# Patient Record
Sex: Male | Born: 1940
Health system: Southern US, Community
[De-identification: ages and names within clinical notes are randomized; demographics above are authoritative.]

## PROBLEM LIST (undated history)

## (undated) DIAGNOSIS — M1990 Unspecified osteoarthritis, unspecified site: Secondary | ICD-10-CM

## (undated) DIAGNOSIS — R519 Headache, unspecified: Secondary | ICD-10-CM

## (undated) DIAGNOSIS — I420 Dilated cardiomyopathy: Secondary | ICD-10-CM

## (undated) DIAGNOSIS — I480 Paroxysmal atrial fibrillation: Secondary | ICD-10-CM

## (undated) DIAGNOSIS — I340 Nonrheumatic mitral (valve) insufficiency: Secondary | ICD-10-CM

## (undated) DIAGNOSIS — R51 Headache: Secondary | ICD-10-CM

## (undated) DIAGNOSIS — I351 Nonrheumatic aortic (valve) insufficiency: Secondary | ICD-10-CM

## (undated) DIAGNOSIS — G459 Transient cerebral ischemic attack, unspecified: Secondary | ICD-10-CM

## (undated) DIAGNOSIS — K219 Gastro-esophageal reflux disease without esophagitis: Secondary | ICD-10-CM

## (undated) DIAGNOSIS — D508 Other iron deficiency anemias: Secondary | ICD-10-CM

## (undated) DIAGNOSIS — I491 Atrial premature depolarization: Secondary | ICD-10-CM

## (undated) DIAGNOSIS — I77810 Thoracic aortic ectasia: Secondary | ICD-10-CM

## (undated) DIAGNOSIS — I77819 Aortic ectasia, unspecified site: Secondary | ICD-10-CM

## (undated) HISTORY — PX: CATARACT EXTRACTION: SUR2

## (undated) HISTORY — PX: HERNIA REPAIR: SHX51

## (undated) HISTORY — DX: Nonrheumatic mitral (valve) insufficiency: I34.0

## (undated) HISTORY — DX: Dilated cardiomyopathy: I42.0

## (undated) HISTORY — DX: Gastro-esophageal reflux disease without esophagitis: K21.9

## (undated) HISTORY — DX: Transient cerebral ischemic attack, unspecified: G45.9

## (undated) HISTORY — DX: Aortic ectasia, unspecified site: I77.819

## (undated) HISTORY — DX: Other iron deficiency anemias: D50.8

## (undated) HISTORY — DX: Paroxysmal atrial fibrillation: I48.0

## (undated) HISTORY — DX: Thoracic aortic ectasia: I77.810

## (undated) HISTORY — DX: Nonrheumatic aortic (valve) insufficiency: I35.1

## (undated) HISTORY — PX: TONSILLECTOMY: SUR1361

---

## 2000-10-14 ENCOUNTER — Encounter: Payer: Self-pay | Admitting: Allergy and Immunology

## 2000-10-14 ENCOUNTER — Ambulatory Visit (HOSPITAL_COMMUNITY): Admission: RE | Admit: 2000-10-14 | Discharge: 2000-10-14 | Payer: Self-pay | Admitting: *Deleted

## 2004-02-06 ENCOUNTER — Emergency Department (HOSPITAL_COMMUNITY): Admission: EM | Admit: 2004-02-06 | Discharge: 2004-02-06 | Payer: Self-pay | Admitting: Family Medicine

## 2005-10-27 ENCOUNTER — Emergency Department (HOSPITAL_COMMUNITY): Admission: EM | Admit: 2005-10-27 | Discharge: 2005-10-27 | Payer: Self-pay | Admitting: Family Medicine

## 2005-11-06 ENCOUNTER — Emergency Department (HOSPITAL_COMMUNITY): Admission: EM | Admit: 2005-11-06 | Discharge: 2005-11-06 | Payer: Self-pay | Admitting: Family Medicine

## 2007-02-16 ENCOUNTER — Encounter: Admission: RE | Admit: 2007-02-16 | Discharge: 2007-02-16 | Payer: Self-pay | Admitting: Allergy and Immunology

## 2007-02-16 IMAGING — CR DG CHEST 2V
2 series · 2 of 2 positions shown · non-contrast
Comparison: none

CLINICAL DATA: Cough. 
 CHEST X-RAY: 
 Two views of the chest show the lungs to be clear.  The heart is within upper limits of normal.  No bony abnormality is seen.

[view not recorded (1 of 2)]
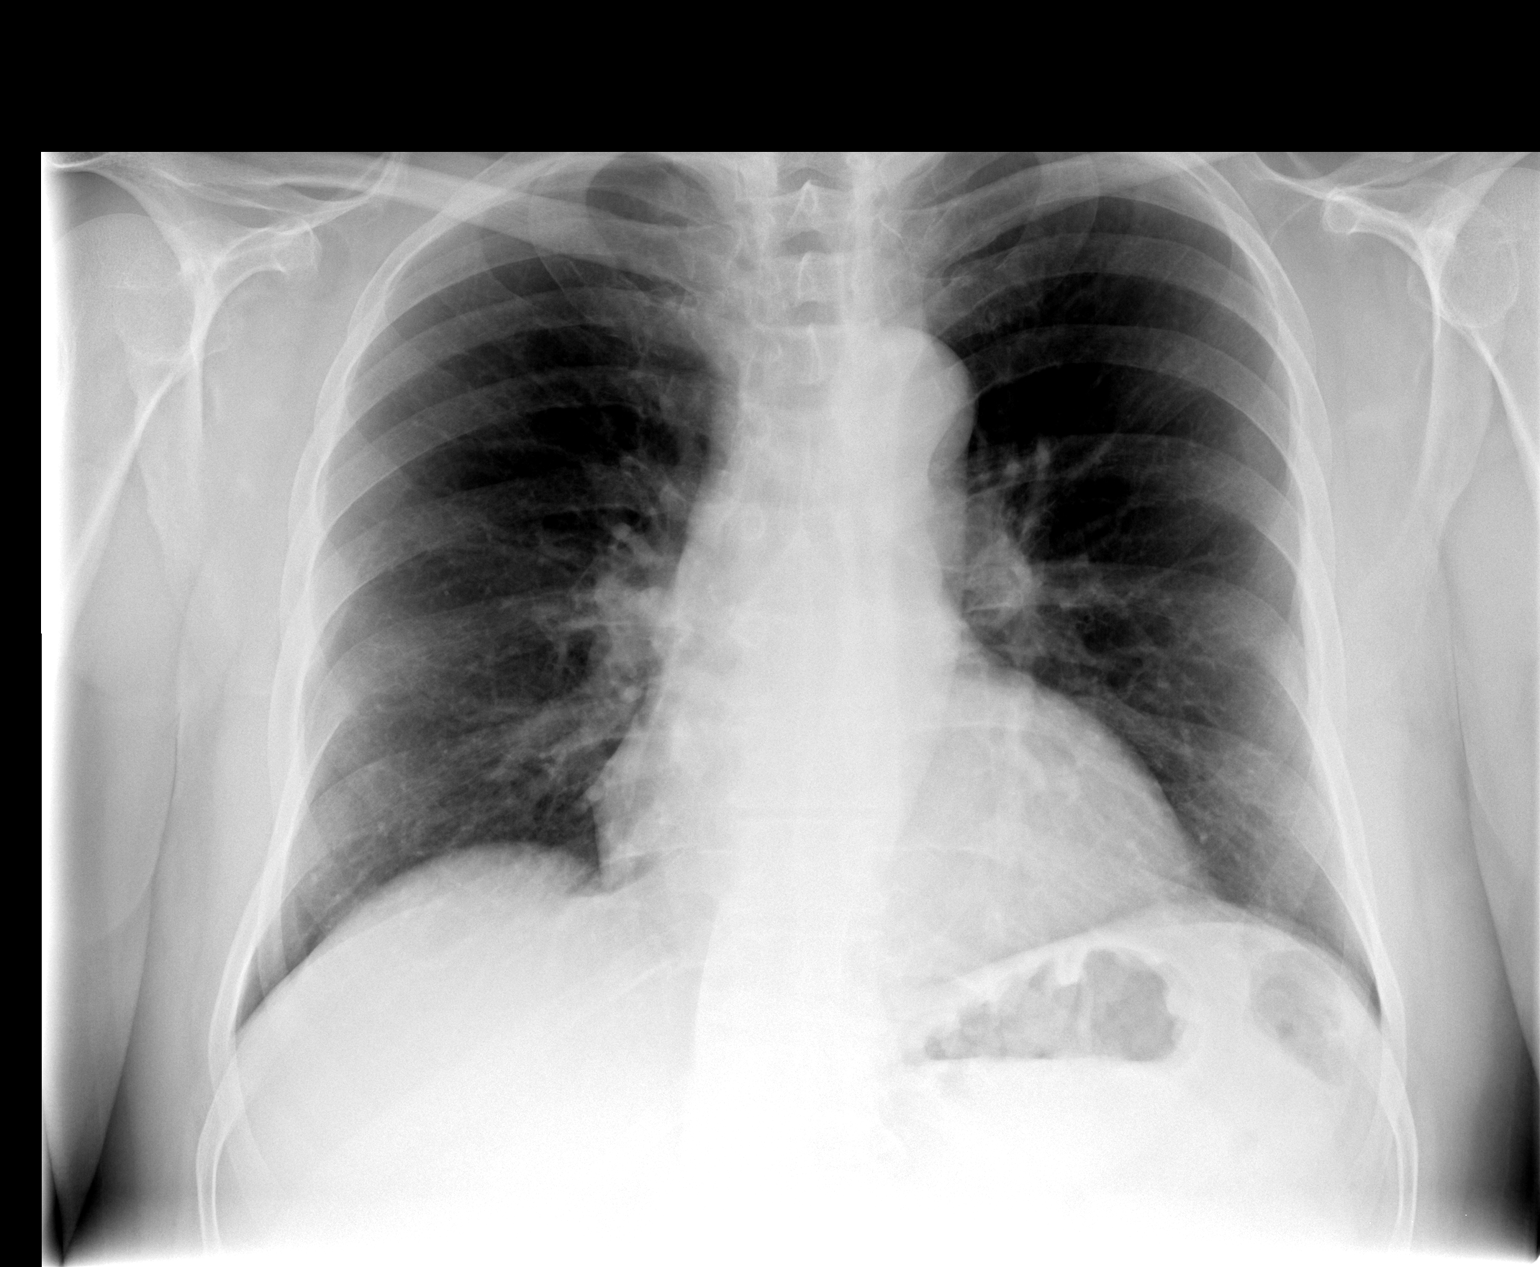

[view not recorded (2 of 2)]
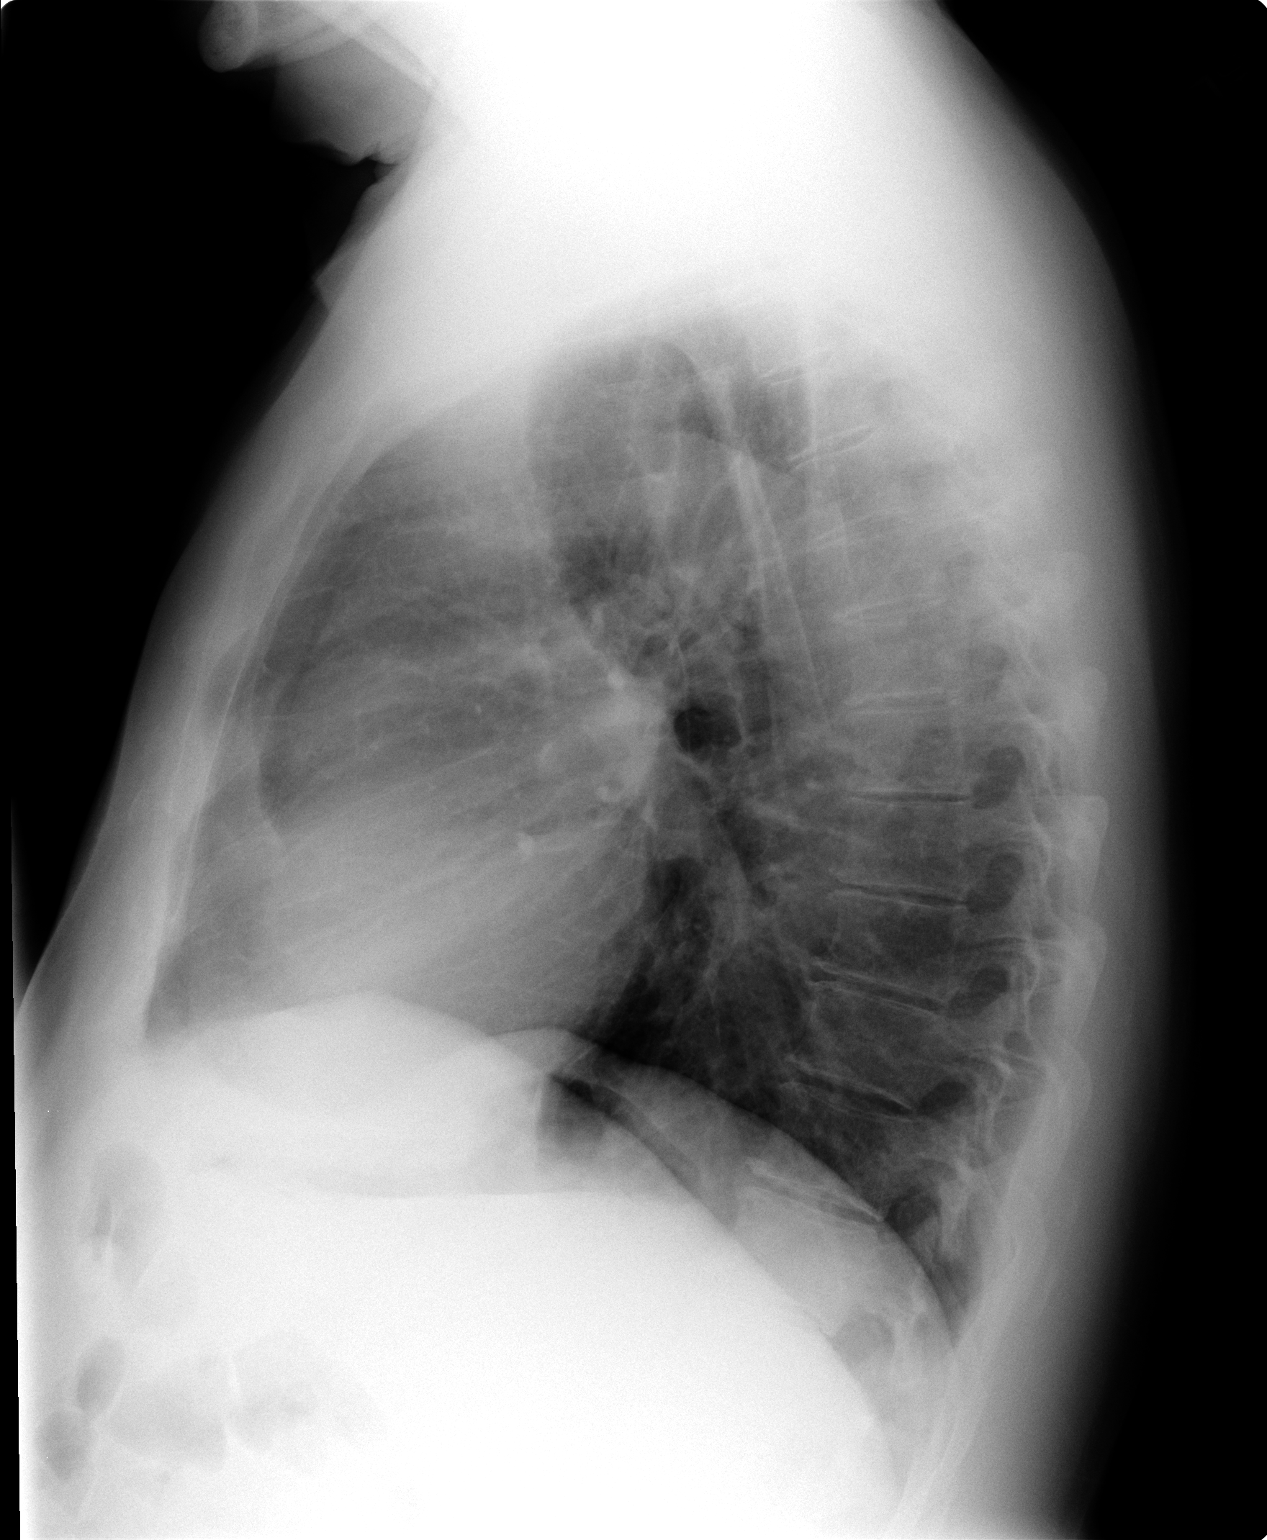

[2 of 2 positions shown; findings below may reference images not displayed]

IMPRESSION: No active lung disease.

## 2007-02-24 ENCOUNTER — Emergency Department (HOSPITAL_COMMUNITY): Admission: EM | Admit: 2007-02-24 | Discharge: 2007-02-24 | Payer: Self-pay | Admitting: Family Medicine

## 2007-11-15 ENCOUNTER — Emergency Department (HOSPITAL_COMMUNITY): Admission: EM | Admit: 2007-11-15 | Discharge: 2007-11-15 | Payer: Self-pay | Admitting: Family Medicine

## 2009-04-15 ENCOUNTER — Ambulatory Visit: Payer: Self-pay | Admitting: Oncology

## 2009-04-18 LAB — CBC & DIFF AND RETIC
Basophils Absolute: 0 10*3/uL (ref 0.0–0.1)
HCT: 25.9 % — ABNORMAL LOW (ref 38.4–49.9)
HGB: 7.1 g/dL — ABNORMAL LOW (ref 13.0–17.1)
Immature Retic Fract: 14.6 % — ABNORMAL HIGH (ref 0.00–13.40)
MCH: 16.5 pg — ABNORMAL LOW (ref 27.2–33.4)
MONO#: 0.4 10*3/uL (ref 0.1–0.9)
NEUT%: 62.4 % (ref 39.0–75.0)
Platelets: 145 10*3/uL (ref 140–400)
Retic Ct Abs: 29.31 10*3/uL (ref 24.10–77.50)
WBC: 4.7 10*3/uL (ref 4.0–10.3)
lymph#: 1.1 10*3/uL (ref 0.9–3.3)

## 2009-04-18 LAB — CHCC SMEAR

## 2009-04-18 LAB — MORPHOLOGY

## 2009-04-22 LAB — FOLATE: Folate: 11.9 ng/mL

## 2009-04-22 LAB — PROTEIN ELECTROPHORESIS, SERUM
Alpha-2-Globulin: 9.4 % (ref 7.1–11.8)
Beta 2: 5.1 % (ref 3.2–6.5)
Gamma Globulin: 12.8 % (ref 11.1–18.8)

## 2009-04-22 LAB — HEMOGLOBINOPATHY EVALUATION
Hemoglobin Other: 0 % (ref 0.0–0.0)
Hgb A2 Quant: 1.7 % — ABNORMAL LOW (ref 2.2–3.2)
Hgb F Quant: 0 % (ref 0.0–2.0)
Hgb S Quant: 0 % (ref 0.0–0.0)

## 2009-04-22 LAB — COMPREHENSIVE METABOLIC PANEL
ALT: <8 U/L (ref 0–53)
AST: 10 U/L (ref 0–37)
CO2: 23 mEq/L (ref 19–32)
Chloride: 107 mEq/L (ref 96–112)
Sodium: 141 mEq/L (ref 135–145)
Total Bilirubin: 0.7 mg/dL (ref 0.3–1.2)
Total Protein: 6.3 g/dL (ref 6.0–8.3)

## 2009-04-22 LAB — IRON AND TIBC
Iron: 12 ug/dL — ABNORMAL LOW (ref 42–165)
TIBC: 395 ug/dL (ref 215–435)
UIBC: 383 ug/dL

## 2009-04-22 LAB — DIRECT ANTIGLOBULIN TEST (NOT AT ARMC)
DAT (Complement): NEGATIVE
DAT IgG: NEGATIVE

## 2009-04-22 LAB — KAPPA/LAMBDA LIGHT CHAINS: Kappa free light chain: 1.41 mg/dL (ref 0.33–1.94)

## 2009-04-22 LAB — HAPTOGLOBIN: Haptoglobin: 119 mg/dL (ref 16–200)

## 2009-04-22 LAB — LACTATE DEHYDROGENASE: LDH: 117 U/L (ref 94–250)

## 2009-07-15 ENCOUNTER — Ambulatory Visit: Payer: Self-pay | Admitting: Oncology

## 2009-07-17 LAB — CBC WITH DIFFERENTIAL/PLATELET
BASO%: 0.7 % (ref 0.0–2.0)
EOS%: 3.4 % (ref 0.0–7.0)
HCT: 27.6 % — ABNORMAL LOW (ref 38.4–49.9)
MCH: 16.8 pg — ABNORMAL LOW (ref 27.2–33.4)
MCHC: 27.2 g/dL — ABNORMAL LOW (ref 32.0–36.0)
NEUT%: 65.5 % (ref 39.0–75.0)
RBC: 4.47 10*6/uL (ref 4.20–5.82)
lymph#: 1.4 10*3/uL (ref 0.9–3.3)
nRBC: 0 % (ref 0–0)

## 2009-09-23 ENCOUNTER — Emergency Department (HOSPITAL_COMMUNITY): Admission: EM | Admit: 2009-09-23 | Discharge: 2009-09-23 | Payer: Self-pay | Admitting: Family Medicine

## 2009-10-10 ENCOUNTER — Ambulatory Visit (HOSPITAL_COMMUNITY): Admission: RE | Admit: 2009-10-10 | Discharge: 2009-10-10 | Payer: Self-pay | Admitting: Gastroenterology

## 2009-10-21 ENCOUNTER — Ambulatory Visit: Payer: Self-pay | Admitting: Oncology

## 2009-10-22 ENCOUNTER — Ambulatory Visit (HOSPITAL_COMMUNITY): Admission: RE | Admit: 2009-10-22 | Discharge: 2009-10-22 | Payer: Self-pay | Admitting: Allergy and Immunology

## 2009-10-22 IMAGING — CR DG CHEST 2V
2 series · 2 of 2 positions shown · non-contrast
Comparison: [DATE] study.

CLINICAL DATA: History of coughing.  History of asthma.

CHEST - 2 VIEW

[w chest pa]
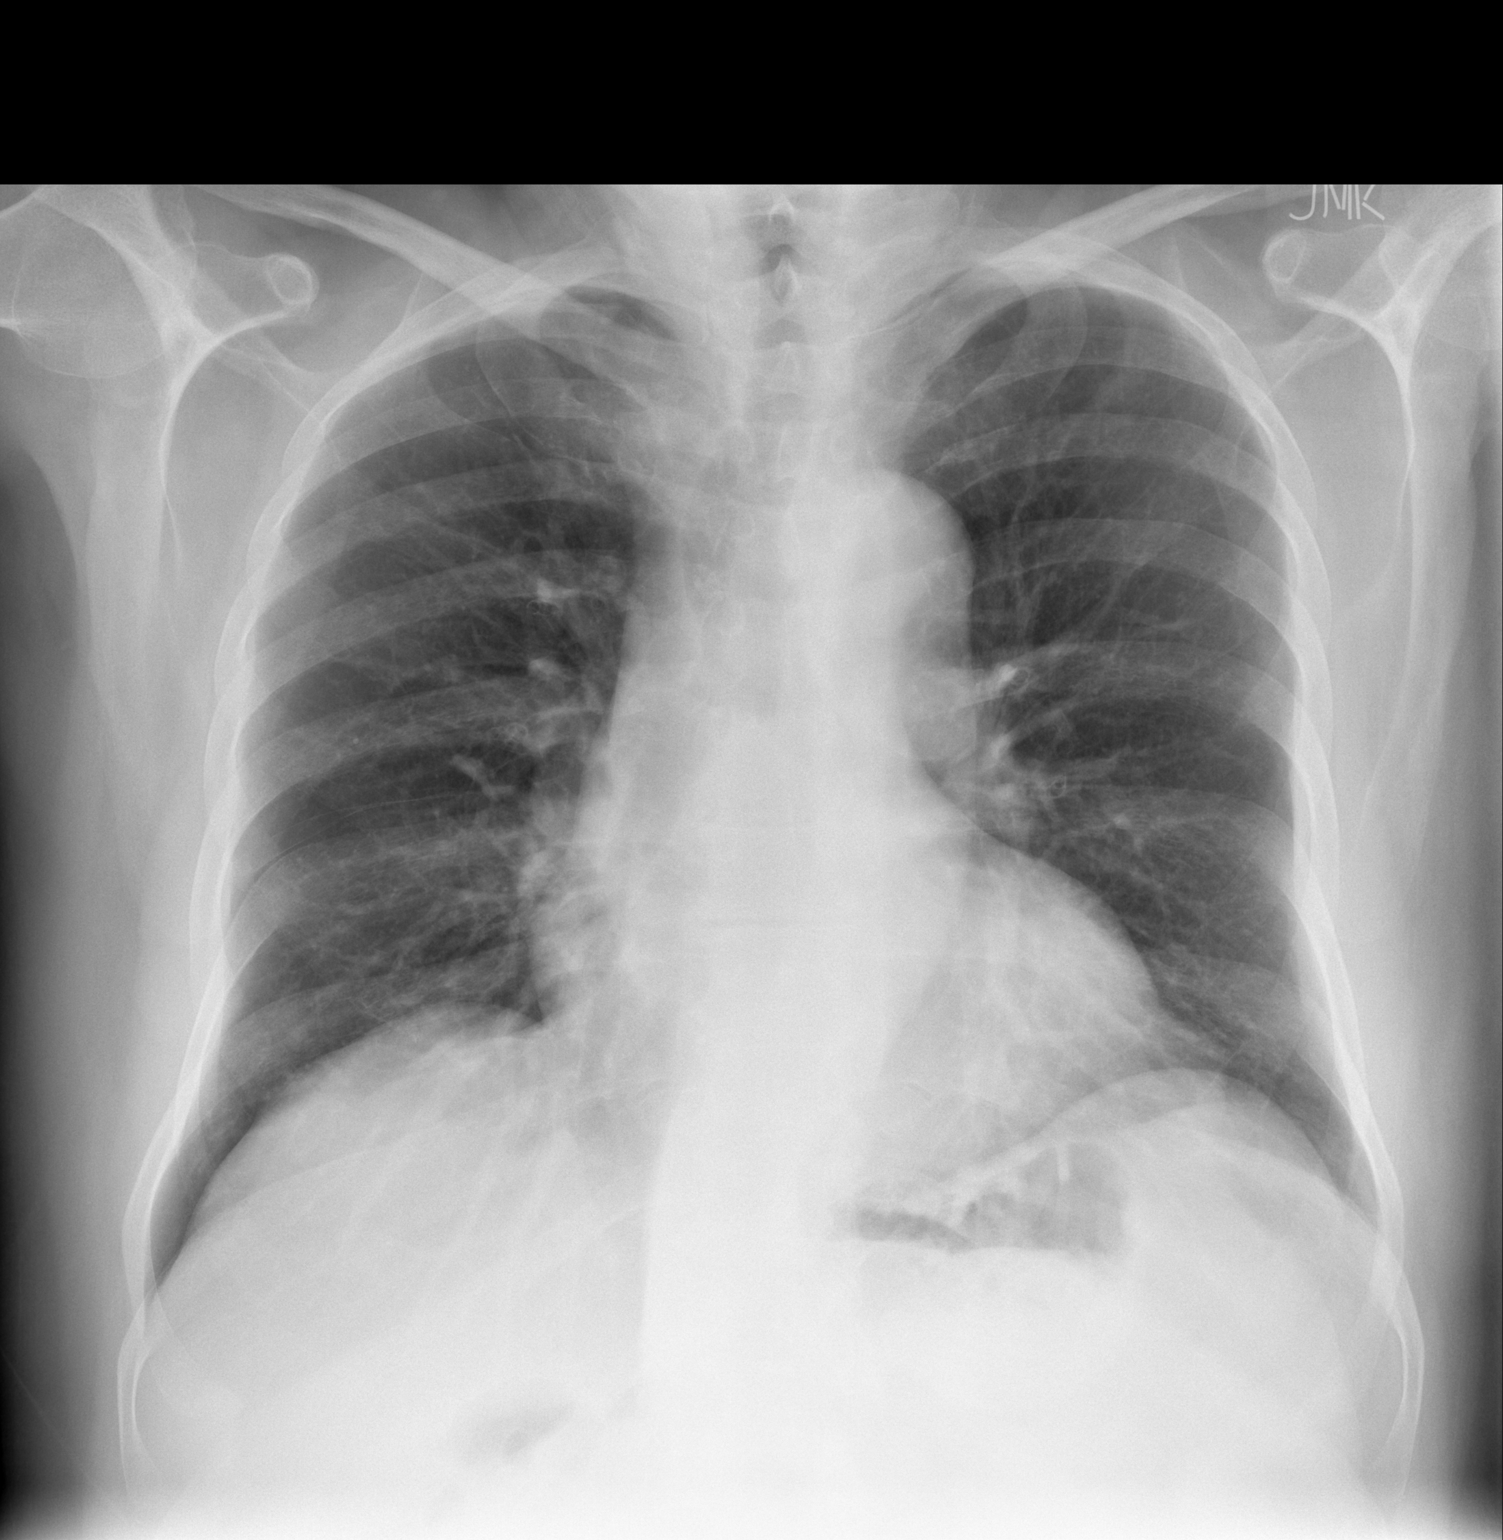

[w chest lat]
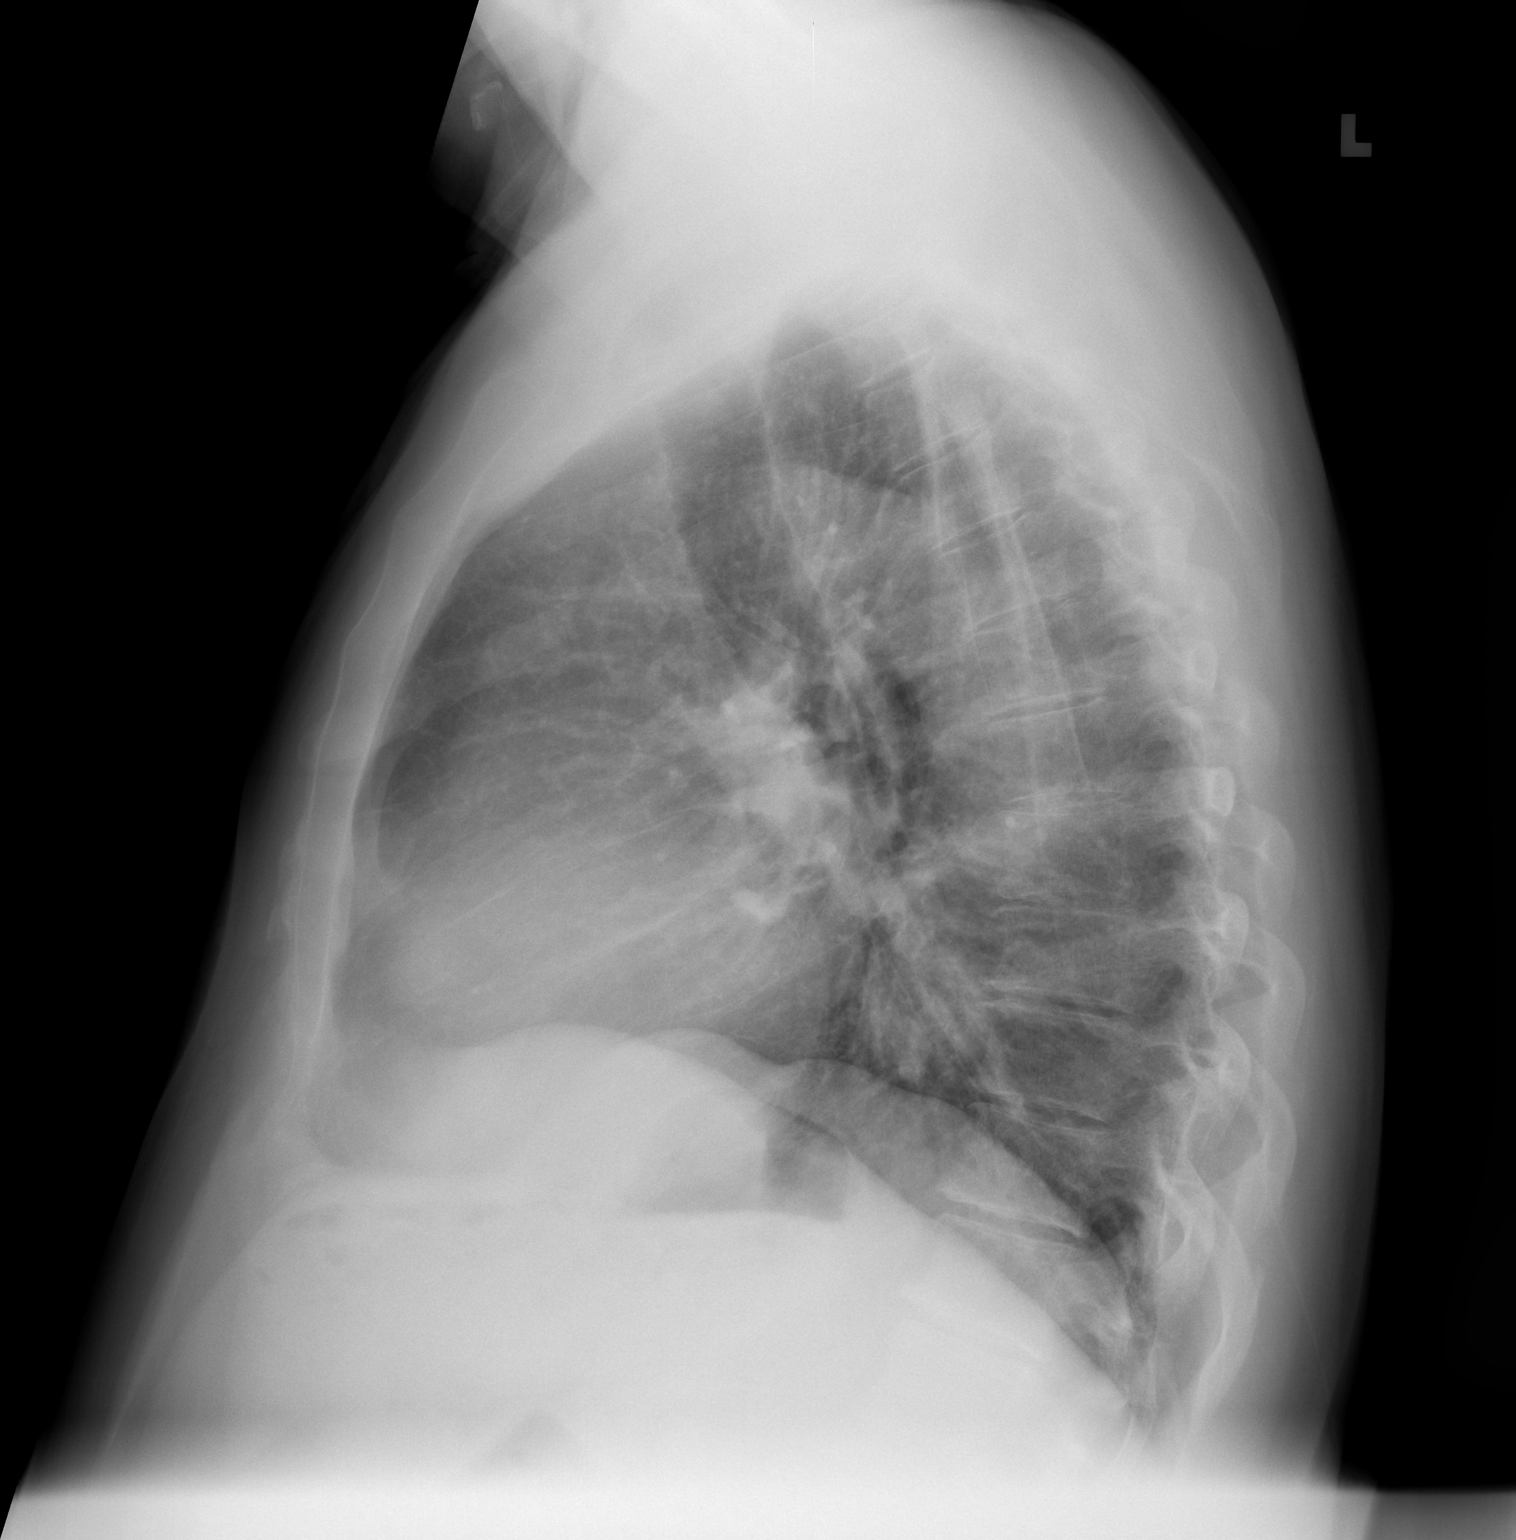

[2 of 2 positions shown; findings below may reference images not displayed]

FINDINGS: The cardiac silhouette is minimally enlarged. Ectasia and
nonaneurysmal calcification of the thoracic aorta is seen. The
lungs are well aerated and free of infiltrates. No pleural effusion
is evident. There is mild degenerative spondylosis compatible with
age.
IMPRESSION: The cardiac silhouette is minimally enlarged. No acute process is
identified.

## 2009-10-28 LAB — CBC WITH DIFFERENTIAL/PLATELET
Basophils Absolute: 0 10*3/uL (ref 0.0–0.1)
Eosinophils Absolute: 0 10*3/uL (ref 0.0–0.5)
HCT: 28.8 % — ABNORMAL LOW (ref 38.4–49.9)
HGB: 7.7 g/dL — ABNORMAL LOW (ref 13.0–17.1)
MONO#: 0.3 10*3/uL (ref 0.1–0.9)
NEUT#: 3.7 10*3/uL (ref 1.5–6.5)
NEUT%: 69.9 % (ref 39.0–75.0)
WBC: 5.2 10*3/uL (ref 4.0–10.3)
lymph#: 1.2 10*3/uL (ref 0.9–3.3)

## 2009-10-28 LAB — IRON AND TIBC: UIBC: 368 ug/dL

## 2009-10-28 LAB — TESTOSTERONE: Testosterone: 502.74 ng/dL (ref 350–890)

## 2010-02-18 ENCOUNTER — Ambulatory Visit: Payer: Self-pay | Admitting: Oncology

## 2010-02-19 LAB — CBC WITH DIFFERENTIAL/PLATELET
Basophils Absolute: 0 10*3/uL (ref 0.0–0.1)
EOS%: 4.4 % (ref 0.0–7.0)
HGB: 7.3 g/dL — ABNORMAL LOW (ref 13.0–17.1)
LYMPH%: 28.4 % (ref 14.0–49.0)
MCH: 17.1 pg — ABNORMAL LOW (ref 27.2–33.4)
MCV: 62.5 fL — ABNORMAL LOW (ref 79.3–98.0)
MONO%: 8.1 % (ref 0.0–14.0)
Platelets: 147 10*3/uL (ref 140–400)
RDW: 18.2 % — ABNORMAL HIGH (ref 11.0–14.6)

## 2010-02-19 LAB — FERRITIN: Ferritin: 2 ng/mL — ABNORMAL LOW (ref 22–322)

## 2010-02-19 LAB — IRON AND TIBC: Iron: <10 ug/dL — ABNORMAL LOW (ref 42–165)

## 2010-06-23 ENCOUNTER — Ambulatory Visit: Payer: Self-pay | Admitting: Oncology

## 2010-06-25 LAB — IRON AND TIBC: Iron: <10 ug/dL — ABNORMAL LOW (ref 42–165)

## 2010-06-25 LAB — CBC WITH DIFFERENTIAL/PLATELET
BASO%: 0.4 % (ref 0.0–2.0)
LYMPH%: 28.7 % (ref 14.0–49.0)
MCHC: 30.4 g/dL — ABNORMAL LOW (ref 32.0–36.0)
MONO#: 0.4 10*3/uL (ref 0.1–0.9)
RBC: 4.32 10*6/uL (ref 4.20–5.82)
WBC: 5 10*3/uL (ref 4.0–10.3)
lymph#: 1.4 10*3/uL (ref 0.9–3.3)

## 2010-10-13 ENCOUNTER — Ambulatory Visit: Payer: Self-pay | Admitting: Oncology

## 2010-10-15 LAB — CBC & DIFF AND RETIC
BASO%: 0.3 % (ref 0.0–2.0)
Basophils Absolute: 0 10*3/uL (ref 0.0–0.1)
EOS%: 4.7 % (ref 0.0–7.0)
Eosinophils Absolute: 0.3 10*3/uL (ref 0.0–0.5)
HCT: 31.8 % — ABNORMAL LOW (ref 38.4–49.9)
HGB: 9.1 g/dL — ABNORMAL LOW (ref 13.0–17.1)
Immature Retic Fract: 18.7 % — ABNORMAL HIGH (ref 0.00–13.40)
LYMPH%: 24.8 % (ref 14.0–49.0)
MCH: 18.3 pg — ABNORMAL LOW (ref 27.2–33.4)
MCHC: 28.6 g/dL — ABNORMAL LOW (ref 32.0–36.0)
MCV: 63.9 fL — ABNORMAL LOW (ref 79.3–98.0)
MONO#: 0.4 10*3/uL (ref 0.1–0.9)
MONO%: 6.4 % (ref 0.0–14.0)
NEUT#: 3.7 10*3/uL (ref 1.5–6.5)
NEUT%: 63.8 % (ref 39.0–75.0)
Platelets: 144 10*3/uL (ref 140–400)
RBC: 4.98 10*6/uL (ref 4.20–5.82)
RDW: 18.9 % — ABNORMAL HIGH (ref 11.0–14.6)
Retic %: 0.8 % (ref 0.50–1.60)
Retic Ct Abs: 39.84 10*3/uL (ref 24.10–77.50)
WBC: 5.8 10*3/uL (ref 4.0–10.3)
lymph#: 1.4 10*3/uL (ref 0.9–3.3)

## 2010-10-15 LAB — COMPREHENSIVE METABOLIC PANEL
ALT: 17 U/L (ref 0–53)
AST: 16 U/L (ref 0–37)
Albumin: 4 g/dL (ref 3.5–5.2)
Alkaline Phosphatase: 81 U/L (ref 39–117)
BUN: 11 mg/dL (ref 6–23)
CO2: 22 mEq/L (ref 19–32)
Calcium: 8.9 mg/dL (ref 8.4–10.5)
Chloride: 105 mEq/L (ref 96–112)
Creatinine, Ser: 0.89 mg/dL (ref 0.40–1.50)
Glucose, Bld: 117 mg/dL — ABNORMAL HIGH (ref 70–99)
Potassium: 3.4 mEq/L — ABNORMAL LOW (ref 3.5–5.3)
Sodium: 139 mEq/L (ref 135–145)
Total Bilirubin: 0.6 mg/dL (ref 0.3–1.2)
Total Protein: 6.6 g/dL (ref 6.0–8.3)

## 2010-10-15 LAB — MORPHOLOGY: PLT EST: ADEQUATE

## 2010-10-15 LAB — LACTATE DEHYDROGENASE: LDH: 126 U/L (ref 94–250)

## 2010-10-15 LAB — FERRITIN: Ferritin: 2 ng/mL — ABNORMAL LOW (ref 22–322)

## 2010-10-15 LAB — CHCC SMEAR

## 2010-10-15 LAB — IRON AND TIBC
TIBC: 400 ug/dL (ref 215–435)
UIBC: 388 ug/dL

## 2010-12-08 LAB — HEMOGLOBIN AND HEMATOCRIT, BLOOD: Hemoglobin: 7.3 g/dL — ABNORMAL LOW (ref 13.0–17.0)

## 2011-02-17 ENCOUNTER — Other Ambulatory Visit: Payer: Self-pay | Admitting: Oncology

## 2011-02-17 ENCOUNTER — Encounter (HOSPITAL_BASED_OUTPATIENT_CLINIC_OR_DEPARTMENT_OTHER): Payer: 59 | Admitting: Oncology

## 2011-02-17 DIAGNOSIS — D509 Iron deficiency anemia, unspecified: Secondary | ICD-10-CM

## 2011-02-17 LAB — CBC WITH DIFFERENTIAL/PLATELET
BASO%: 0.7 % (ref 0.0–2.0)
Eosinophils Absolute: 0.3 10*3/uL (ref 0.0–0.5)
MCHC: 30.7 g/dL — ABNORMAL LOW (ref 32.0–36.0)
MCV: 66 fL — ABNORMAL LOW (ref 79.3–98.0)
MONO#: 0.5 10*3/uL (ref 0.1–0.9)
MONO%: 7.4 % (ref 0.0–14.0)
NEUT#: 4.1 10*3/uL (ref 1.5–6.5)
RBC: 4.7 10*6/uL (ref 4.20–5.82)
RDW: 20.1 % — ABNORMAL HIGH (ref 11.0–14.6)
WBC: 6.6 10*3/uL (ref 4.0–10.3)

## 2011-02-17 LAB — IRON AND TIBC
Iron: 15 ug/dL — ABNORMAL LOW (ref 42–165)
UIBC: 370 ug/dL

## 2011-02-17 LAB — FERRITIN: Ferritin: 5 ng/mL — ABNORMAL LOW (ref 22–322)

## 2011-06-22 ENCOUNTER — Encounter (HOSPITAL_BASED_OUTPATIENT_CLINIC_OR_DEPARTMENT_OTHER): Payer: 59 | Admitting: Oncology

## 2011-06-22 ENCOUNTER — Other Ambulatory Visit: Payer: Self-pay | Admitting: Oncology

## 2011-06-22 DIAGNOSIS — D509 Iron deficiency anemia, unspecified: Secondary | ICD-10-CM

## 2011-06-22 DIAGNOSIS — D508 Other iron deficiency anemias: Secondary | ICD-10-CM

## 2011-06-22 LAB — CBC WITH DIFFERENTIAL/PLATELET
Basophils Absolute: 0 10*3/uL (ref 0.0–0.1)
Eosinophils Absolute: 0.2 10*3/uL (ref 0.0–0.5)
HGB: 10.7 g/dL — ABNORMAL LOW (ref 13.0–17.1)
LYMPH%: 24.3 % (ref 14.0–49.0)
MCV: 68.8 fL — ABNORMAL LOW (ref 79.3–98.0)
MONO%: 7.5 % (ref 0.0–14.0)
NEUT#: 3.8 10*3/uL (ref 1.5–6.5)
NEUT%: 63.9 % (ref 39.0–75.0)
Platelets: 164 10*3/uL (ref 140–400)
RBC: 4.89 10*6/uL (ref 4.20–5.82)

## 2011-06-22 LAB — IRON AND TIBC: %SAT: 5 % — ABNORMAL LOW (ref 20–55)

## 2011-07-03 ENCOUNTER — Encounter: Payer: Self-pay | Admitting: Oncology

## 2011-07-03 DIAGNOSIS — D508 Other iron deficiency anemias: Secondary | ICD-10-CM | POA: Insufficient documentation

## 2011-10-14 ENCOUNTER — Telehealth: Payer: Self-pay

## 2011-10-14 ENCOUNTER — Other Ambulatory Visit (HOSPITAL_BASED_OUTPATIENT_CLINIC_OR_DEPARTMENT_OTHER): Payer: 59 | Admitting: Lab

## 2011-10-14 DIAGNOSIS — D509 Iron deficiency anemia, unspecified: Secondary | ICD-10-CM

## 2011-10-14 LAB — CBC WITH DIFFERENTIAL/PLATELET
BASO%: 0.6 % (ref 0.0–2.0)
Basophils Absolute: 0 10*3/uL (ref 0.0–0.1)
HCT: 33.8 % — ABNORMAL LOW (ref 38.4–49.9)
HGB: 10.9 g/dL — ABNORMAL LOW (ref 13.0–17.1)
MONO#: 0.4 10*3/uL (ref 0.1–0.9)
NEUT%: 65.6 % (ref 39.0–75.0)
WBC: 5.9 10*3/uL (ref 4.0–10.3)
lymph#: 1.4 10*3/uL (ref 0.9–3.3)

## 2011-10-14 LAB — IRON AND TIBC
%SAT: 3 % — ABNORMAL LOW (ref 20–55)
TIBC: 396 ug/dL (ref 215–435)

## 2011-10-14 NOTE — Progress Notes (Unsigned)
Schedulers to call patient to schedule future appointments.

## 2011-10-14 NOTE — Telephone Encounter (Signed)
Called patient to let him know that Hgb is improving and stable, per Dr. Gaylyn Rong; patient verbalized understanding.

## 2011-11-20 ENCOUNTER — Other Ambulatory Visit: Payer: Self-pay | Admitting: *Deleted

## 2011-11-23 ENCOUNTER — Telehealth: Payer: Self-pay | Admitting: Oncology

## 2011-11-23 NOTE — Telephone Encounter (Signed)
Pt already on schedule for lb/fu in may. Checked w/desk nurse (cameo) on 3/1 to see if pt need lb 3/1 due to pt had lb drawn in jan 2013 already. Per cameo no need to schedule march lb.

## 2012-02-10 ENCOUNTER — Other Ambulatory Visit (HOSPITAL_BASED_OUTPATIENT_CLINIC_OR_DEPARTMENT_OTHER): Payer: 59

## 2012-02-10 ENCOUNTER — Encounter: Payer: Self-pay | Admitting: Oncology

## 2012-02-10 ENCOUNTER — Ambulatory Visit (HOSPITAL_BASED_OUTPATIENT_CLINIC_OR_DEPARTMENT_OTHER): Payer: 59 | Admitting: Oncology

## 2012-02-10 VITALS — BP 138/86 | HR 70 | Temp 97.1°F | Ht 67.0 in | Wt 203.0 lb

## 2012-02-10 DIAGNOSIS — D509 Iron deficiency anemia, unspecified: Secondary | ICD-10-CM

## 2012-02-10 DIAGNOSIS — D508 Other iron deficiency anemias: Secondary | ICD-10-CM

## 2012-02-10 DIAGNOSIS — D649 Anemia, unspecified: Secondary | ICD-10-CM

## 2012-02-10 LAB — CBC WITH DIFFERENTIAL/PLATELET
BASO%: 1 % (ref 0.0–2.0)
EOS%: 4.7 % (ref 0.0–7.0)
LYMPH%: 24.9 % (ref 14.0–49.0)
MCH: 21.7 pg — ABNORMAL LOW (ref 27.2–33.4)
MCHC: 31.1 g/dL — ABNORMAL LOW (ref 32.0–36.0)
MCV: 69.7 fL — ABNORMAL LOW (ref 79.3–98.0)
MONO%: 8.2 % (ref 0.0–14.0)
Platelets: 151 10*3/uL (ref 140–400)
RBC: 4.8 10*6/uL (ref 4.20–5.82)
RDW: 17.8 % — ABNORMAL HIGH (ref 11.0–14.6)
nRBC: 0 % (ref 0–0)

## 2012-02-10 LAB — IRON AND TIBC
%SAT: 4 % — ABNORMAL LOW (ref 20–55)
Iron: 13 ug/dL — ABNORMAL LOW (ref 42–165)

## 2012-02-10 LAB — COMPREHENSIVE METABOLIC PANEL
ALT: 18 U/L (ref 0–53)
AST: 17 U/L (ref 0–37)
Alkaline Phosphatase: 75 U/L (ref 39–117)
CO2: 23 mEq/L (ref 19–32)
Creatinine, Ser: 0.94 mg/dL (ref 0.50–1.35)
Sodium: 140 mEq/L (ref 135–145)
Total Bilirubin: 0.6 mg/dL (ref 0.3–1.2)
Total Protein: 6.2 g/dL (ref 6.0–8.3)

## 2012-02-10 LAB — FERRITIN: Ferritin: 5 ng/mL — ABNORMAL LOW (ref 22–322)

## 2012-02-10 NOTE — Progress Notes (Signed)
[facility]  [phone] Fax:([phone]   OFFICE PROGRESS NOTE   Cc:  Benita Stabile, MD, MD  DIAGNOSIS:   Iron deficiency anemia from most likely poor absorption.  Patient reported negative EGD and colonoscopy by Dr. Matthias Hughs in 2012.   CURRENT THERAPY:  Oral iron.   INTERVAL HISTORY: Tony Roberts 71 y.o. male returns for regular follow up.  He reports feeling well.  He denies any visible source of bleeding.  He denies fatigue, abdominal pain, anorexia, weight loss.  He only takes Niferex about 2-3x/week due to more hypertension and constipation if he takes it daily.   Patient denies fatigue, headache, visual changes, confusion, drenching night sweats, palpable lymph node swelling, mucositis, odynophagia, dysphagia, nausea vomiting, jaundice, chest pain, palpitation, shortness of breath, dyspnea on exertion, productive cough, gum bleeding, epistaxis, hematemesis, hemoptysis, abdominal pain, abdominal swelling, early satiety, melena, hematochezia, hematuria, skin rash, spontaneous bleeding, joint swelling, joint pain, heat or cold intolerance, bowel bladder incontinence, back pain, focal motor weakness, paresthesia, depression, suicidal or homocidal ideation, feeling hopelessness.   Past Medical History  Diagnosis Date  . Other specified iron deficiency anemias   . GERD (gastroesophageal reflux disease)   . Asthma     No past surgical history on file.  Current Outpatient Prescriptions  Medication Sig Dispense Refill  . esomeprazole (NEXIUM) 40 MG capsule Take 40 mg by mouth daily before breakfast.      . Fluticasone-Salmeterol (ADVAIR) 250-50 MCG/DOSE AEPB Inhale 1 puff into the lungs daily.      . iron polysaccharides (NIFEREX) 150 MG capsule Take 150 mg by mouth daily.      . montelukast (SINGULAIR) 10 MG tablet Take 10 mg by mouth at bedtime.        ALLERGIES:   has no known allergies.  REVIEW OF SYSTEMS:  The rest of the 14-point  review of system was negative.   Filed Vitals:   02/10/12 1322  BP: 138/86  Pulse: 70  Temp: 97.1 F (36.2 C)   Wt Readings from Last 3 Encounters:  02/10/12 203 lb (92.08 kg)   ECOG Performance status: 0  PHYSICAL EXAMINATION:   General:  well-nourished in no acute distress.  Eyes:  no scleral icterus.  ENT:  There were no oropharyngeal lesions.  Neck was without thyromegaly.  Lymphatics:  Negative cervical, supraclavicular or axillary adenopathy.  Respiratory: lungs were clear bilaterally without wheezing or crackles.  Cardiovascular:  Regular rate and rhythm, S1/S2, without murmur, rub or gallop.  There was no pedal edema.  GI:  abdomen was soft, flat, nontender, nondistended, without organomegaly.  Muscoloskeletal:  no spinal tenderness of palpation of vertebral spine.  Skin exam was without echymosis, petichae.  Neuro exam was nonfocal.  Patient was able to get on and off exam table without assistance.  Gait was normal.  Patient was alerted and oriented.  Attention was good.   Language was appropriate.  Mood was normal without depression.  Speech was not pressured.  Thought content was not tangential.     LABORATORY/RADIOLOGY DATA:  Lab Results  Component Value Date   WBC 5.1 02/10/2012   HGB 10.4* 02/10/2012   HCT 33.5* 02/10/2012   PLT 151 02/10/2012   GLUCOSE 117* 10/15/2010   ALKPHOS 81 10/15/2010   ALT 17 10/15/2010   AST 16 10/15/2010   NA 139 10/15/2010   K 3.4* 10/15/2010   CL 105 10/15/2010   CREATININE 0.89 10/15/2010   BUN 11 10/15/2010  CO2 22 10/15/2010     ASSESSMENT AND PLAN:   1.  Iron deficiency anemia:  Negative GI eval.  Most likely due to poor GI absorption.  With oral iron on board (albeit that he does not take it daily), his Hgb has improved compared to 2 years ago.  I advised him to continue this.  2.  Follow up:  As his Hgb has been stable lately; he inquired about being discharged from the Clinic here.  I advised him to follow up with Dr. Clovis Riley and have  about q52month CBC check to ensure that his Hgb is stable.  In the future, if his Hgb significantly decreases or he has severe iron deficiency anemia despite being adherent to oral iron, he may be referred back for consideration of IV iron.  Thank you for this referral.   The length of time of the face-to-face encounter was 10 minutes. More than 50% of time was spent counseling and coordination of care.

## 2012-02-11 ENCOUNTER — Encounter: Payer: Self-pay | Admitting: *Deleted

## 2012-02-11 NOTE — Progress Notes (Signed)
CBC results from 02/10/12 mailed to pt's home.  He had asked for a copy on his office visit and this RN forgot to give it to him.

## 2012-05-26 ENCOUNTER — Encounter: Payer: Self-pay | Admitting: Sports Medicine

## 2012-05-26 ENCOUNTER — Ambulatory Visit (INDEPENDENT_AMBULATORY_CARE_PROVIDER_SITE_OTHER): Payer: 59 | Admitting: Sports Medicine

## 2012-05-26 VITALS — BP 143/76 | HR 73 | Ht 67.0 in | Wt 190.0 lb

## 2012-05-26 DIAGNOSIS — IMO0002 Reserved for concepts with insufficient information to code with codable children: Secondary | ICD-10-CM | POA: Insufficient documentation

## 2012-05-26 DIAGNOSIS — S76319A Strain of muscle, fascia and tendon of the posterior muscle group at thigh level, unspecified thigh, initial encounter: Secondary | ICD-10-CM

## 2012-05-27 NOTE — Progress Notes (Signed)
  Subjective:    Patient ID: Tony Roberts, male    DOB: [birth year], 71 y.o.   MRN: [id number]  HPI Tony Roberts is a very pleasant 71 year old male that comes in today complaining of left hamstring pain. He had an acute injury on August 21 while bowling. 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 As he slid forward on his left leg to release the ball he felt a pop in his left hamstring. This was accompanied by both swelling and ecchymosis. He also noticed a "knot" in the mid substance of the left hamstring. He took a few days off and his symptoms improved. When he tried to return to bowling a few days ago he felt this area pull once again. No problems with his hamstrings in the past. No groin pain. No associated numbness or tingling.  His medical history is positive for iron deficiency anemia, asthma, and reflux disease Review of his chart shows him to take Nexium, Advair, supplemental iron, and Singulair. He has no known drug allergies He is a nonsmoker    Review of Systems     Objective:   Physical Exam Well-developed, well-nourished. No acute distress. Awake alert and oriented x3  Left hamstring: He is tender to palpation in the mid substance of the hamstring tendon but no palpable defect is appreciated. He has a slight amount of residual ecchymosis in the distal hamstring and popliteal fossa. No significant soft tissue swell. Origin is intact at the ishial tuberosity. No gross deformity of the hamstring muscle. 5/5 strength with resistance at both 30 and 90 of knee flexion. Smooth painless hip range of motion with a negative log roll Neurovascularly intact distally. Walks with only a slight limp.  MSK ultrasound: Images of the left hamstring in both the transverse and longitudinal plane were obtained. There is edema diffusely through the semi-tendinosis muscle which extends into the musculotendinous junction. I do not see any discrete tear nor hematoma  formation.       Assessment & Plan:  1. Left hamstring strain, likely grade 2  I had a long talk with the patient regarding his injury. He understands that it may take several weeks for him to return to bowling. I've given him a body helix  compression sleeve and he will start an eccentric hamstring protocol at his gym. He enjoys biking and he can certainly continue with this. He will followup with me in 4 weeks for reevaluation. We will plan on repeating his ultrasound at that visit. Call with questions or concerns in the interim.

## 2012-06-23 ENCOUNTER — Ambulatory Visit (INDEPENDENT_AMBULATORY_CARE_PROVIDER_SITE_OTHER): Payer: 59 | Admitting: Sports Medicine

## 2012-06-23 VITALS — BP 124/70 | Ht 67.0 in | Wt 188.0 lb

## 2012-06-23 DIAGNOSIS — IMO0002 Reserved for concepts with insufficient information to code with codable children: Secondary | ICD-10-CM

## 2012-06-23 DIAGNOSIS — S76319A Strain of muscle, fascia and tendon of the posterior muscle group at thigh level, unspecified thigh, initial encounter: Secondary | ICD-10-CM

## 2012-06-23 NOTE — Progress Notes (Signed)
  Subjective:    Patient ID: Tony Roberts, male    DOB: 1941/09/03, 71 y.o.   MRN: [id number]  HPI Pt states that he feels back to 100% at L hamstring, but is currently experiencing discomfort at R hamstring.  He has not been bowling at all since last visit.  He does his prescribed exercises 2-3 per week along with his workout for the day.  States that there are no alleviating factors to his R hamstring discomfort.  MSK ultrasound of the left hamstring shows improvement in the edema seen on his previous scan. Still with a little bit of hypoechogenicity in the distal semitendinosis but improved from his previous scan.   Review of Systems All negative except HPI    Objective:   Physical Exam Well-developed and well-nourished. No acute distress.  Left hamstring shows no tenderness to palpation and no palpable defect. No soft tissue swelling. 5/5 strength with hamstring flexion both at 30 and 90. Right hamstring shows slight tenderness to palpation at the origin of the initial tuberosity. No palpable defect. 5/5 strength with hamstring flexion both at 30 and 90. Neurovascular intact distally       Assessment & Plan:  1. Resolving left hamstring tendon strain with new right-sided insertional hamstring tendon strain  1. Continue eccentric exercises bilaterally 2. No bowling for another 2 weeks, then may play every 3 days, then every 2 days, with gradual increase in number of games. 3. patient will continue with his body helix by sleeve for his left hamstring and a new body helix sleeve was provided for the right hamstring. 4. Patient understands that these types of injuries can be slow to heal. He understands that if he has returning pain with increasing activity, he will need to rest a little longer. He also understands the importance of continuing with his hamstring eccentric strengthening. As long as he is able to return to his previous level of activity without problems, he can  followup when necessary.

## 2012-09-26 ENCOUNTER — Ambulatory Visit (INDEPENDENT_AMBULATORY_CARE_PROVIDER_SITE_OTHER): Payer: 59 | Admitting: Sports Medicine

## 2012-09-26 VITALS — BP 142/91 | Ht 67.0 in | Wt 189.0 lb

## 2012-09-26 DIAGNOSIS — S79919A Unspecified injury of unspecified hip, initial encounter: Secondary | ICD-10-CM

## 2012-09-26 DIAGNOSIS — S76309A Unspecified injury of muscle, fascia and tendon of the posterior muscle group at thigh level, unspecified thigh, initial encounter: Secondary | ICD-10-CM

## 2012-09-26 LAB — CBC
MCH: 21.7 pg — ABNORMAL LOW (ref 26.0–34.0)
MCHC: 30.9 g/dL (ref 30.0–36.0)
MCV: 70 fL — ABNORMAL LOW (ref 78.0–100.0)
Platelets: 173 10*3/uL (ref 150–400)
RDW: 17.2 % — ABNORMAL HIGH (ref 11.5–15.5)

## 2012-09-26 LAB — COMPREHENSIVE METABOLIC PANEL
ALT: 11 U/L (ref 0–53)
AST: 16 U/L (ref 0–37)
Calcium: 9 mg/dL (ref 8.4–10.5)
Chloride: 103 mEq/L (ref 96–112)
Creat: 0.95 mg/dL (ref 0.50–1.35)
Total Bilirubin: 0.6 mg/dL (ref 0.3–1.2)

## 2012-09-27 ENCOUNTER — Telehealth: Payer: Self-pay | Admitting: Sports Medicine

## 2012-09-27 NOTE — Progress Notes (Signed)
  Subjective:    Patient ID: Tony Roberts, male    DOB: 12/29/1940, 72 y.o.   MRN: [id number]  HPI chief complaint: Bilateral hamstring pain  Patient comes in today with bilateral hamstring pain. He was treated several months ago for a proximal left hamstring strain with compression, relative rest, and eccentric strengthening exercises. He had return to bowling and was doing well up until December 14. At that time he experienced a slight reinjury to the left hamstring. This time, his pain is a little more distal just proximal to the knee. He's also experiencing similar discomfort in the right hamstring but in a little different location. He has not noticed any swelling. No bruising. He has not bowled for the past 3 weeks so his symptoms have improved. He is also complaining of frequent cramping in his legs.   Medications include Nexium, Singulair, and Advair    Review of Systems     Objective:   Physical Exam Well-developed, well-nourished. No acute distress.  Left hamstring: Tenderness to palpation along the medial hamstring just proximal to the knee. No palpable defect. No soft tissue swelling. No ecchymosis. No tenderness at the ischial tuberosity. Reproducible pain with resisted hamstring contraction, but good strength at both 20 and 90 of knee flexion. Right hamstring: There is tenderness to palpation at the distal biceps femoris. No soft tissue swelling. No palpable defect. No ecchymosis. Good strength at both 20 and 90 of knee flexion but this does reproduce pain as well some cramping. Neurovascularly intact distally. Walking without a limp.       Assessment & Plan:  1. Mild bilateral hamstring strains  Patient injury is not as severe as what he presented with several months ago. I've given him 2 new body helix compression sleeves and I have given him a prescription for physical therapy with Ellamae Sia. I think he is safe to wean back into bowling, but he needs to be  careful to not overdo it. Patient is concerned about his leg cramping so I'll order some blood work to include a CBC, CMP, sedimentation rate, CK, and TSH. I will call him with those results once available.

## 2012-09-27 NOTE — Telephone Encounter (Signed)
I spoke with Tony Roberts today on the phone regarding his blood. Overall, things look good. He is slightly anemic at 11.2 but this appears to be his baseline. He has had an extensive workup for anemia in the past and ultimately wound up on supplemental iron. His CK and sedimentation rate are both within normal limits. I recommended that he work with Ellamae Sia and wean back into bowling as we discussed yesterday. He will followup for ongoing or recalcitrant issues.

## 2013-10-27 ENCOUNTER — Encounter: Payer: Self-pay | Admitting: Family Medicine

## 2013-10-27 ENCOUNTER — Ambulatory Visit (INDEPENDENT_AMBULATORY_CARE_PROVIDER_SITE_OTHER): Payer: 59 | Admitting: Family Medicine

## 2013-10-27 ENCOUNTER — Ambulatory Visit (HOSPITAL_COMMUNITY)
Admission: RE | Admit: 2013-10-27 | Discharge: 2013-10-27 | Disposition: A | Discharge status: Home / Self Care | Payer: 59 | Source: Ambulatory Visit | Attending: Family Medicine | Admitting: Family Medicine

## 2013-10-27 VITALS — BP 137/79 | HR 76 | Ht 67.0 in | Wt 189.0 lb

## 2013-10-27 DIAGNOSIS — W19XXXA Unspecified fall, initial encounter: Secondary | ICD-10-CM | POA: Insufficient documentation

## 2013-10-27 DIAGNOSIS — S82409A Unspecified fracture of shaft of unspecified fibula, initial encounter for closed fracture: Secondary | ICD-10-CM

## 2013-10-27 DIAGNOSIS — S82402A Unspecified fracture of shaft of left fibula, initial encounter for closed fracture: Secondary | ICD-10-CM

## 2013-10-27 DIAGNOSIS — S8263XA Displaced fracture of lateral malleolus of unspecified fibula, initial encounter for closed fracture: Secondary | ICD-10-CM | POA: Insufficient documentation

## 2013-10-27 IMAGING — CR DG ANKLE COMPLETE 3+V*L*
3 series · 3 of 3 positions shown · non-contrast
Comparison: None.

CLINICAL DATA: Fall

EXAM:
LEFT ANKLE COMPLETE - 3+ VIEW

[t ankle joint ap left]
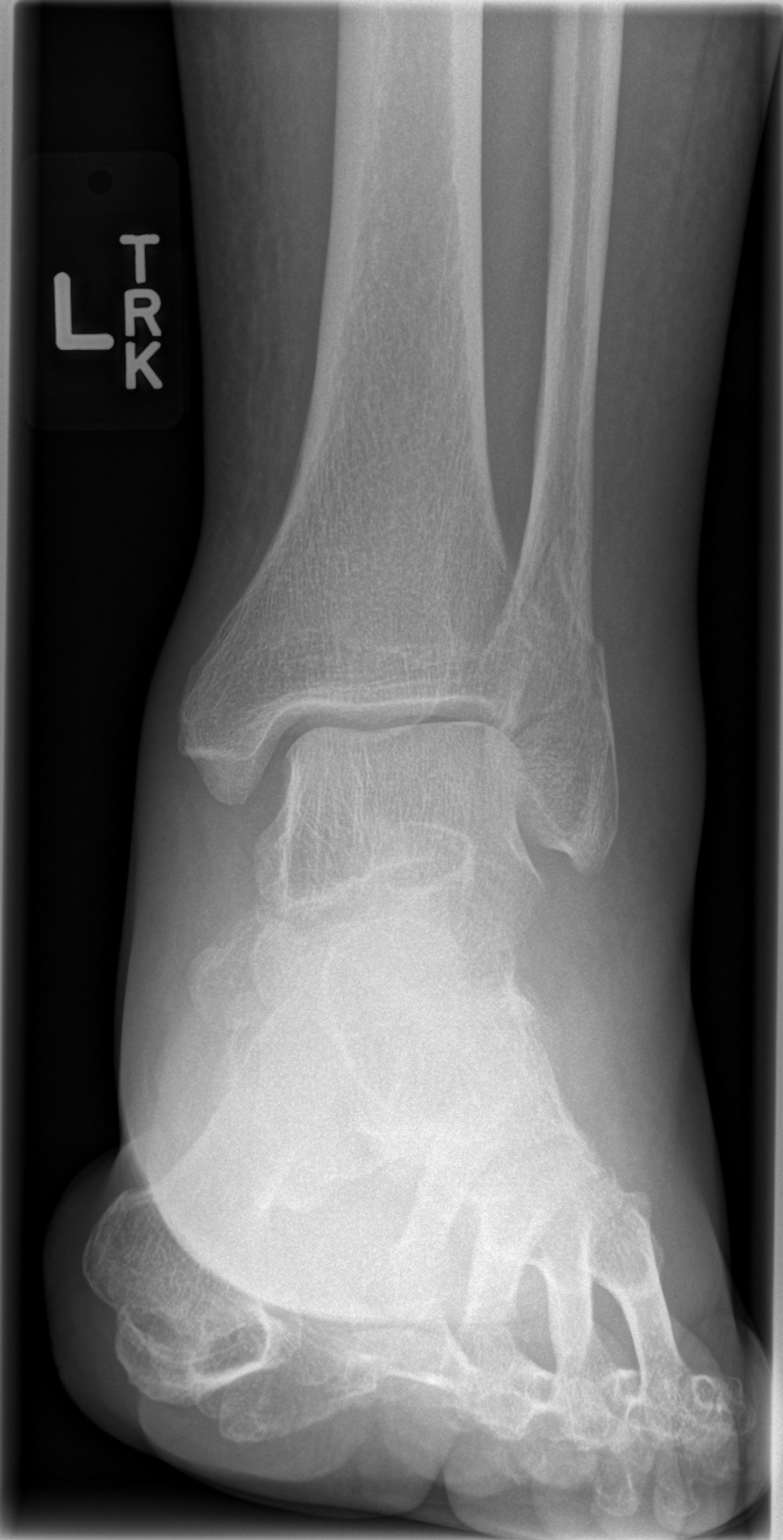

[t ankle joint oblique left]
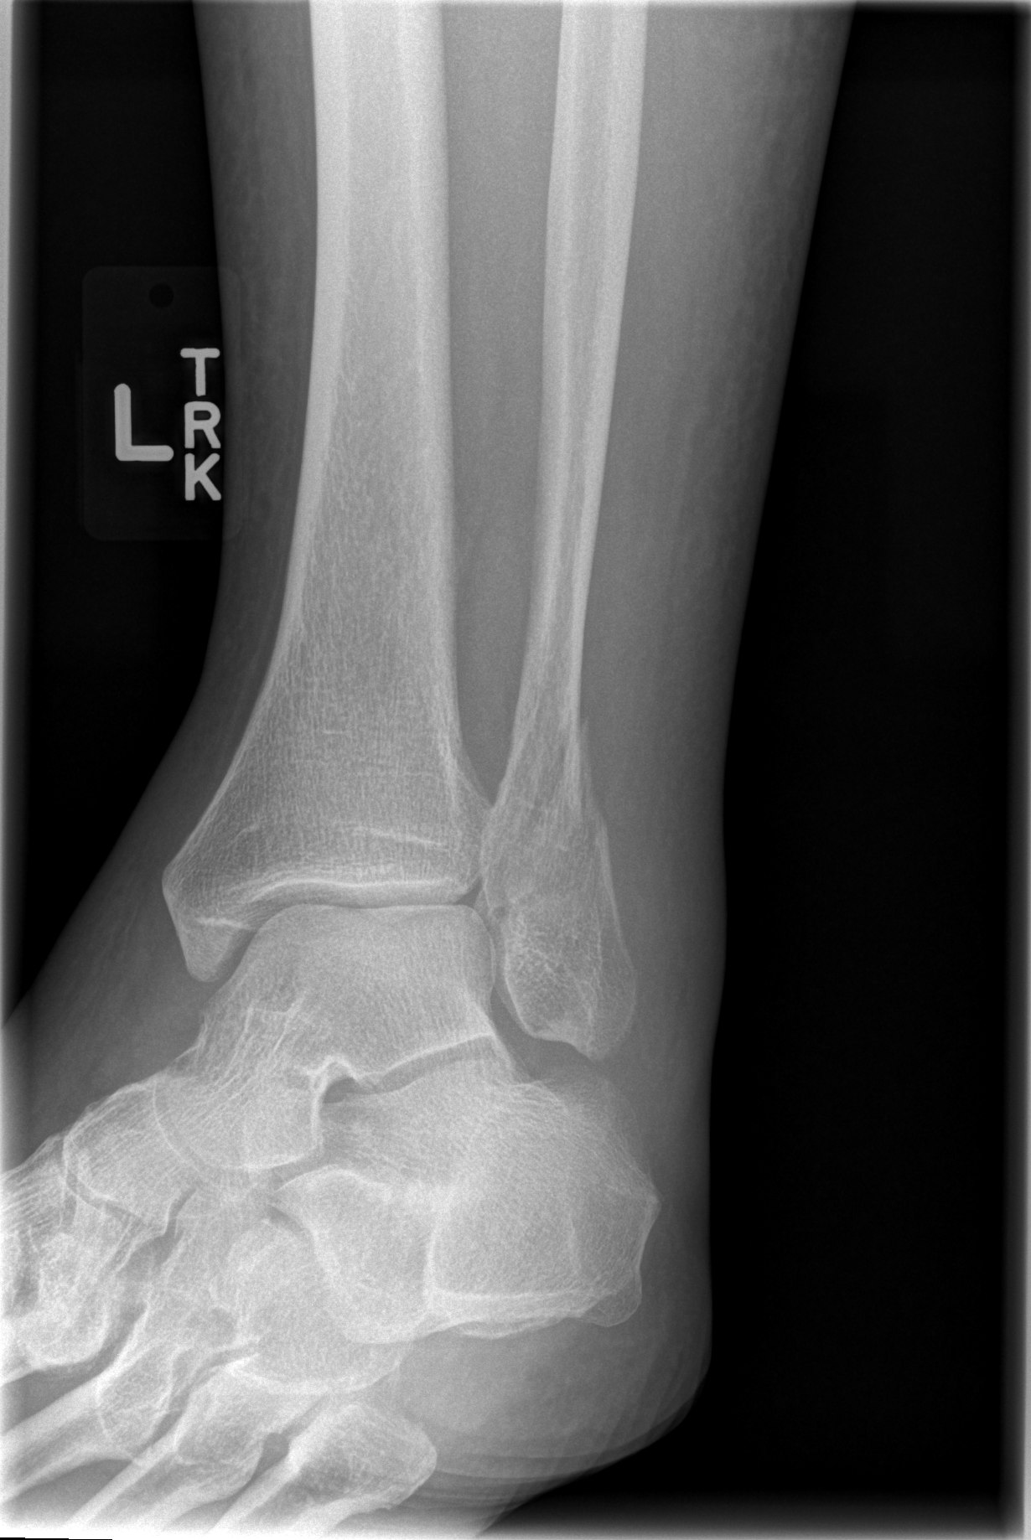

[t ankle joint lat left]
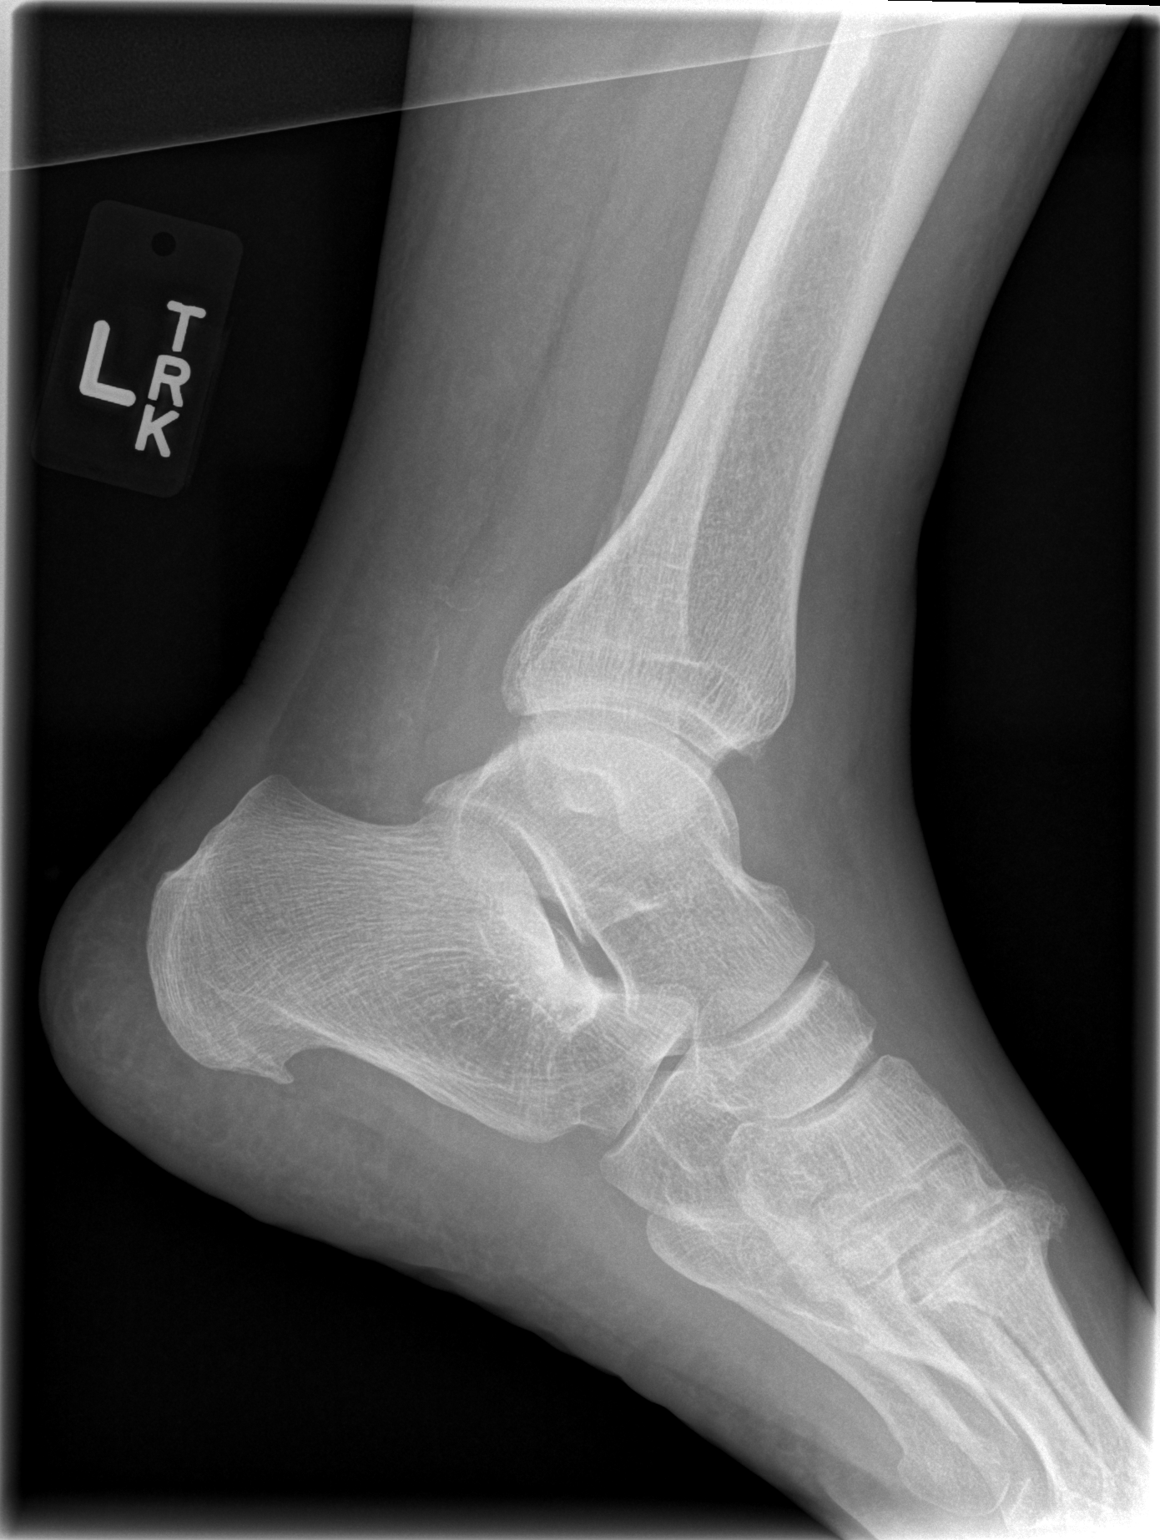

[3 of 3 positions shown; findings below may reference images not displayed]

FINDINGS: Minimally displaced distal fibular fracture into the ankle joint is
present. There is widening of the medial ankle mortise. Fracture
begins at the tibial plafond and extends superiorly. Medial
malleolus and posterior malleolus are intact. Talus and calcaneus
are intact. Degenerative changes in the midfoot.
IMPRESSION: Weber B lateral malleolus fracture.

## 2013-10-27 MED ORDER — TRAMADOL HCL 50 MG PO TABS: ORAL_TABLET | ORAL | Status: DC

## 2013-10-27 NOTE — Patient Instructions (Addendum)
Thank you for coming in today  Please wear your boot at all times except during sleep Take tramadol as needed for pain Get xrays at the hospital  DR [place] [phone] MON FEB 9TH 2015 [id number]   Followup in 2 weeks for recheck

## 2013-10-27 NOTE — Progress Notes (Signed)
CC: Left ankle pain HPI: Tony Roberts is a very pleasant 73 year old male who presents for evaluation of left ankle injury that occurred 2 days ago. He fell down some stairs of his foot got caught up underneath him. He thinks he had an inversion injury to his foot. It has been getting worse the last 2 days so he thought he should be seen. He has a focal area of pain over his distal fibula. He has swelling and bruising. He is currently taking Tylenol. He feels it is getting worse although he is still able to walk on it.  ROS: As above in the HPI. All other systems are stable or negative.  OBJECTIVE: APPEARANCE:  Patient in no acute distress.The patient appeared well nourished and normally developed. HEENT: No scleral icterus. Conjunctiva non-injected Resp: Non labored Skin: No rash MSK:  Ankle: Extensive ecchymosis or swelling. Range of motion is decreased in all directions do to swelling. Strength is 5/5 in all directions with pain on resisted inversion and eversion. Focally tender over lateral malleolus Mild laxity on anterior drawer Pain with talar tilt Able to walk 4 steps.   MSK [place]: Limited ultrasound of the left ankle was performed today in transverse and longitudinal views of the lateral malleolus. There was a discontinuity of the bony cortex of the lateral malleolus visualized within transverse and longitudinal views consistent with fracture   ASSESSMENT: #1. Left distal fibula fracture   PLAN: We will obtain x-rays to verify that there are no other fractures noted. He will be placed in a Cam Walker boot. He was given a prescription of tramadol to take as needed for pain. Followup 2 weeks.

## 2013-10-27 NOTE — Progress Notes (Signed)
Patient ID: Tony Roberts, male   DOB: 1940/09/22, 73 y.o.   MRN: [id number] Review of the x-rays we got today after his clinic visit show increased widening of the medial malleolus the. Potentially a unstable fracture, Weber B. I called him to discuss. I think I would like in the fall about worth at least initially. We discussed whether or not he should be weight bearing, since he started been weightbearing at home for the last 2 days I don't think there is much to be gained by putting him on crutches and he certainly does not want to use them.. He does not plan to be on his feet much this weekend and he was continuing to fracture walker bruit. I'll try to get him into orthopedics on Monday.

## 2013-11-10 ENCOUNTER — Ambulatory Visit: Payer: 59 | Admitting: Family Medicine

## 2015-09-10 ENCOUNTER — Other Ambulatory Visit: Payer: Self-pay | Admitting: Allergy and Immunology

## 2015-11-18 MED FILL — ADVAIR 250/50 DISKUS: 250-50 | 90 days supply | Qty: 180 | Fill #1

## 2015-11-29 MED FILL — MONTELUKAST SOD 10 MG TAB: 10 | 90 days supply | Qty: 90 | Fill #1

## 2015-12-11 ENCOUNTER — Other Ambulatory Visit: Payer: Self-pay | Admitting: Allergy and Immunology

## 2015-12-11 ENCOUNTER — Other Ambulatory Visit: Payer: Self-pay

## 2015-12-11 MED FILL — NexIUM 40 MG CPDR: 40 | 90 days supply | Qty: 90 | Fill #0

## 2015-12-18 MED FILL — AMLODIPINE BESYLATE 5 MG TA: 5 | 90 days supply | Qty: 90 | Fill #1

## 2016-02-05 DIAGNOSIS — I10 Essential (primary) hypertension: Secondary | ICD-10-CM | POA: Diagnosis not present

## 2016-02-05 DIAGNOSIS — R197 Diarrhea, unspecified: Secondary | ICD-10-CM | POA: Diagnosis not present

## 2016-02-05 DIAGNOSIS — J45909 Unspecified asthma, uncomplicated: Secondary | ICD-10-CM | POA: Diagnosis not present

## 2016-02-05 DIAGNOSIS — K219 Gastro-esophageal reflux disease without esophagitis: Secondary | ICD-10-CM | POA: Diagnosis not present

## 2016-02-05 DIAGNOSIS — E7800 Pure hypercholesterolemia, unspecified: Secondary | ICD-10-CM | POA: Diagnosis not present

## 2016-03-02 ENCOUNTER — Other Ambulatory Visit: Payer: Self-pay

## 2016-03-02 ENCOUNTER — Telehealth: Payer: Self-pay

## 2016-03-02 DIAGNOSIS — K2100 Gastro-esophageal reflux disease with esophagitis, without bleeding: Secondary | ICD-10-CM

## 2016-03-02 DIAGNOSIS — D508 Other iron deficiency anemias: Secondary | ICD-10-CM

## 2016-03-02 DIAGNOSIS — J309 Allergic rhinitis, unspecified: Secondary | ICD-10-CM

## 2016-03-02 MED ORDER — NEXIUM 40 MG PO CPDR: DELAYED_RELEASE_CAPSULE | ORAL | Status: DC

## 2016-03-02 MED ORDER — MONTELUKAST SODIUM 10 MG PO TABS: 10.0000 mg | ORAL_TABLET | Freq: Every day | ORAL | Status: DC

## 2016-03-02 MED FILL — NexIUM 40 MG CPDR: 40 | 30 days supply | Qty: 30 | Fill #0

## 2016-03-02 MED FILL — MONTELUKAST SOD 10 MG TAB: 10 | 30 days supply | Qty: 30 | Fill #0

## 2016-03-02 NOTE — Telephone Encounter (Signed)
Patient called pharmacy to get refills and was told they expired. Pt has an upcoming appt with Dr. Neldon Mc and would like to know if he can get his Nexium and Montelukast filled till his appt on the 28th.

## 2016-03-02 NOTE — Telephone Encounter (Signed)
Sent in rx with no refills

## 2016-03-18 ENCOUNTER — Encounter: Payer: Self-pay | Admitting: Allergy and Immunology

## 2016-03-18 ENCOUNTER — Ambulatory Visit (INDEPENDENT_AMBULATORY_CARE_PROVIDER_SITE_OTHER): Payer: 59 | Admitting: Allergy and Immunology

## 2016-03-18 VITALS — BP 134/72 | HR 70 | Resp 18 | Ht 65.55 in | Wt 193.4 lb

## 2016-03-18 DIAGNOSIS — K219 Gastro-esophageal reflux disease without esophagitis: Secondary | ICD-10-CM

## 2016-03-18 DIAGNOSIS — H1010 Acute atopic conjunctivitis, unspecified eye: Secondary | ICD-10-CM | POA: Diagnosis not present

## 2016-03-18 DIAGNOSIS — J309 Allergic rhinitis, unspecified: Secondary | ICD-10-CM | POA: Diagnosis not present

## 2016-03-18 DIAGNOSIS — J387 Other diseases of larynx: Secondary | ICD-10-CM

## 2016-03-18 DIAGNOSIS — J4540 Moderate persistent asthma, uncomplicated: Secondary | ICD-10-CM

## 2016-03-18 DIAGNOSIS — K2100 Gastro-esophageal reflux disease with esophagitis, without bleeding: Secondary | ICD-10-CM

## 2016-03-18 MED ORDER — ESOMEPRAZOLE MAGNESIUM 40 MG PO CPDR: DELAYED_RELEASE_CAPSULE | ORAL | Status: DC

## 2016-03-18 MED ORDER — ALBUTEROL SULFATE HFA 108 (90 BASE) MCG/ACT IN AERS
INHALATION_SPRAY | RESPIRATORY_TRACT | Status: DC
Start: 2016-03-18 — End: 2016-05-19

## 2016-03-18 MED ORDER — MONTELUKAST SODIUM 10 MG PO TABS: 10.0000 mg | ORAL_TABLET | Freq: Every day | ORAL | Status: DC

## 2016-03-18 MED ORDER — FLUTICASONE-SALMETEROL 250-50 MCG/DOSE IN AEPB
INHALATION_SPRAY | RESPIRATORY_TRACT | Status: DC
Start: 2016-03-18 — End: 2016-12-29

## 2016-03-18 MED FILL — AMLODIPINE BESYLATE 5 MG TA: 5 | 90 days supply | Qty: 90 | Fill #2

## 2016-03-18 NOTE — Progress Notes (Signed)
Follow-up Note  Referring Provider: Alroy Dust, L.Marlou Sa, MD Primary Provider: Donnie Coffin, MD Date of Office Visit: 03/18/2016  Subjective:   Tony Roberts (DOB: [birth year]) is a 75 y.o. male who returns to the [facility] on 03/18/2016 in re-evaluation of the following:  HPI: Tony Roberts returns to this clinic in reevaluation of his asthma and allergic rhinitis and LPR. It is been a little over 1 year since I've seen him in this clinic.  During the interval he is done quite well with his asthma not requiring a systemic steroid and rarely using a short acting bronchodilator and very little limitation on exercise. He continues to use his Advair mostly one time per day and on a rare occasion twice a day.  He's had very little problems with his upper airways and has not required an antibiotic to treat an episode of sinusitis while using his montelukast.  His stomach and his throat issue is doing very well while consistently using Nexium directed against his LPR.    Medication List           Fluticasone-Salmeterol 250-50 MCG/DOSE Aepb  Commonly known as:  ADVAIR  Inhale 1 puff into the lungs daily.     iron polysaccharides 150 MG capsule  Commonly known as:  NIFEREX  Take 150 mg by mouth daily.     montelukast 10 MG tablet  Commonly known as:  SINGULAIR  Take 1 tablet (10 mg total) by mouth at bedtime.     NEXIUM 40 MG capsule  Generic drug:  esomeprazole  TAKE 1 CAPSULE BY MOUTH ONCE DAILY FOR REFLUX     traMADol 50 MG tablet  Commonly known as:  ULTRAM  Take one or two every 8 hours as needed for ankle pain        Past Medical History  Diagnosis Date  . Other specified iron deficiency anemias   . GERD (gastroesophageal reflux disease)   . Asthma     Past Surgical History  Procedure Laterality Date  . Hernia repair    . Tonsillectomy    . Cataract extraction      No Known Allergies  Review of systems negative except as noted in HPI / PMHx or  noted below:  Review of Systems  Constitutional: Negative.   HENT: Negative.   Eyes: Negative.   Respiratory: Negative.   Cardiovascular: Negative.   Gastrointestinal: Negative.   Genitourinary: Negative.   Musculoskeletal: Negative.   Skin: Negative.   Neurological: Negative.   Endo/Heme/Allergies: Negative.   Psychiatric/Behavioral: Negative.      Objective:   Filed Vitals:   03/18/16 1120  BP: 134/72  Pulse: 70  Resp: 18   Height: 5' 5.55" (166.5 cm)  Weight: 193 lb 6.4 oz (87.726 kg)   Physical Exam  Constitutional: He is well-developed, well-nourished, and in no distress.  HENT:  Head: Normocephalic.  Right Ear: Tympanic membrane, external ear and ear canal normal.  Left Ear: Tympanic membrane, external ear and ear canal normal.  Nose: Nose normal. No mucosal edema or rhinorrhea.  Mouth/Throat: Uvula is midline, oropharynx is clear and moist and mucous membranes are normal. No oropharyngeal exudate.  Eyes: Conjunctivae are normal.  Neck: Trachea normal. No tracheal tenderness present. No tracheal deviation present. No thyromegaly present.  Cardiovascular: Normal rate, regular rhythm, S1 normal, S2 normal and normal heart sounds.   No murmur heard. Pulmonary/Chest: Breath sounds normal. No stridor. No respiratory distress. He has no wheezes. He has no rales.  Musculoskeletal: He exhibits no edema.  Lymphadenopathy:       Head (right side): No tonsillar adenopathy present.       Head (left side): No tonsillar adenopathy present.    He has no cervical adenopathy.  Neurological: He is alert. Gait normal.  Skin: No rash noted. He is not diaphoretic. No erythema. Nails show no clubbing.  Psychiatric: Mood and affect normal.    Diagnostics:    Spirometry was performed and demonstrated an FEV1 of 2.88 at 116 % of predicted.  The patient had an Asthma Control Test with the following results: ACT Total Score: 25.    Assessment and Plan:   1. Asthma, moderate  persistent, well-controlled   2. Allergic rhinoconjunctivitis   3. LPRD (laryngopharyngeal reflux disease)   4. Gastroesophageal reflux disease with esophagitis     1. Continue Advair 250 one inhalation 1-2 times per day depending on disease activity  2. Continue Singulair 10 mg daily  3. Continue Nexium 40 mg daily  4. Continue Proventil HFA if needed  5. Recommend a flu vaccine every year  6. Return to clinic in 1 year or earlier if problem   Tony Roberts has done very well on his current medical therapy and I see no need for changing this plan at this point in time. He will continue to use this treatment and I will see him back in this clinic in 1 year or earlier if there is a problem. I did encourage him to obtain a flu vaccine this fall but based upon prior history I don't think he is really going to get a flu vaccine. Fortunately, he did receive the Prevnar booster last year.  [name], MD [place]

## 2016-03-18 NOTE — Patient Instructions (Addendum)
  1. Continue Advair 250 one inhalation 1-2 times per day depending on disease activity  2. Continue Singulair 10 mg daily  3. Continue Nexium 40 mg daily  4. Continue Proventil HFA if needed  5. Recommend a flu vaccine every year  6. Return to clinic in 1 year or earlier if problem

## 2016-04-02 MED FILL — MONTELUKAST SOD 10 MG TAB: 10 | 90 days supply | Qty: 90 | Fill #0

## 2016-04-08 MED FILL — NexIUM 40 MG CPDR: 40 | 90 days supply | Qty: 90 | Fill #0

## 2016-05-13 MED FILL — ADVAIR 250/50 DISKUS: 250-50 | 90 days supply | Qty: 180 | Fill #0

## 2016-05-19 ENCOUNTER — Encounter: Payer: Self-pay | Admitting: Allergy and Immunology

## 2016-05-19 ENCOUNTER — Ambulatory Visit (INDEPENDENT_AMBULATORY_CARE_PROVIDER_SITE_OTHER): Payer: 59 | Admitting: Allergy and Immunology

## 2016-05-19 ENCOUNTER — Encounter (INDEPENDENT_AMBULATORY_CARE_PROVIDER_SITE_OTHER): Payer: Self-pay

## 2016-05-19 VITALS — BP 110/66 | HR 62 | Resp 20

## 2016-05-19 DIAGNOSIS — J309 Allergic rhinitis, unspecified: Secondary | ICD-10-CM

## 2016-05-19 DIAGNOSIS — J387 Other diseases of larynx: Secondary | ICD-10-CM

## 2016-05-19 DIAGNOSIS — K219 Gastro-esophageal reflux disease without esophagitis: Secondary | ICD-10-CM

## 2016-05-19 DIAGNOSIS — J019 Acute sinusitis, unspecified: Secondary | ICD-10-CM

## 2016-05-19 DIAGNOSIS — H1010 Acute atopic conjunctivitis, unspecified eye: Secondary | ICD-10-CM

## 2016-05-19 DIAGNOSIS — J4540 Moderate persistent asthma, uncomplicated: Secondary | ICD-10-CM

## 2016-05-19 MED ORDER — METHYLPREDNISOLONE ACETATE 80 MG/ML IJ SUSP
80.0000 mg | Freq: Once | INTRAMUSCULAR | Status: AC
  Administered 2016-05-19: 80 mg via INTRAMUSCULAR

## 2016-05-19 MED ORDER — ALBUTEROL SULFATE HFA 108 (90 BASE) MCG/ACT IN AERS: INHALATION_SPRAY | RESPIRATORY_TRACT | 1 refills | Status: AC

## 2016-05-19 NOTE — Progress Notes (Signed)
Follow-up Note  Referring Provider: Alroy Dust, L.Marlou Sa, MD Primary Provider: Donnie Coffin, MD Date of Office Visit: 05/19/2016  Subjective:   Tony Roberts (DOB: 1940-12-19) is a 75 y.o. male who returns to the [facility] on 05/19/2016 in re-evaluation of the following:  HPI: Tony Roberts presents to this clinic in reevaluation of his asthma and allergic rhinitis and LPR. I last saw him in his clinic on 03/18/2016 at which time he was doing very well regarding all these issues and he was instructed to return to this clinic in 1 year while utilizing medical therapy.  Unfortunately, last week he developed sore throat and nasal congestion and runny nose and although these issues are better he is developed rather significant cough without any chest tightness or shortness of breath or chest pain or fever or ugly sputum production.    Medication List      albuterol 108 (90 Base) MCG/ACT inhaler Commonly known as:  PROVENTIL HFA Inhale two puffs every four to six hours as needed for cough or wheeze.   amLODipine 5 MG tablet Commonly known as:  NORVASC Take 1 tablet by mouth daily.   esomeprazole 40 MG capsule Commonly known as:  NEXIUM TAKE 1 CAPSULE BY MOUTH ONCE DAILY FOR REFLUX   Fluticasone-Salmeterol 250-50 MCG/DOSE Aepb Commonly known as:  ADVAIR Use one inhalation one to two times a day to prevent cough or wheeze.  Rinse, gargle, and spit after use.   montelukast 10 MG tablet Commonly known as:  SINGULAIR Take 1 tablet (10 mg total) by mouth at bedtime.       Past Medical History:  Diagnosis Date  . Asthma   . GERD (gastroesophageal reflux disease)   . Other specified iron deficiency anemias     Past Surgical History:  Procedure Laterality Date  . CATARACT EXTRACTION    . HERNIA REPAIR    . TONSILLECTOMY      No Known Allergies  Review of systems negative except as noted in HPI / PMHx or noted below:  Review of Systems  Constitutional:  Negative.   HENT: Negative.   Eyes: Negative.   Respiratory: Negative.   Cardiovascular: Negative.   Gastrointestinal: Negative.   Genitourinary: Negative.   Musculoskeletal: Negative.   Skin: Negative.   Neurological: Negative.   Endo/Heme/Allergies: Negative.   Psychiatric/Behavioral: Negative.      Objective:   Vitals:   05/19/16 1713  BP: 110/66  Pulse: 62  Resp: 20          Physical Exam  Constitutional: He is well-developed, well-nourished, and in no distress.  HENT:  Head: Normocephalic.  Right Ear: Tympanic membrane, external ear and ear canal normal.  Left Ear: Tympanic membrane, external ear and ear canal normal.  Nose: Nose normal. No mucosal edema or rhinorrhea.  Mouth/Throat: Uvula is midline, oropharynx is clear and moist and mucous membranes are normal. No oropharyngeal exudate.  Eyes: Conjunctivae are normal.  Neck: Trachea normal. No tracheal tenderness present. No tracheal deviation present. No thyromegaly present.  Cardiovascular: Normal rate, regular rhythm, S1 normal, S2 normal and normal heart sounds.   No murmur heard. Pulmonary/Chest: Breath sounds normal. No stridor. No respiratory distress. He has no wheezes. He has no rales.  Musculoskeletal: He exhibits no edema.  Lymphadenopathy:       Head (right side): No tonsillar adenopathy present.       Head (left side): No tonsillar adenopathy present.    He has no cervical adenopathy.  Neurological: He is alert. Gait normal.  Skin: No rash noted. He is not diaphoretic. No erythema. Nails show no clubbing.  Psychiatric: Mood and affect normal.    Diagnostics:    Spirometry was performed and demonstrated an FEV1 of 2.66 at 117 % of predicted.  The patient had an Asthma Control Test with the following results:  .    Assessment and Plan:   1. Asthma, moderate persistent, well-controlled   2. Allergic rhinoconjunctivitis   3. LPRD (laryngopharyngeal reflux disease)   4. Acute sinusitis,  recurrence not specified, unspecified location     1. Continue Advair 250 one inhalation 1-2 times per day depending on disease activity  2. Continue Singulair 10 mg daily  3. Continue Nexium 40 mg daily  4. Continue Proventil HFA if needed  5. Depo-Medrol 80 IM delivered in clinic today  6. Nasal saline, Mucinex DM, ibuprofen, Claritin if needed  7. Recommend a flu vaccine every year  8. Return to clinic in 1 year or earlier if problem   I will assume that Tony Roberts had a viral respiratory tract infection with significant inflammation that developed as a result of this infection and treat him with anti-inflammatory agents in the form of a systemic steroid as noted above. He will keep in contact with me noting his response and certainly if he does have problems with this approach we will need to change our plan. Otherwise, he'll return to this clinic in 1 year.   [name], MD [facility]

## 2016-05-19 NOTE — Patient Instructions (Addendum)
  1. Continue Advair 250 one inhalation 1-2 times per day depending on disease activity  2. Continue Singulair 10 mg daily  3. Continue Nexium 40 mg daily  4. Continue Proventil HFA if needed  5. Depo-Medrol 80 IM delivered in clinic today  6. Nasal saline, Mucinex DM, ibuprofen, Claritin if needed  7. Recommend a flu vaccine every year  8. Return to clinic in 1 year or earlier if problem

## 2016-05-20 MED FILL — VENTOLIN HFA 90 MCG INHALER: 108 (90 BAS | 25 days supply | Qty: 18 | Fill #0

## 2016-06-10 ENCOUNTER — Telehealth: Payer: Self-pay | Admitting: Allergy and Immunology

## 2016-06-10 NOTE — Telephone Encounter (Signed)
Pt does not want to come off the Advair. He states it helps him but he is wondering instead of changing to something different can he just do one puff in the mornings instead of one puff in the evening? He says if that dose not work then he will think about switching to the other med.  Please advise

## 2016-06-10 NOTE — Telephone Encounter (Signed)
Please advise 

## 2016-06-10 NOTE — Telephone Encounter (Signed)
Patient called and wants to know about possible side effects with the medication, Advair, he is on. He is having serious leg cramps at night. He said he had the same problem when he was put on Symbicort so Dr. Neldon Mc switched him to Advair and now he is having the same problem. He said his potassium and magnesium levels are fine, so it is not that.

## 2016-06-10 NOTE — Telephone Encounter (Signed)
Please informed patient that it is possible that he is getting a side effect from Advair. We can try a different type of inhaler to see if this results in resolution of this issue. He can try Asmanex 200 HFA sample 2 puffs twice a day with spacer and he should know within several weeks whether or not Advair is the issue

## 2016-06-12 NOTE — Telephone Encounter (Signed)
Lm for pt to call us back  

## 2016-06-12 NOTE — Telephone Encounter (Signed)
Please inform patient that it is fine to attempt this manipulation and he should let us know if it does not work.

## 2016-06-12 NOTE — Telephone Encounter (Signed)
Informed pt of what dr Neldon Mc has stated.

## 2016-06-16 ENCOUNTER — Ambulatory Visit
Admission: RE | Admit: 2016-06-16 | Discharge: 2016-06-16 | Disposition: A | Discharge status: Home / Self Care | Payer: 59 | Source: Ambulatory Visit | Attending: Family Medicine | Admitting: Family Medicine

## 2016-06-16 ENCOUNTER — Other Ambulatory Visit: Payer: Self-pay | Admitting: Family Medicine

## 2016-06-16 DIAGNOSIS — M545 Low back pain: Secondary | ICD-10-CM

## 2016-06-16 DIAGNOSIS — M5136 Other intervertebral disc degeneration, lumbar region: Secondary | ICD-10-CM | POA: Diagnosis not present

## 2016-06-16 IMAGING — CR DG LUMBAR SPINE 2-3V
3 series · 3 of 3 positions shown · non-contrast
Comparison: None.

CLINICAL DATA: Low back and right lower extremity pain for 1 month
without known injury.

EXAM:
LUMBAR SPINE - 2-3 VIEW

[t l-spine a.p.]
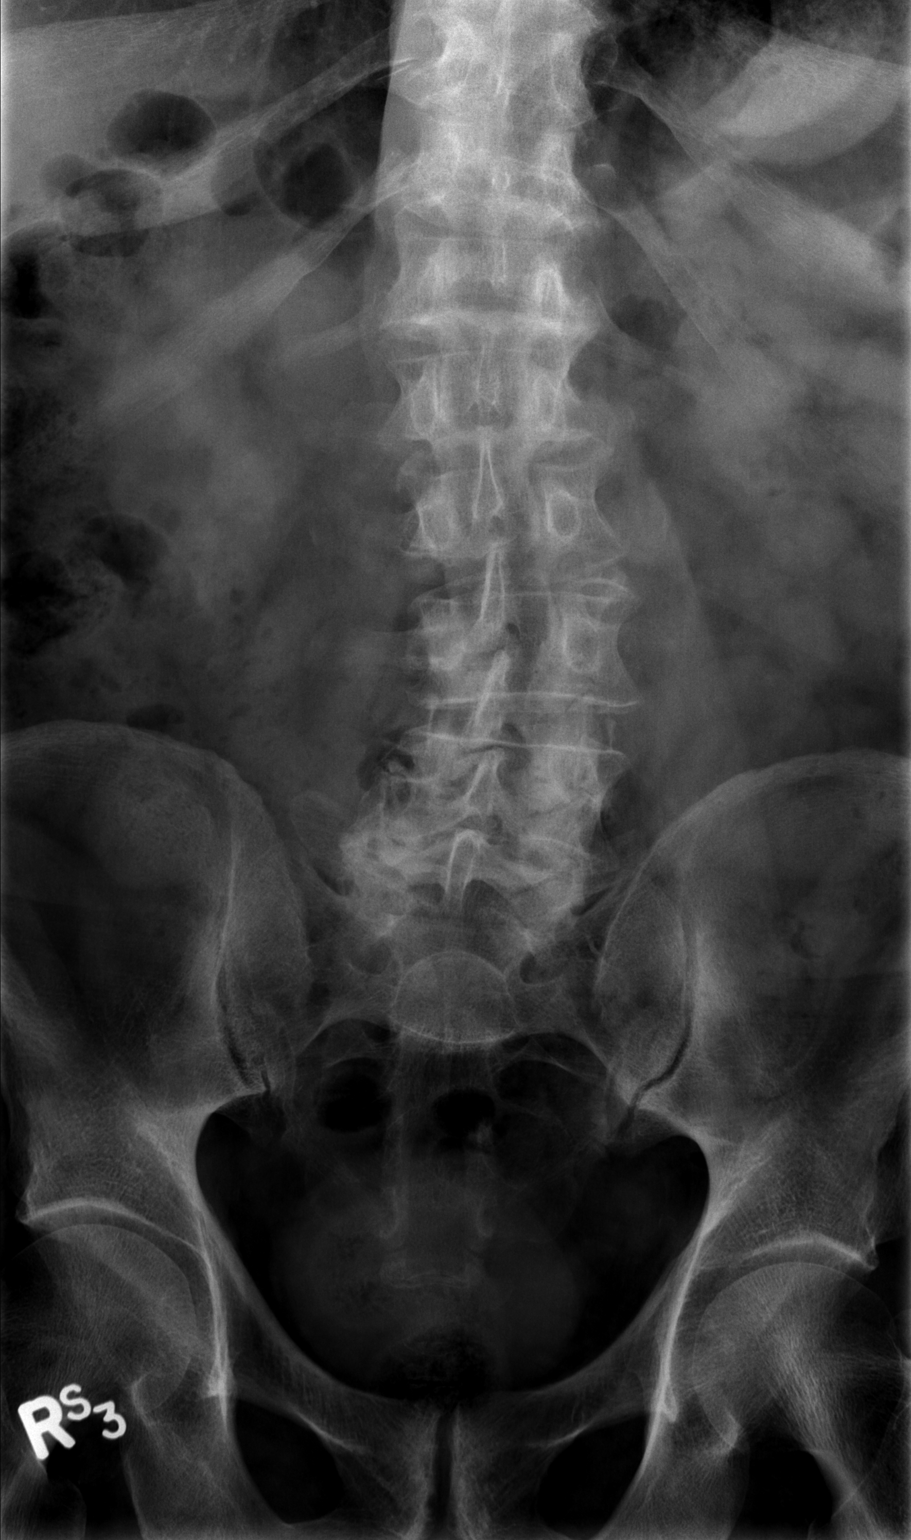

[t l-spine lat *]
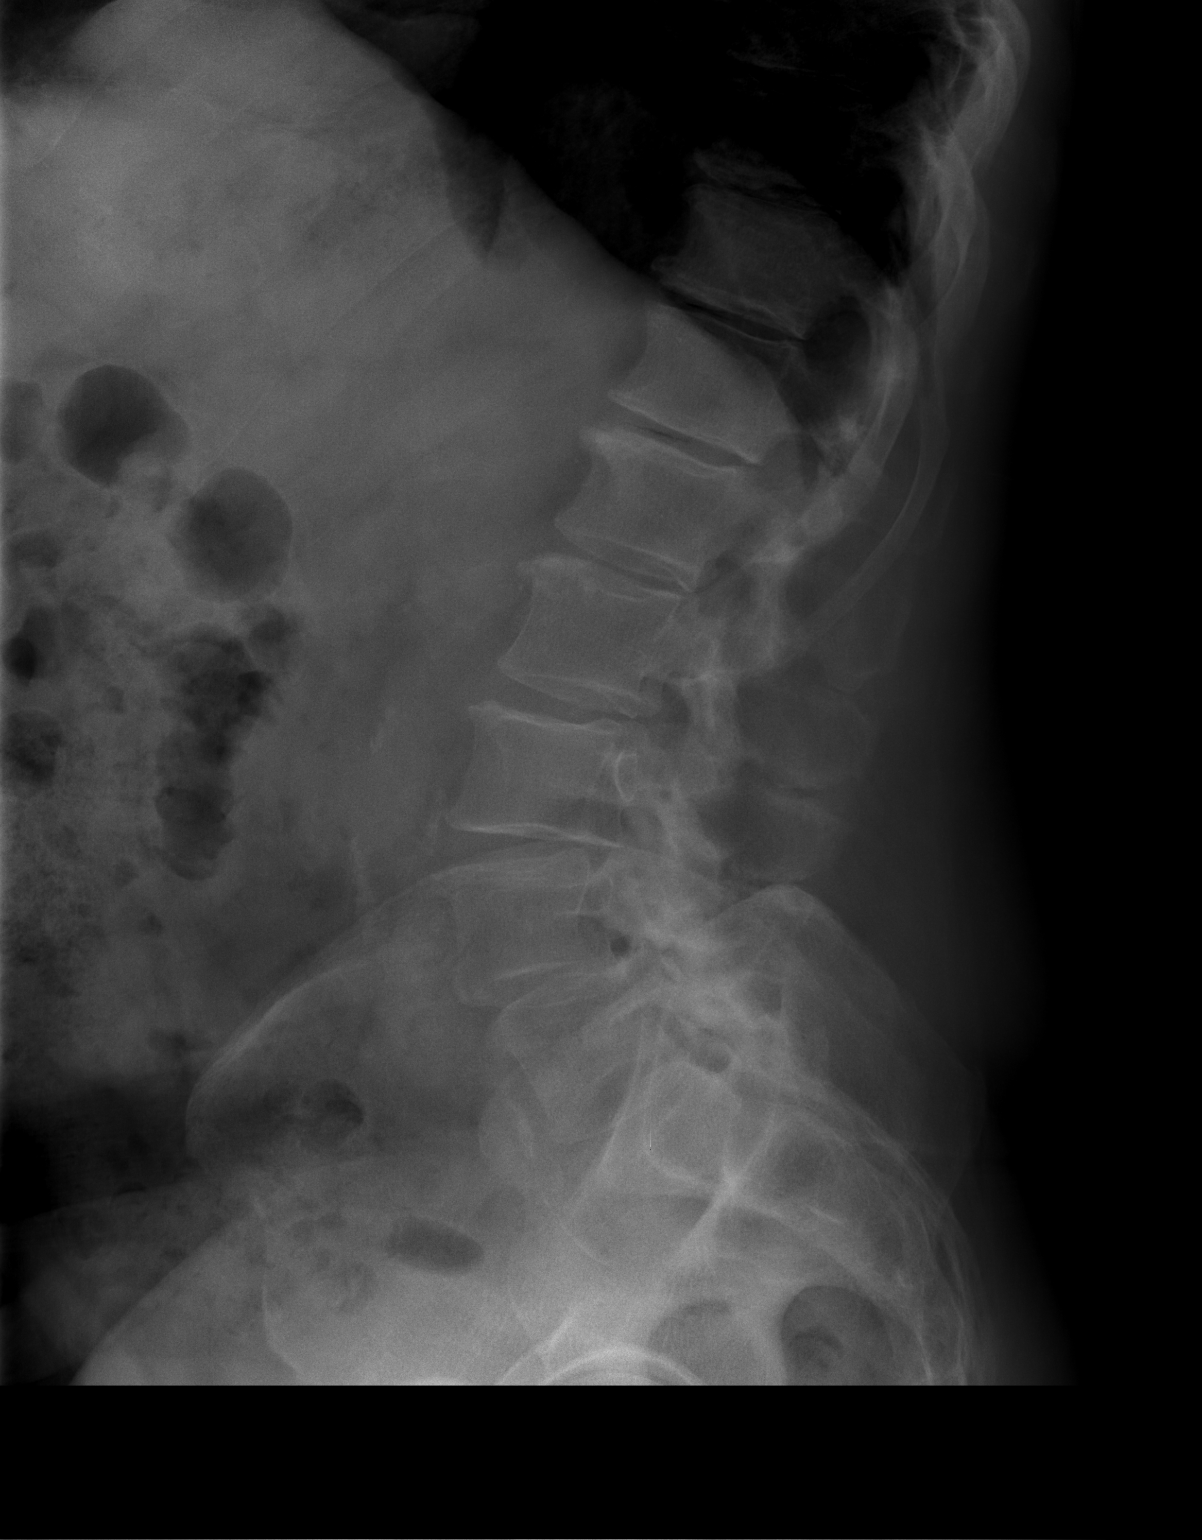

[t l-spine l5-s1 spot]
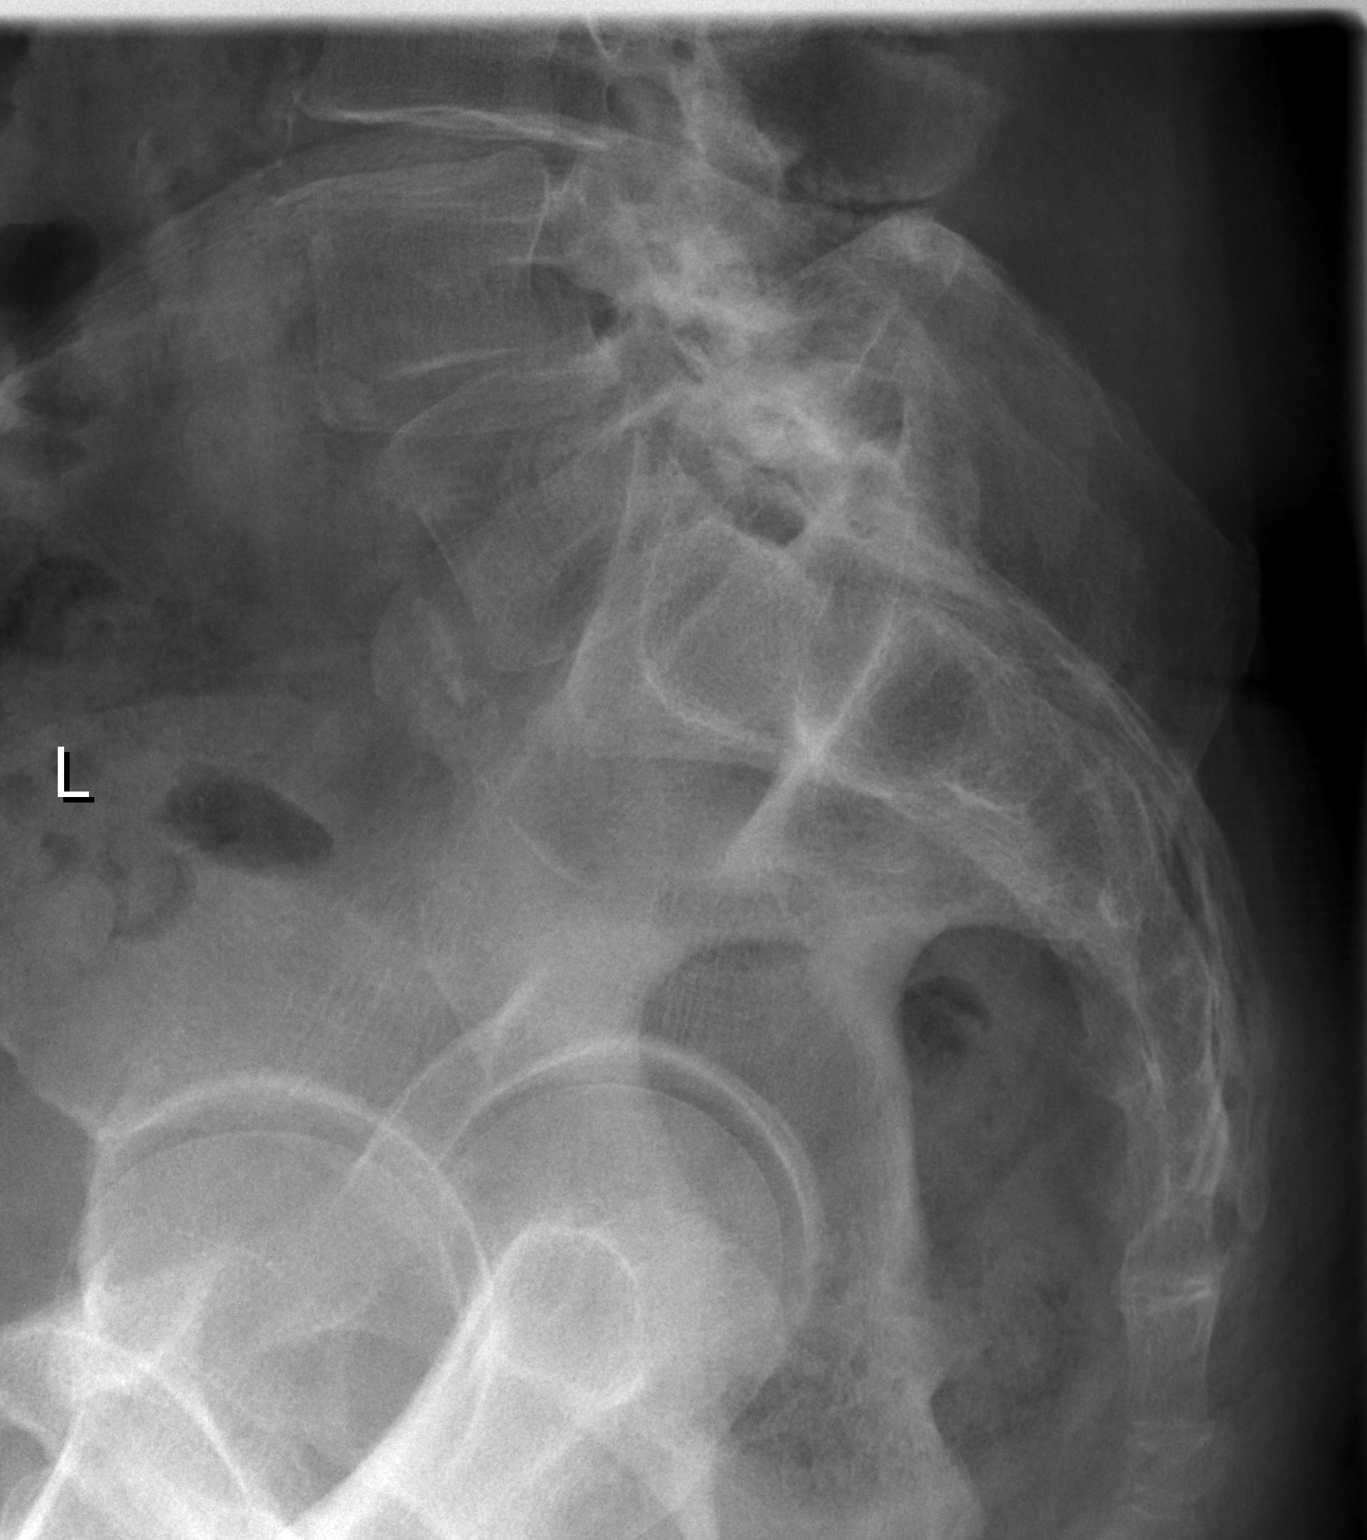

[3 of 3 positions shown; findings below may reference images not displayed]

FINDINGS: Mild levoscoliosis of lumbar spine is noted. No fracture or
spondylolisthesis is noted. Mild degenerative disc disease is noted
at L1-2, L2-3 and L3-4.
IMPRESSION: Multilevel degenerative disc disease. No acute abnormality seen in
the lumbar spine.

## 2016-06-16 MED FILL — predniSONE 20 MG TABS: 20 | 9 days supply | Qty: 18 | Fill #0

## 2016-06-19 MED FILL — AMLODIPINE BESYLATE 5 MG TA: 5 | 90 days supply | Qty: 90 | Fill #3

## 2016-06-24 DIAGNOSIS — M545 Low back pain: Secondary | ICD-10-CM | POA: Diagnosis not present

## 2016-06-24 MED FILL — predniSONE 20 MG TABS: 20 | 9 days supply | Qty: 20 | Fill #0

## 2016-07-03 MED FILL — DICLOFENAC SOD 75 MG TAB EC: 75 | 30 days supply | Qty: 60 | Fill #0

## 2016-07-06 MED FILL — MONTELUKAST SOD 10 MG TAB: 10 | 90 days supply | Qty: 90 | Fill #1

## 2016-07-06 MED FILL — NexIUM 40 MG CPDR: 40 | 90 days supply | Qty: 90 | Fill #1

## 2016-07-08 DIAGNOSIS — M5441 Lumbago with sciatica, right side: Secondary | ICD-10-CM | POA: Diagnosis not present

## 2016-07-16 DIAGNOSIS — M5416 Radiculopathy, lumbar region: Secondary | ICD-10-CM | POA: Diagnosis not present

## 2016-07-16 DIAGNOSIS — M4306 Spondylolysis, lumbar region: Secondary | ICD-10-CM | POA: Diagnosis not present

## 2016-07-24 DIAGNOSIS — M4306 Spondylolysis, lumbar region: Secondary | ICD-10-CM | POA: Diagnosis not present

## 2016-07-24 DIAGNOSIS — M5416 Radiculopathy, lumbar region: Secondary | ICD-10-CM | POA: Diagnosis not present

## 2016-07-30 DIAGNOSIS — M4306 Spondylolysis, lumbar region: Secondary | ICD-10-CM | POA: Diagnosis not present

## 2016-07-30 DIAGNOSIS — M5416 Radiculopathy, lumbar region: Secondary | ICD-10-CM | POA: Diagnosis not present

## 2016-07-31 DIAGNOSIS — M5440 Lumbago with sciatica, unspecified side: Secondary | ICD-10-CM | POA: Diagnosis not present

## 2016-07-31 DIAGNOSIS — M25511 Pain in right shoulder: Secondary | ICD-10-CM | POA: Diagnosis not present

## 2016-08-14 DIAGNOSIS — M545 Low back pain: Secondary | ICD-10-CM | POA: Diagnosis not present

## 2016-08-17 DIAGNOSIS — M48062 Spinal stenosis, lumbar region with neurogenic claudication: Secondary | ICD-10-CM | POA: Diagnosis not present

## 2016-08-17 MED FILL — traMADol HCL 50 MG TABS: 50 | 30 days supply | Qty: 60 | Fill #0

## 2016-08-25 DIAGNOSIS — M48062 Spinal stenosis, lumbar region with neurogenic claudication: Secondary | ICD-10-CM | POA: Diagnosis not present

## 2016-09-23 DIAGNOSIS — M48062 Spinal stenosis, lumbar region with neurogenic claudication: Secondary | ICD-10-CM | POA: Diagnosis not present

## 2016-09-23 MED FILL — AMLODIPINE BESYLATE 5 MG TA: 5 | 30 days supply | Qty: 30 | Fill #0

## 2016-09-29 DIAGNOSIS — M48062 Spinal stenosis, lumbar region with neurogenic claudication: Secondary | ICD-10-CM | POA: Diagnosis not present

## 2016-10-01 MED FILL — MONTELUKAST SOD 10 MG TAB: 10 | 90 days supply | Qty: 90 | Fill #2

## 2016-10-01 MED FILL — ESOMEPRAZOLE MAG DR 40 MG C: 40 | 90 days supply | Qty: 90 | Fill #2

## 2016-10-12 DIAGNOSIS — M48062 Spinal stenosis, lumbar region with neurogenic claudication: Secondary | ICD-10-CM | POA: Diagnosis not present

## 2016-10-14 ENCOUNTER — Other Ambulatory Visit: Payer: Self-pay | Admitting: Orthopedic Surgery

## 2016-10-20 ENCOUNTER — Other Ambulatory Visit: Payer: Self-pay

## 2016-10-20 NOTE — Patient Outreach (Signed)
Tony Roberts [facility]) Care Management  10/20/2016  Tony Roberts 01/26/1941 [id number]    Subjective: none     Objective: Per chart review, patient scheduled for Left sided Lumbar fusion on 10/29/16. Patient has a history of Asthma.   Assessment: Received [facility] Presurgical referral on 10/15/16. RNCM called to complete. No answer. HIPPA compliant message left.  Plan: RNCM will call patient for 2nd telephone outreach attempt within 1-2 weeks, if no return call.   Thea Silversmith, RN, MSN, [place] Coordinator Cell: [phone]

## 2016-10-21 ENCOUNTER — Other Ambulatory Visit: Payer: Self-pay

## 2016-10-21 NOTE — Patient Outreach (Signed)
[facility]) Care Management  10/21/2016  BROOX SERA 10/01/1940 [id number]  Subjective: Telephone call to patient. Discussed [facility] pre-op follow up. Patient voices understanding and agrees to pre-op call.    Objective: per chart review- Patient with spinal stenosis, lumbar region with neurogenic claudication-to be admitted on 10/29/16 for Left sided interbody spinal fusion.   Assessment: received [facility] pre-op call referral on 10/15/16. Pre op call completed. 76 year old with history of asthma. Mr. Kravets reports he he has completed FMLA forms and provider has completed. He reports he will follow up with [employer]. Mr. Cazenave states he is retired and disability coverage is not an issue. He is not sure if the Indemnity plan is part of their package, but states he will follow up with his wife. Mr. Mcfarren states he is expected to be in the hospital for one night.     Post procedure equipment/home health needs-Mr. Romeo states he has contacted [employer] for a back brace. He will follow up with them on Friday and be fitted for a back brace then. RNCM reinforced that the inpatient case manager will arrange any equipment needs prior to discharge along with any home health needs If there are any.   RNCM discussed [place] benefit is higher when using a [facility] facility/pharmacy. However, reinforced he has a choice of facility/agency.    Support: Mr. Chrzanowski states his wife is very supportive, adding she is more worried about him having the procedure than he is. His wife will take him to follow up appointments as well as pick up any new prescriptions from the pharmacy.  Discussed Advanced directives- not completed. RNCM offered Advanced directive information packet-Not receptive at this time.   No other medical issues identified and no additional community resource information needs at this time.  Patient is agreeable to follow up post procedure call.  Plan: telephonic RNCM will follow up  within 3 business days of notification.  Thea Silversmith, RN, MSN, [place] Coordinator Cell: [phone]

## 2016-10-22 ENCOUNTER — Encounter (HOSPITAL_COMMUNITY): Payer: Self-pay

## 2016-10-22 NOTE — Pre-Procedure Instructions (Signed)
Tony Roberts  10/22/2016      [place], [place] - [address] [facility] [place] [address] Phone: [phone] Fax: [phone]  [place] Tony Roberts, Tony Roberts Tony Roberts Tony Roberts [address] Phone: [phone] Fax: [phone]    Your procedure is scheduled on Thursday February 8.  Report to [facility] Admitting at 9:00 A.M.  Call this number if you have problems the morning of surgery:  [phone]   Remember:  Do not eat food or drink liquids after midnight.  Take these medicines the morning of surgery with A SIP OF WATER: amlodipine (norvasc), esomeprazole (nexium), tramadol (ultram) if needed, acetaminophen (tylenol) if needed, Advair, albuterol if needed (please bring to hospital with you)  7 days prior to surgery STOP taking any Aspirin, Aleve, Naproxen, Ibuprofen, Motrin, Advil, Goody's, BC's, all herbal medications, fish oil, and all vitamins    Do not wear jewelry, make-up or nail polish.  Do not wear lotions, powders, or perfumes, or deoderant.  Do not shave 48 hours prior to surgery.  Men may shave face and neck.  Do not bring valuables to the hospital.  [facility] is not responsible for any belongings or valuables.  Contacts, dentures or bridgework may not be worn into surgery.  Leave your suitcase in the car.  After surgery it may be brought to your room.  For patients admitted to the hospital, discharge time will be determined by your treatment team.  Patients discharged the day of surgery will not be allowed to drive home.   Special instructions:    [facility]- Preparing For Surgery  Before surgery, you can play an important role. Because skin is not sterile, your skin needs to be as free of germs as possible. You can reduce the number of germs on your skin by washing with CHG (chlorahexidine gluconate) Soap before surgery.  CHG is an  antiseptic cleaner which kills germs and bonds with the skin to continue killing germs even after washing.  Please do not use if you have an allergy to CHG or antibacterial soaps. If your skin becomes reddened/irritated stop using the CHG.  Do not shave (including legs and underarms) for at least 48 hours prior to first CHG shower. It is OK to shave your face.  Please follow these instructions carefully.   1. Shower the NIGHT BEFORE SURGERY and the MORNING OF SURGERY with CHG.   2. If you chose to wash your hair, wash your hair first as usual with your normal shampoo.  3. After you shampoo, rinse your hair and body thoroughly to remove the shampoo.  4. Use CHG as you would any other liquid soap. You can apply CHG directly to the skin and wash gently with a scrungie or a clean washcloth.   5. Apply the CHG Soap to your body ONLY FROM THE NECK DOWN.  Do not use on open wounds or open sores. Avoid contact with your eyes, ears, mouth and genitals (private parts). Wash genitals (private parts) with your normal soap.  6. Wash thoroughly, paying special attention to the area where your surgery will be performed.  7. Thoroughly rinse your body with warm water from the neck down.  8. DO NOT shower/wash with your normal soap after using and rinsing off the CHG Soap.  9. Pat yourself dry with a CLEAN TOWEL.   10. Wear CLEAN PAJAMAS  11. Place CLEAN SHEETS on your bed the night of your first shower and DO NOT SLEEP WITH PETS.    Day of Surgery: Do not apply any deodorants/lotions. Please wear clean clothes to the hospital/surgery center.      Please read over the following fact sheets that you were given. Coughing and Deep Breathing and MRSA Information

## 2016-10-23 ENCOUNTER — Encounter (HOSPITAL_COMMUNITY)
Admission: RE | Admit: 2016-10-23 | Discharge: 2016-10-23 | Disposition: A | Discharge status: Home / Self Care | Payer: 59 | Source: Ambulatory Visit | Attending: Orthopedic Surgery | Admitting: Orthopedic Surgery

## 2016-10-23 ENCOUNTER — Encounter (HOSPITAL_COMMUNITY): Payer: Self-pay

## 2016-10-23 DIAGNOSIS — Z01812 Encounter for preprocedural laboratory examination: Secondary | ICD-10-CM | POA: Diagnosis not present

## 2016-10-23 DIAGNOSIS — Z0183 Encounter for blood typing: Secondary | ICD-10-CM | POA: Diagnosis not present

## 2016-10-23 DIAGNOSIS — M79605 Pain in left leg: Secondary | ICD-10-CM | POA: Insufficient documentation

## 2016-10-23 DIAGNOSIS — R9431 Abnormal electrocardiogram [ECG] [EKG]: Secondary | ICD-10-CM | POA: Diagnosis not present

## 2016-10-23 DIAGNOSIS — Z01818 Encounter for other preprocedural examination: Secondary | ICD-10-CM | POA: Insufficient documentation

## 2016-10-23 DIAGNOSIS — M79604 Pain in right leg: Secondary | ICD-10-CM | POA: Diagnosis not present

## 2016-10-23 HISTORY — DX: Unspecified osteoarthritis, unspecified site: M19.90

## 2016-10-23 HISTORY — DX: Atrial premature depolarization: I49.1

## 2016-10-23 HISTORY — DX: Headache: R51

## 2016-10-23 HISTORY — DX: Headache, unspecified: R51.9

## 2016-10-23 LAB — COMPREHENSIVE METABOLIC PANEL
ALBUMIN: 4 g/dL (ref 3.5–5.0)
ALK PHOS: 82 U/L (ref 38–126)
ALT: 17 U/L (ref 17–63)
ANION GAP: 9 (ref 5–15)
AST: 22 U/L (ref 15–41)
BUN: 11 mg/dL (ref 6–20)
CALCIUM: 9 mg/dL (ref 8.9–10.3)
CO2: 25 mmol/L (ref 22–32)
Chloride: 105 mmol/L (ref 101–111)
Creatinine, Ser: 0.94 mg/dL (ref 0.61–1.24)
GFR calc Af Amer: >60 mL/min (ref >60)
GFR calc non Af Amer: >60 mL/min (ref >60)
GLUCOSE: 103 mg/dL — AB (ref 65–99)
POTASSIUM: 3.5 mmol/L (ref 3.5–5.1)
SODIUM: 139 mmol/L (ref 135–145)
Total Bilirubin: 0.5 mg/dL (ref 0.3–1.2)
Total Protein: 6.4 g/dL — ABNORMAL LOW (ref 6.5–8.1)

## 2016-10-23 LAB — URINALYSIS, ROUTINE W REFLEX MICROSCOPIC
Bilirubin Urine: NEGATIVE
Glucose, UA: NEGATIVE mg/dL
Hgb urine dipstick: NEGATIVE
Ketones, ur: NEGATIVE mg/dL
Leukocytes, UA: NEGATIVE
NITRITE: NEGATIVE
Protein, ur: NEGATIVE mg/dL
SPECIFIC GRAVITY, URINE: 1.014 (ref 1.005–1.030)
pH: 5 (ref 5.0–8.0)

## 2016-10-23 LAB — APTT: APTT: 34 s (ref 24–36)

## 2016-10-23 LAB — CBC WITH DIFFERENTIAL/PLATELET
BASOS ABS: 0.1 10*3/uL (ref 0.0–0.1)
BASOS PCT: 1 %
Eosinophils Absolute: 0.3 10*3/uL (ref 0.0–0.7)
Eosinophils Relative: 6 %
HCT: 41.1 % (ref 39.0–52.0)
HEMOGLOBIN: 13.3 g/dL (ref 13.0–17.0)
Lymphocytes Relative: 26 %
Lymphs Abs: 1.5 10*3/uL (ref 0.7–4.0)
MCH: 26.7 pg (ref 26.0–34.0)
MCHC: 32.4 g/dL (ref 30.0–36.0)
MCV: 82.5 fL (ref 78.0–100.0)
MONOS PCT: 8 %
Monocytes Absolute: 0.4 10*3/uL (ref 0.1–1.0)
NEUTROS PCT: 59 %
Neutro Abs: 3.5 10*3/uL (ref 1.7–7.7)
Platelets: 160 10*3/uL (ref 150–400)
RBC: 4.98 MIL/uL (ref 4.22–5.81)
RDW: 14.9 % (ref 11.5–15.5)
WBC: 5.9 10*3/uL (ref 4.0–10.5)

## 2016-10-23 LAB — ABO/RH: ABO/RH(D): O POS

## 2016-10-23 LAB — TYPE AND SCREEN
ABO/RH(D): O POS
ANTIBODY SCREEN: NEGATIVE

## 2016-10-23 LAB — SURGICAL PCR SCREEN
MRSA, PCR: NEGATIVE
STAPHYLOCOCCUS AUREUS: NEGATIVE

## 2016-10-23 LAB — PROTIME-INR
INR: 1.11
Prothrombin Time: 14.3 seconds (ref 11.4–15.2)

## 2016-10-23 IMAGING — CR DG CHEST 2V
2 series · 2 of 2 positions shown · non-contrast
Comparison: [DATE]

CLINICAL DATA: Preop exam.

EXAM:
CHEST  2 VIEW

[w chest lat]
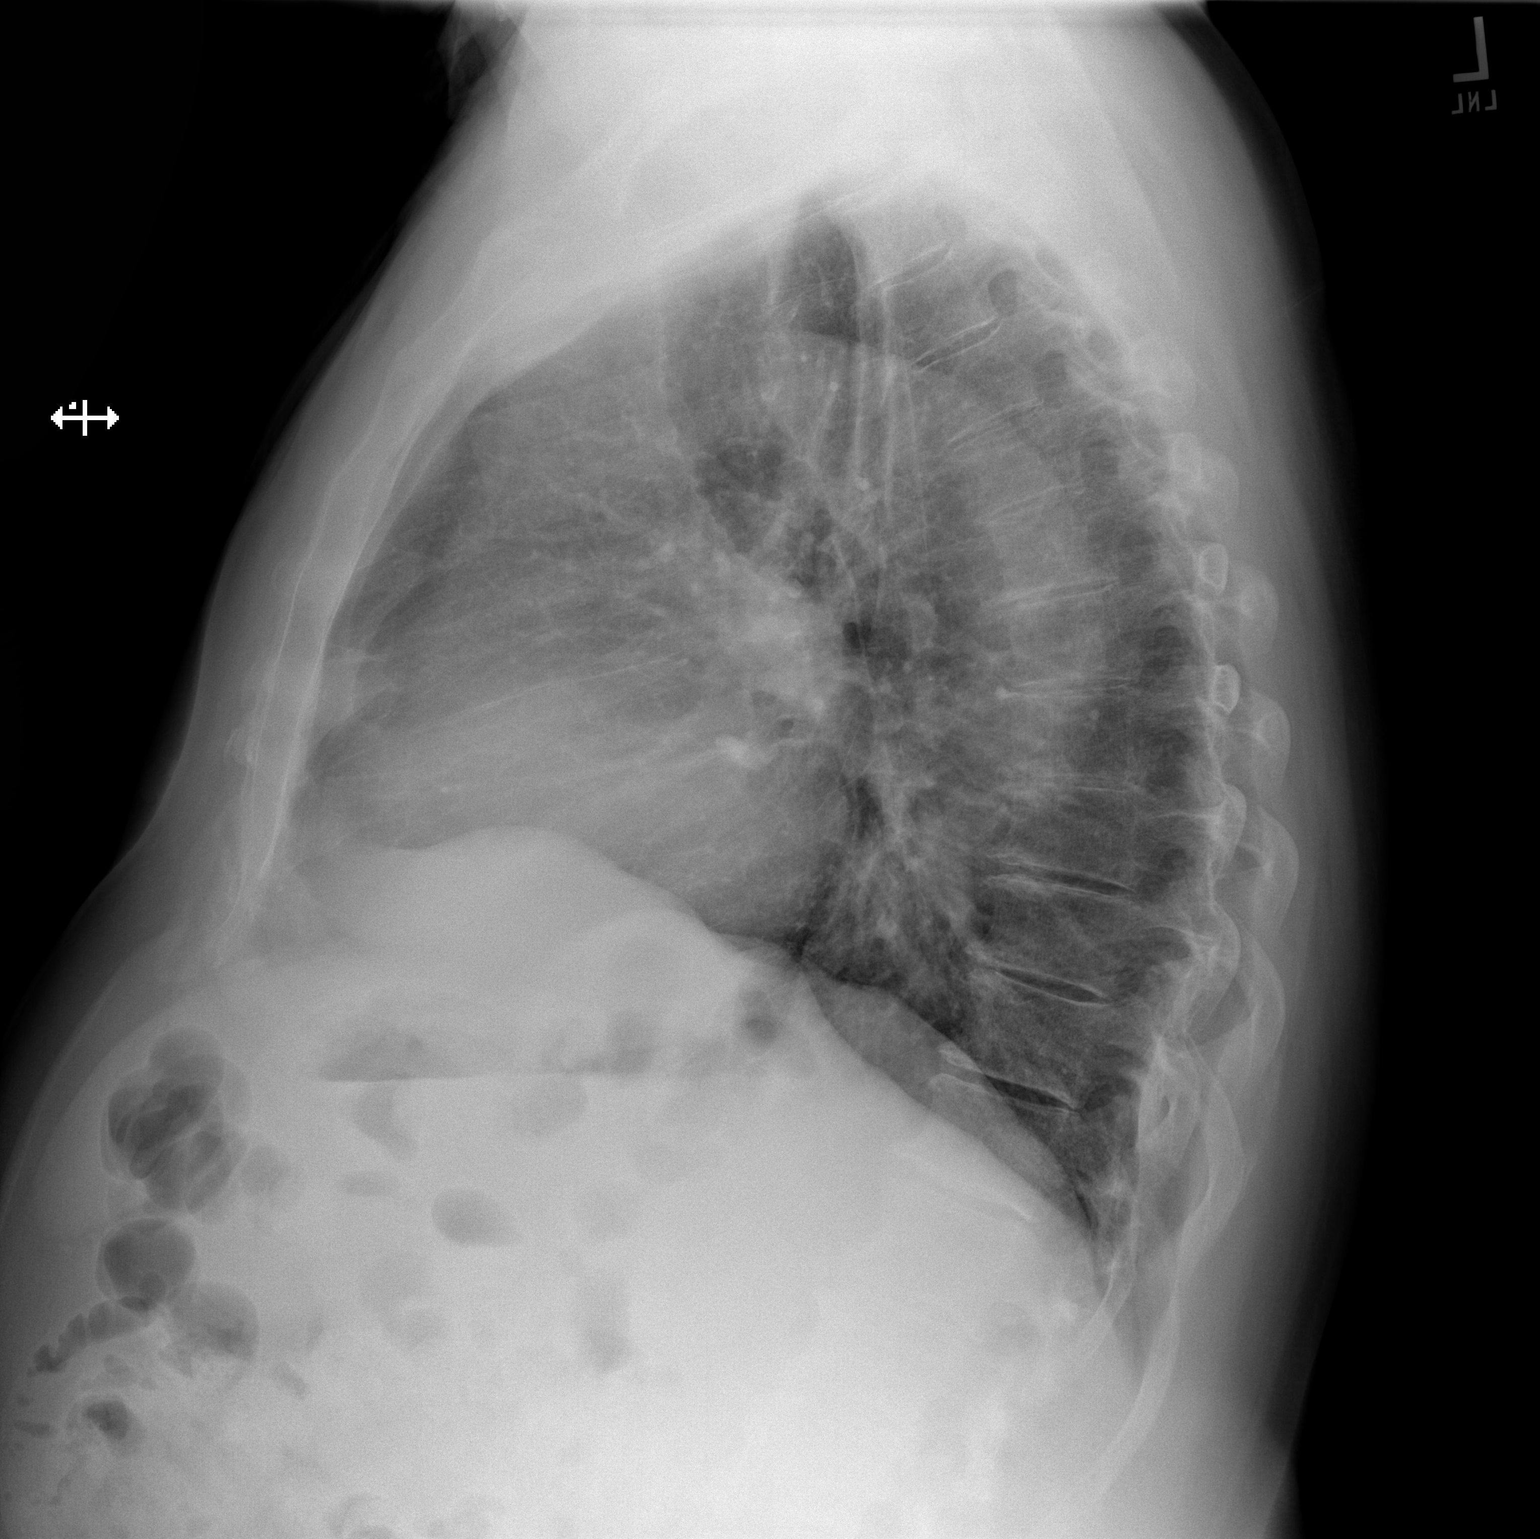

[w chest pa]
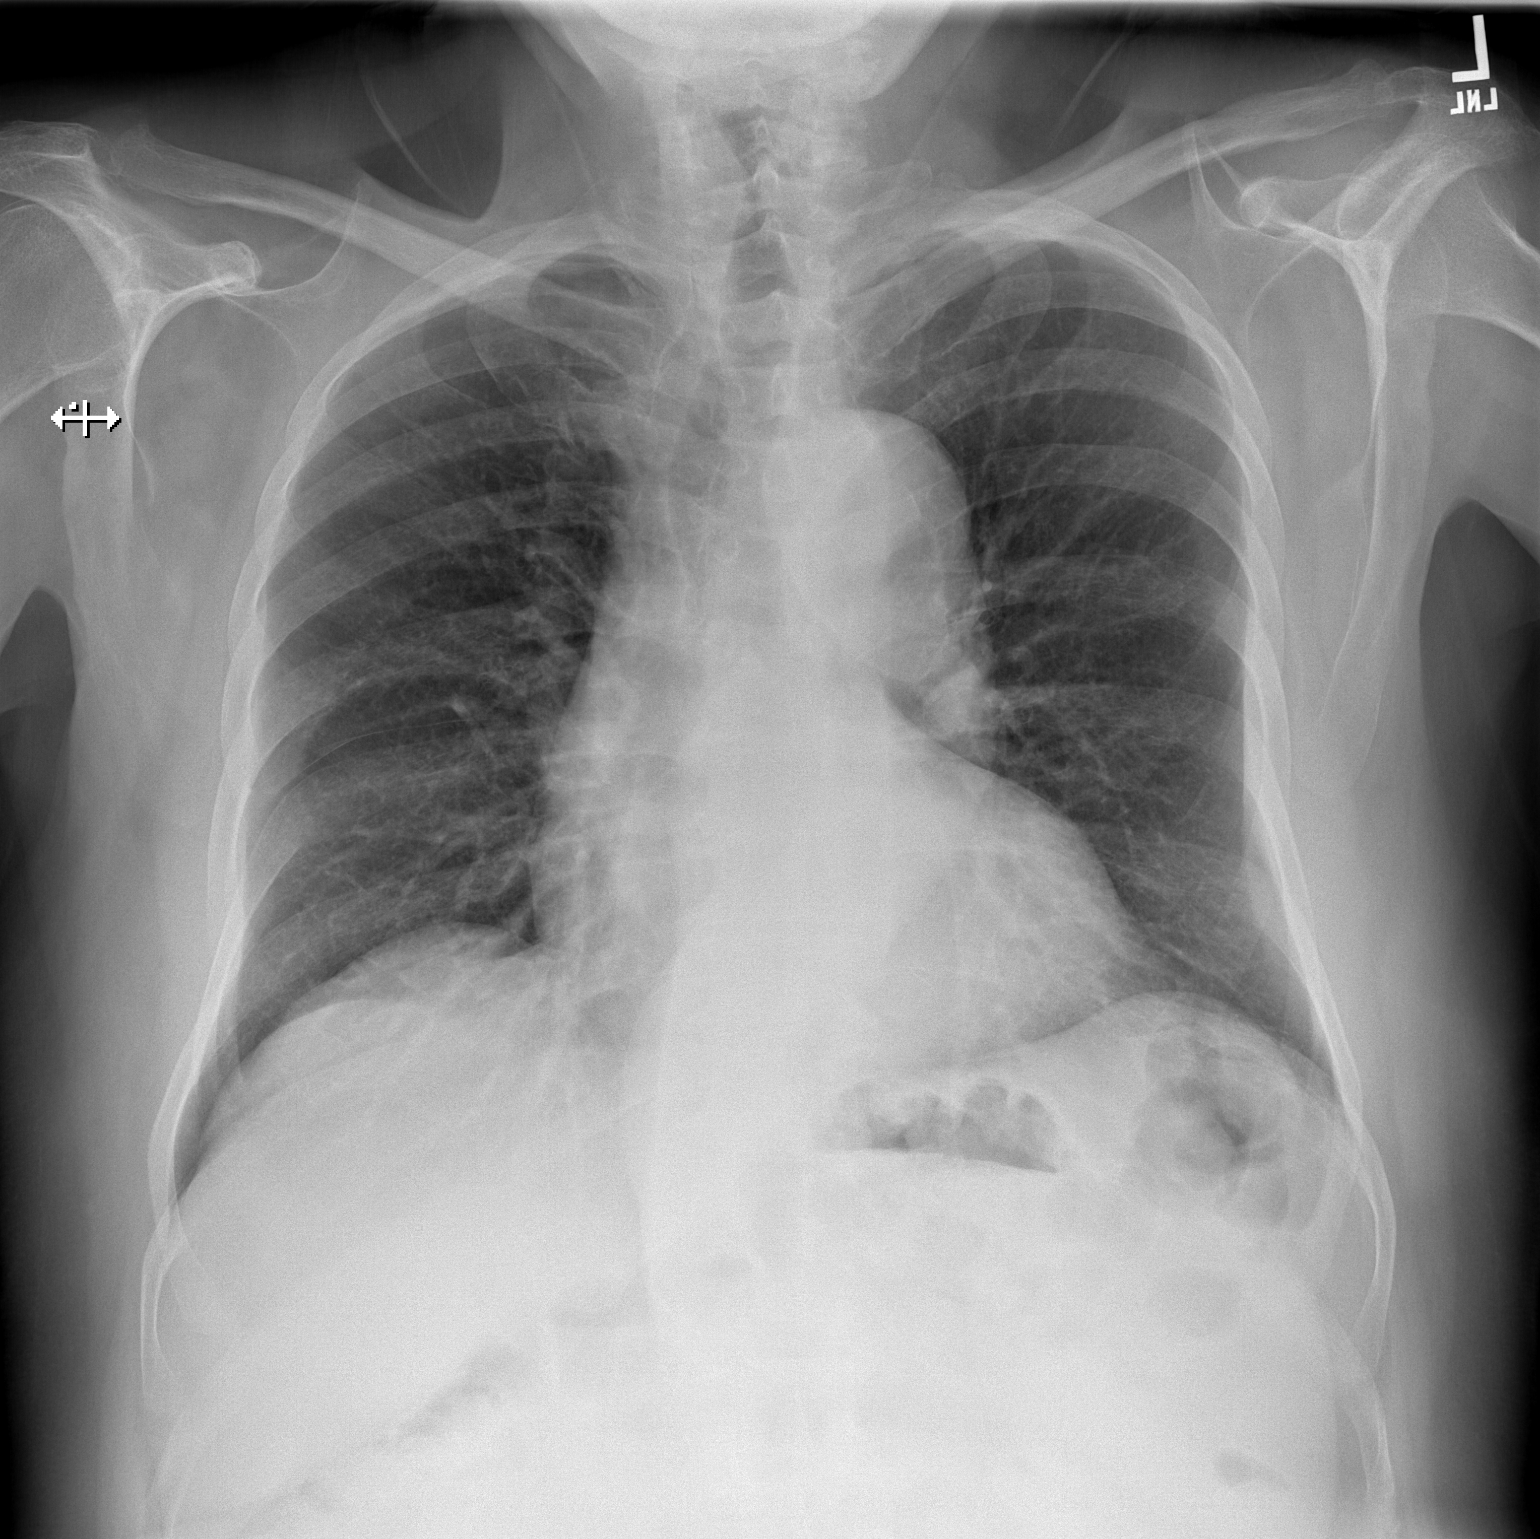

[2 of 2 positions shown; findings below may reference images not displayed]

FINDINGS: Heart is normal size. Tortuosity of the thoracic aorta. Lungs are
clear. No effusions or acute bony abnormality.
IMPRESSION: No active cardiopulmonary disease.

## 2016-10-23 NOTE — Progress Notes (Signed)
PCP: L.Donnie Coffin   Pt denies cardiologist or cardiac hx  EKG: 10/23/16 CXR: 10/23/16

## 2016-10-29 ENCOUNTER — Encounter (HOSPITAL_COMMUNITY)
Admission: RE | Disposition: A | Discharge status: Home / Self Care | Payer: Self-pay | Source: Ambulatory Visit | Attending: Orthopedic Surgery

## 2016-10-29 ENCOUNTER — Encounter (HOSPITAL_COMMUNITY): Payer: Self-pay | Admitting: Anesthesiology

## 2016-10-29 ENCOUNTER — Inpatient Hospital Stay (HOSPITAL_COMMUNITY): Payer: 59 | Admitting: Certified Registered Nurse Anesthetist

## 2016-10-29 ENCOUNTER — Inpatient Hospital Stay (HOSPITAL_COMMUNITY): Payer: 59

## 2016-10-29 ENCOUNTER — Inpatient Hospital Stay (HOSPITAL_COMMUNITY): Payer: 59 | Admitting: Emergency Medicine

## 2016-10-29 ENCOUNTER — Inpatient Hospital Stay (HOSPITAL_COMMUNITY)
Admission: RE | Admit: 2016-10-29 | Discharge: 2016-10-30 | DRG: 460 | Disposition: A | Discharge status: Home / Self Care | Payer: 59 | Source: Ambulatory Visit | Attending: Orthopedic Surgery | Admitting: Orthopedic Surgery

## 2016-10-29 DIAGNOSIS — M79604 Pain in right leg: Secondary | ICD-10-CM | POA: Diagnosis not present

## 2016-10-29 DIAGNOSIS — M5416 Radiculopathy, lumbar region: Secondary | ICD-10-CM | POA: Diagnosis present

## 2016-10-29 DIAGNOSIS — M4316 Spondylolisthesis, lumbar region: Secondary | ICD-10-CM | POA: Diagnosis present

## 2016-10-29 DIAGNOSIS — Z419 Encounter for procedure for purposes other than remedying health state, unspecified: Secondary | ICD-10-CM

## 2016-10-29 DIAGNOSIS — M48061 Spinal stenosis, lumbar region without neurogenic claudication: Principal | ICD-10-CM | POA: Diagnosis present

## 2016-10-29 DIAGNOSIS — M5410 Radiculopathy, site unspecified: Secondary | ICD-10-CM

## 2016-10-29 DIAGNOSIS — D509 Iron deficiency anemia, unspecified: Secondary | ICD-10-CM | POA: Diagnosis not present

## 2016-10-29 DIAGNOSIS — Z7951 Long term (current) use of inhaled steroids: Secondary | ICD-10-CM

## 2016-10-29 DIAGNOSIS — K219 Gastro-esophageal reflux disease without esophagitis: Secondary | ICD-10-CM | POA: Diagnosis present

## 2016-10-29 DIAGNOSIS — M1990 Unspecified osteoarthritis, unspecified site: Secondary | ICD-10-CM | POA: Diagnosis not present

## 2016-10-29 DIAGNOSIS — M79605 Pain in left leg: Secondary | ICD-10-CM | POA: Diagnosis not present

## 2016-10-29 DIAGNOSIS — J45909 Unspecified asthma, uncomplicated: Secondary | ICD-10-CM | POA: Diagnosis present

## 2016-10-29 DIAGNOSIS — Z825 Family history of asthma and other chronic lower respiratory diseases: Secondary | ICD-10-CM | POA: Diagnosis not present

## 2016-10-29 DIAGNOSIS — Z981 Arthrodesis status: Secondary | ICD-10-CM | POA: Diagnosis not present

## 2016-10-29 DIAGNOSIS — M48062 Spinal stenosis, lumbar region with neurogenic claudication: Secondary | ICD-10-CM | POA: Diagnosis not present

## 2016-10-29 HISTORY — PX: SPINE SURGERY: SHX786

## 2016-10-29 IMAGING — CR DG LUMBAR SPINE 1V
1 series · 1 of 1 positions shown · non-contrast
Comparison: Lumbar spine films of [DATE]

CLINICAL DATA: L4-5 lumbar fusion

EXAM:
LUMBAR SPINE - 1 VIEW

[lateral]
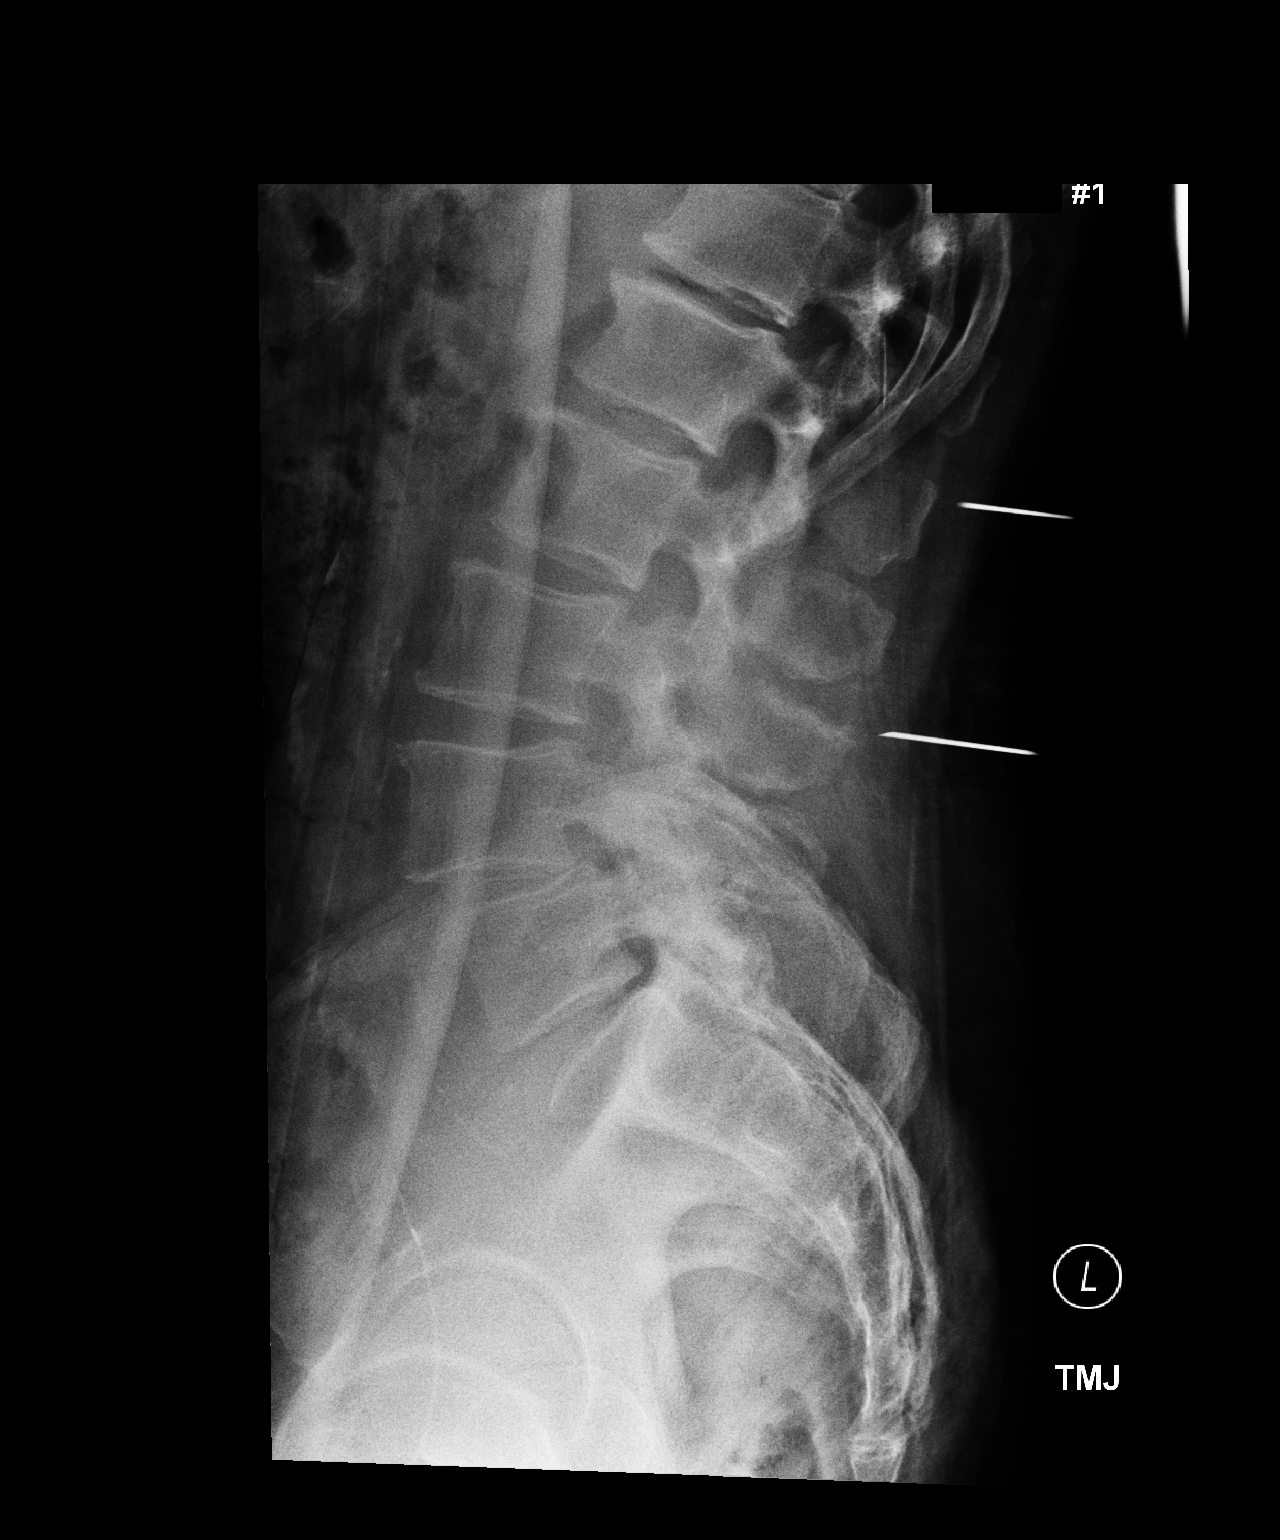

[1 of 1 positions shown; findings below may reference images not displayed]

FINDINGS: Cross-table lateral view labeled 1 shows a needle positioned
posteriorly directed toward the spinous process of L1 with a second
more caudal needle directed toward the spinous process of L3 for
localization.
IMPRESSION: Needles posteriorly positioned directed toward the spinous processes
of L1 and L3 respectively.

## 2016-10-29 IMAGING — RF DG C-ARM 61-120 MIN
1 series · 2 of 2 positions shown · non-contrast
Comparison: None.

CLINICAL DATA: L4-5 surgery

EXAM:
DG C-ARM 61-120 MIN
FLUOROSCOPY TIME:  47 seconds
Images: 2

[Series 1: run · 2 of 2 slices shown]
[im 1/2]
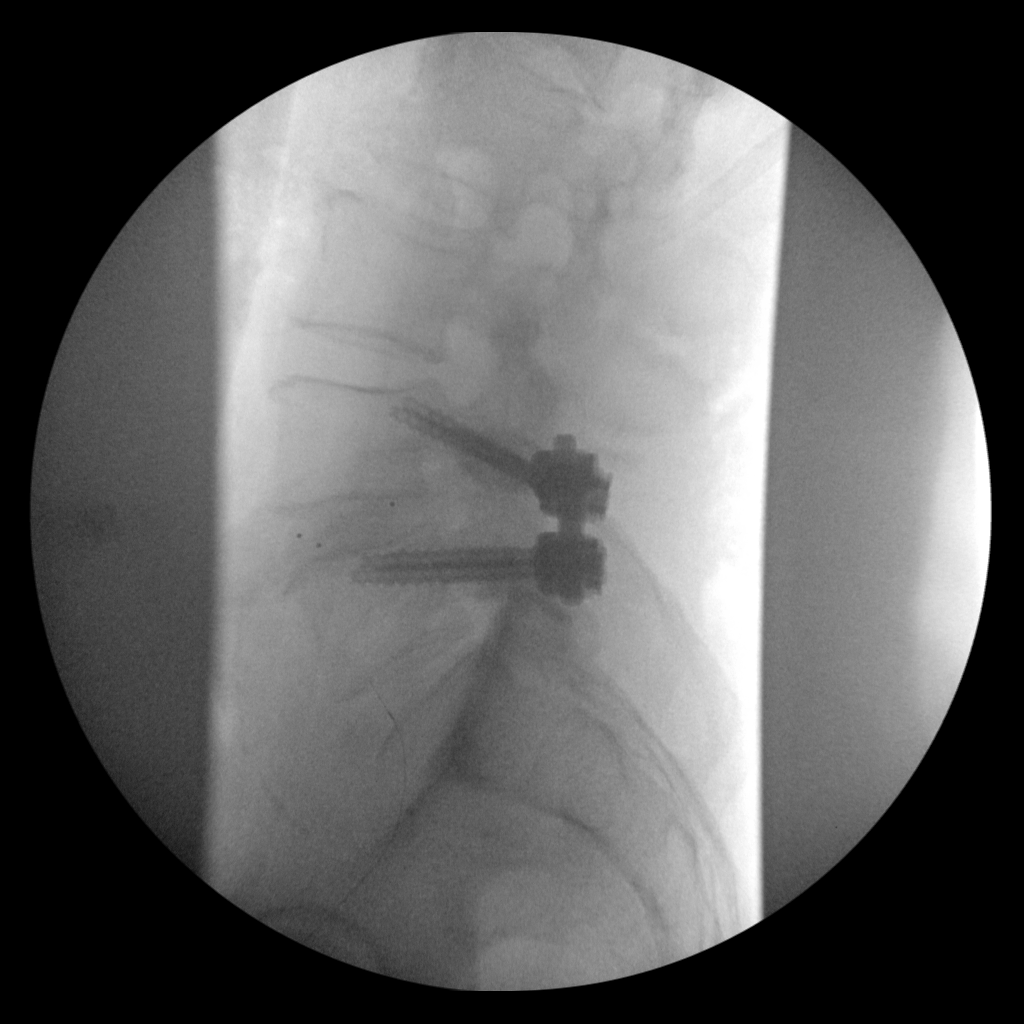
[im 2/2]
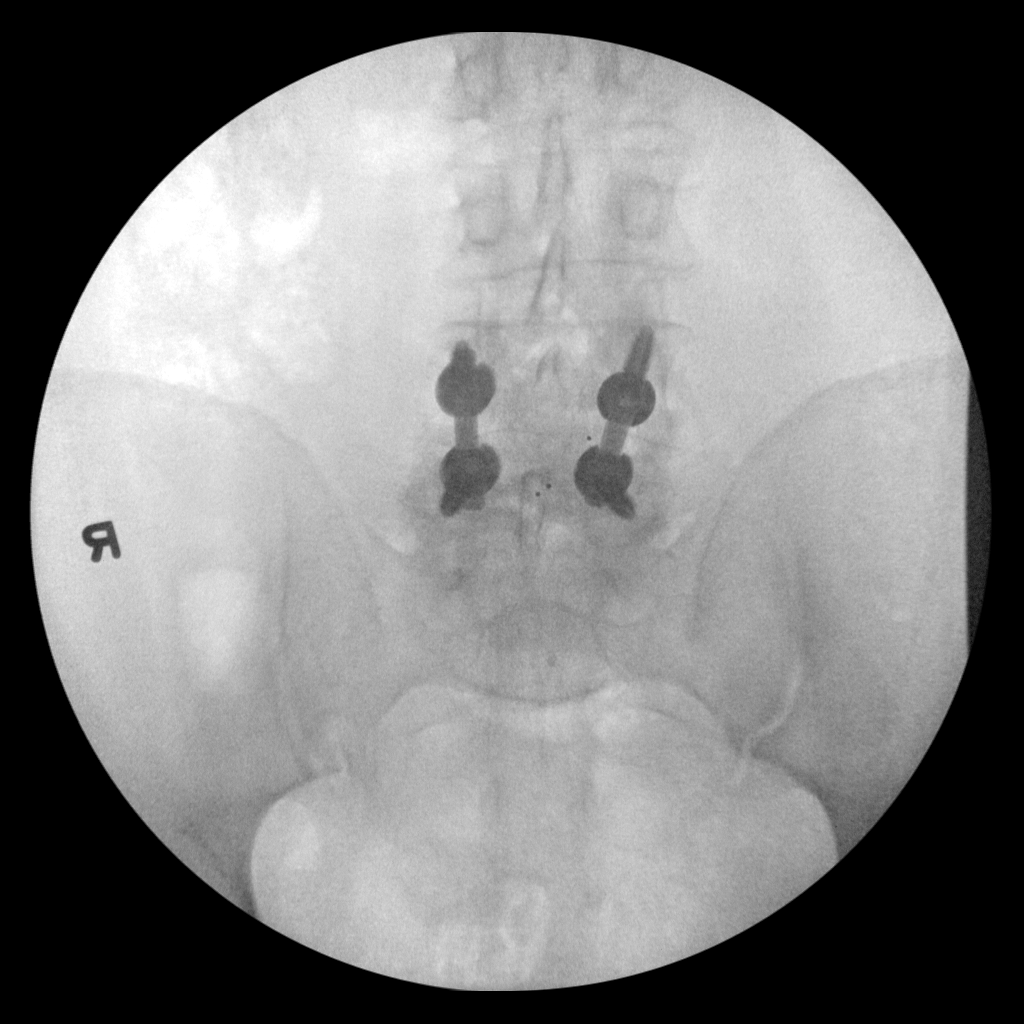

[2 of 2 positions shown; findings below may reference images not displayed]

FINDINGS: Pedicle rods and screws have been inserted at L4 and L5. A disc
spacer device is seen at the same level.
IMPRESSION: Pedicle rod and screw placement at L4-5 as above.

## 2016-10-29 SURGERY — POSTERIOR LUMBAR FUSION 1 LEVEL
Anesthesia: General | Site: Spine Lumbar | Laterality: Left

## 2016-10-29 MED ORDER — FENTANYL CITRATE (PF) 100 MCG/2ML IJ SOLN
INTRAMUSCULAR | Status: AC
  Filled 2016-10-29: qty 2

## 2016-10-29 MED ORDER — GLYCOPYRROLATE 0.2 MG/ML IJ SOLN
INTRAMUSCULAR | Status: DC | PRN
  Administered 2016-10-29: .5 mg via INTRAVENOUS

## 2016-10-29 MED ORDER — 0.9 % SODIUM CHLORIDE (POUR BTL) OPTIME
TOPICAL | Status: DC | PRN
  Administered 2016-10-29 (×3): 1000 mL

## 2016-10-29 MED ORDER — PROPOFOL 500 MG/50ML IV EMUL
INTRAVENOUS | Status: DC | PRN
  Administered 2016-10-29: 75 ug/kg/min via INTRAVENOUS

## 2016-10-29 MED ORDER — PROPOFOL 10 MG/ML IV BOLUS
INTRAVENOUS | Status: DC | PRN
  Administered 2016-10-29 (×2): 50 mg via INTRAVENOUS
  Administered 2016-10-29: 200 mg via INTRAVENOUS
  Administered 2016-10-29: 50 mg via INTRAVENOUS

## 2016-10-29 MED ORDER — PANTOPRAZOLE SODIUM 40 MG PO TBEC: 80.0000 mg | DELAYED_RELEASE_TABLET | Freq: Every day | ORAL | Status: DC

## 2016-10-29 MED ORDER — BISACODYL 5 MG PO TBEC: 5.0000 mg | DELAYED_RELEASE_TABLET | Freq: Every day | ORAL | Status: DC | PRN

## 2016-10-29 MED ORDER — CEFAZOLIN SODIUM-DEXTROSE 2-4 GM/100ML-% IV SOLN
INTRAVENOUS | Status: AC
  Filled 2016-10-29: qty 100

## 2016-10-29 MED ORDER — ALUM & MAG HYDROXIDE-SIMETH 200-200-20 MG/5ML PO SUSP: 30.0000 mL | Freq: Four times a day (QID) | ORAL | Status: DC | PRN

## 2016-10-29 MED ORDER — ROCURONIUM BROMIDE 100 MG/10ML IV SOLN
INTRAVENOUS | Status: DC | PRN
  Administered 2016-10-29: 10 mg via INTRAVENOUS
  Administered 2016-10-29: 40 mg via INTRAVENOUS

## 2016-10-29 MED ORDER — BUPIVACAINE HCL (PF) 0.25 % IJ SOLN
INTRAMUSCULAR | Status: AC
  Filled 2016-10-29: qty 30

## 2016-10-29 MED ORDER — OXYMETAZOLINE HCL 0.05 % NA SOLN: 1.0000 | Freq: Every day | NASAL | Status: DC | PRN

## 2016-10-29 MED ORDER — EPINEPHRINE PF 1 MG/ML IJ SOLN
INTRAMUSCULAR | Status: AC
Start: 2016-10-29 — End: 2016-10-29
  Filled 2016-10-29: qty 1

## 2016-10-29 MED ORDER — HYDROMORPHONE HCL 1 MG/ML IJ SOLN
INTRAMUSCULAR | Status: AC
  Filled 2016-10-29: qty 0.5

## 2016-10-29 MED ORDER — ARTIFICIAL TEARS OP OINT
TOPICAL_OINTMENT | OPHTHALMIC | Status: DC | PRN
  Administered 2016-10-29: 1 via OPHTHALMIC

## 2016-10-29 MED ORDER — METHYLENE BLUE 0.5 % INJ SOLN
INTRAVENOUS | Status: AC
  Filled 2016-10-29: qty 10

## 2016-10-29 MED ORDER — ONDANSETRON HCL 4 MG/2ML IJ SOLN
INTRAMUSCULAR | Status: DC | PRN
  Administered 2016-10-29: 4 mg via INTRAVENOUS

## 2016-10-29 MED ORDER — THROMBIN 20000 UNITS EX SOLR
CUTANEOUS | Status: DC | PRN
Start: 2016-10-29 — End: 2016-10-29
  Administered 2016-10-29: 20000 [IU] via TOPICAL

## 2016-10-29 MED ORDER — ACETAMINOPHEN 500 MG PO TABS: 1000.0000 mg | ORAL_TABLET | Freq: Four times a day (QID) | ORAL | Status: DC | PRN

## 2016-10-29 MED ORDER — MENTHOL 3 MG MT LOZG: 1.0000 | LOZENGE | OROMUCOSAL | Status: DC | PRN

## 2016-10-29 MED ORDER — SODIUM CHLORIDE 0.9 % IV SOLN: 250.0000 mL | INTRAVENOUS | Status: DC

## 2016-10-29 MED ORDER — DIAZEPAM 5 MG PO TABS
5.0000 mg | ORAL_TABLET | Freq: Four times a day (QID) | ORAL | Status: DC | PRN
  Administered 2016-10-29 – 2016-10-30 (×2): 5 mg via ORAL
  Filled 2016-10-29: qty 1

## 2016-10-29 MED ORDER — PROMETHAZINE HCL 25 MG/ML IJ SOLN: 6.2500 mg | INTRAMUSCULAR | Status: DC | PRN

## 2016-10-29 MED ORDER — SENNOSIDES-DOCUSATE SODIUM 8.6-50 MG PO TABS: 1.0000 | ORAL_TABLET | Freq: Every evening | ORAL | Status: DC | PRN

## 2016-10-29 MED ORDER — PHENYLEPHRINE HCL 10 MG/ML IJ SOLN
INTRAVENOUS | Status: DC | PRN
  Administered 2016-10-29: 75 ug/min via INTRAVENOUS

## 2016-10-29 MED ORDER — FLEET ENEMA 7-19 GM/118ML RE ENEM: 1.0000 | ENEMA | Freq: Once | RECTAL | Status: DC | PRN

## 2016-10-29 MED ORDER — NEOSTIGMINE METHYLSULFATE 5 MG/5ML IV SOSY
PREFILLED_SYRINGE | INTRAVENOUS | Status: AC
  Filled 2016-10-29: qty 5

## 2016-10-29 MED ORDER — ZOLPIDEM TARTRATE 5 MG PO TABS: 5.0000 mg | ORAL_TABLET | Freq: Every evening | ORAL | Status: DC | PRN

## 2016-10-29 MED ORDER — LACTATED RINGERS IV SOLN
INTRAVENOUS | Status: DC
  Administered 2016-10-29: 50 mL/h via INTRAVENOUS
  Administered 2016-10-29 (×2): via INTRAVENOUS

## 2016-10-29 MED ORDER — SODIUM CHLORIDE 0.9% FLUSH
3.0000 mL | Freq: Two times a day (BID) | INTRAVENOUS | Status: DC
  Administered 2016-10-29 – 2016-10-30 (×2): 3 mL via INTRAVENOUS

## 2016-10-29 MED ORDER — SODIUM CHLORIDE 0.9% FLUSH: 3.0000 mL | INTRAVENOUS | Status: DC | PRN

## 2016-10-29 MED ORDER — PHENYLEPHRINE HCL 10 MG/ML IJ SOLN
INTRAMUSCULAR | Status: DC | PRN
  Administered 2016-10-29 (×2): 80 ug via INTRAVENOUS

## 2016-10-29 MED ORDER — ALBUTEROL SULFATE (2.5 MG/3ML) 0.083% IN NEBU: 3.0000 mL | INHALATION_SOLUTION | Freq: Four times a day (QID) | RESPIRATORY_TRACT | Status: DC | PRN

## 2016-10-29 MED ORDER — SUCCINYLCHOLINE CHLORIDE 20 MG/ML IJ SOLN
INTRAMUSCULAR | Status: DC | PRN
  Administered 2016-10-29: 120 mg via INTRAVENOUS

## 2016-10-29 MED ORDER — MONTELUKAST SODIUM 10 MG PO TABS
10.0000 mg | ORAL_TABLET | Freq: Every day | ORAL | Status: DC
  Administered 2016-10-29: 10 mg via ORAL
  Filled 2016-10-29: qty 1

## 2016-10-29 MED ORDER — NEOSTIGMINE METHYLSULFATE 10 MG/10ML IV SOLN
INTRAVENOUS | Status: DC | PRN
  Administered 2016-10-29: 2.5 mg via INTRAVENOUS

## 2016-10-29 MED ORDER — POTASSIUM CHLORIDE IN NACL 20-0.9 MEQ/L-% IV SOLN: INTRAVENOUS | Status: DC

## 2016-10-29 MED ORDER — MOMETASONE FURO-FORMOTEROL FUM 200-5 MCG/ACT IN AERO
2.0000 | INHALATION_SPRAY | Freq: Two times a day (BID) | RESPIRATORY_TRACT | Status: DC
  Administered 2016-10-29 – 2016-10-30 (×2): 2 via RESPIRATORY_TRACT
  Filled 2016-10-29: qty 8.8

## 2016-10-29 MED ORDER — CEFAZOLIN SODIUM-DEXTROSE 2-4 GM/100ML-% IV SOLN
2.0000 g | Freq: Three times a day (TID) | INTRAVENOUS | Status: AC
  Administered 2016-10-29 – 2016-10-30 (×2): 2 g via INTRAVENOUS
  Filled 2016-10-29 (×2): qty 100

## 2016-10-29 MED ORDER — LIDOCAINE 2% (20 MG/ML) 5 ML SYRINGE
INTRAMUSCULAR | Status: AC
  Filled 2016-10-29: qty 5

## 2016-10-29 MED ORDER — PROPOFOL 10 MG/ML IV BOLUS
INTRAVENOUS | Status: AC
  Filled 2016-10-29: qty 40

## 2016-10-29 MED ORDER — METHYLENE BLUE 0.5 % INJ SOLN
INTRAVENOUS | Status: DC | PRN
  Administered 2016-10-29: 1 mL via SUBMUCOSAL

## 2016-10-29 MED ORDER — FENTANYL CITRATE (PF) 100 MCG/2ML IJ SOLN
INTRAMUSCULAR | Status: DC | PRN
  Administered 2016-10-29 (×3): 50 ug via INTRAVENOUS
  Administered 2016-10-29: 100 ug via INTRAVENOUS
  Administered 2016-10-29: 50 ug via INTRAVENOUS

## 2016-10-29 MED ORDER — BUPIVACAINE LIPOSOME 1.3 % IJ SUSP
20.0000 mL | INTRAMUSCULAR | Status: AC
  Administered 2016-10-29: 20 mL
  Filled 2016-10-29: qty 20

## 2016-10-29 MED ORDER — EPINEPHRINE PF 1 MG/ML IJ SOLN
INTRAMUSCULAR | Status: DC | PRN
  Administered 2016-10-29 (×2): .15 mL

## 2016-10-29 MED ORDER — THROMBIN 20000 UNITS EX SOLR
CUTANEOUS | Status: AC
  Filled 2016-10-29: qty 20000

## 2016-10-29 MED ORDER — EPHEDRINE SULFATE 50 MG/ML IJ SOLN
INTRAMUSCULAR | Status: DC | PRN
  Administered 2016-10-29 (×2): 10 mg via INTRAVENOUS

## 2016-10-29 MED ORDER — DOCUSATE SODIUM 100 MG PO CAPS
100.0000 mg | ORAL_CAPSULE | Freq: Two times a day (BID) | ORAL | Status: DC
  Administered 2016-10-29 – 2016-10-30 (×2): 100 mg via ORAL
  Filled 2016-10-29 (×2): qty 1

## 2016-10-29 MED ORDER — DIAZEPAM 5 MG PO TABS
ORAL_TABLET | ORAL | Status: AC
  Filled 2016-10-29: qty 1

## 2016-10-29 MED ORDER — OXYCODONE-ACETAMINOPHEN 5-325 MG PO TABS
1.0000 | ORAL_TABLET | ORAL | Status: DC | PRN
  Administered 2016-10-30 (×3): 2 via ORAL
  Filled 2016-10-29 (×3): qty 2

## 2016-10-29 MED ORDER — MIDAZOLAM HCL 2 MG/2ML IJ SOLN
INTRAMUSCULAR | Status: AC
  Filled 2016-10-29: qty 2

## 2016-10-29 MED ORDER — HEMOSTATIC AGENTS (NO CHARGE) OPTIME
TOPICAL | Status: DC | PRN
  Administered 2016-10-29: 1 via TOPICAL

## 2016-10-29 MED ORDER — ONDANSETRON HCL 4 MG/2ML IJ SOLN
INTRAMUSCULAR | Status: AC
  Filled 2016-10-29: qty 2

## 2016-10-29 MED ORDER — POVIDONE-IODINE 7.5 % EX SOLN
Freq: Once | CUTANEOUS | Status: DC
  Filled 2016-10-29: qty 118

## 2016-10-29 MED ORDER — HYDROMORPHONE HCL 1 MG/ML IJ SOLN
0.2500 mg | INTRAMUSCULAR | Status: DC | PRN
  Administered 2016-10-29: 0.5 mg via INTRAVENOUS

## 2016-10-29 MED ORDER — EPINEPHRINE PF 1 MG/ML IJ SOLN
INTRAMUSCULAR | Status: AC
  Filled 2016-10-29: qty 1

## 2016-10-29 MED ORDER — MORPHINE SULFATE (PF) 4 MG/ML IV SOLN
1.0000 mg | INTRAVENOUS | Status: DC | PRN
  Administered 2016-10-29: 4 mg via INTRAVENOUS
  Filled 2016-10-29: qty 1

## 2016-10-29 MED ORDER — AMLODIPINE BESYLATE 5 MG PO TABS
5.0000 mg | ORAL_TABLET | ORAL | Status: DC
  Administered 2016-10-30: 5 mg via ORAL
  Filled 2016-10-29: qty 1

## 2016-10-29 MED ORDER — CEFAZOLIN SODIUM-DEXTROSE 2-4 GM/100ML-% IV SOLN
2.0000 g | INTRAVENOUS | Status: AC
  Administered 2016-10-29: 2 g via INTRAVENOUS

## 2016-10-29 MED ORDER — PHENOL 1.4 % MT LIQD: 1.0000 | OROMUCOSAL | Status: DC | PRN

## 2016-10-29 MED ORDER — BUPIVACAINE HCL (PF) 0.25 % IJ SOLN
INTRAMUSCULAR | Status: DC | PRN
  Administered 2016-10-29: 5 mL
  Administered 2016-10-29: 20 mL

## 2016-10-29 MED ORDER — LIDOCAINE HCL (CARDIAC) 20 MG/ML IV SOLN
INTRAVENOUS | Status: DC | PRN
  Administered 2016-10-29: 60 mg via INTRAVENOUS

## 2016-10-29 MED ORDER — ONDANSETRON HCL 4 MG/2ML IJ SOLN
4.0000 mg | INTRAMUSCULAR | Status: DC | PRN
Start: 2016-10-29 — End: 2016-10-30

## 2016-10-29 MED ORDER — ACETAMINOPHEN 650 MG RE SUPP: 650.0000 mg | RECTAL | Status: DC | PRN

## 2016-10-29 MED ORDER — PROPOFOL 10 MG/ML IV BOLUS
INTRAVENOUS | Status: AC
  Filled 2016-10-29: qty 20

## 2016-10-29 MED ORDER — ACETAMINOPHEN 325 MG PO TABS: 650.0000 mg | ORAL_TABLET | ORAL | Status: DC | PRN

## 2016-10-29 MED ORDER — ALBUMIN HUMAN 5 % IV SOLN
INTRAVENOUS | Status: DC | PRN
  Administered 2016-10-29: 13:00:00 via INTRAVENOUS

## 2016-10-29 MED ORDER — FENTANYL CITRATE (PF) 100 MCG/2ML IJ SOLN
INTRAMUSCULAR | Status: AC
  Filled 2016-10-29: qty 4

## 2016-10-29 SURGICAL SUPPLY — 101 items
APL SKNCLS STERI-STRIP NONHPOA (GAUZE/BANDAGES/DRESSINGS) ×1
BENZOIN TINCTURE PRP APPL 2/3 (GAUZE/BANDAGES/DRESSINGS) ×3 IMPLANT
BIT DRILL 3.2 (BIT) ×3
BIT DRILL 65X3.2XQC STP NS (BIT) IMPLANT
BIT DRL 65X3.2XQC STP NS (BIT) ×1
BONE VIVIGEN FORMABLE 10CC (Bone Implant) ×3 IMPLANT
BUR PRESCISION 1.7 ELITE (BURR) ×3 IMPLANT
BUR ROUND PRECISION 4.0 (BURR) IMPLANT
BUR ROUND PRECISION 4.0MM (BURR)
BUR SABER RD CUTTING 3.0 (BURR) IMPLANT
BUR SABER RD CUTTING 3.0MM (BURR)
CAGE CONCORDE BULLET 9X11X27 (Cage) ×2 IMPLANT
CAGE CONCORDE BULLET 9X11X27MM (Cage) ×1 IMPLANT
CAGE SPNL 5D BLT NOSE 27X9X11 (Cage) IMPLANT
CARTRIDGE OIL MAESTRO DRILL (MISCELLANEOUS) ×1 IMPLANT
CLOSURE STERI-STRIP 1/2X4 (GAUZE/BANDAGES/DRESSINGS) ×1
CLOSURE WOUND 1/2 X4 (GAUZE/BANDAGES/DRESSINGS) ×2
CLSR STERI-STRIP ANTIMIC 1/2X4 (GAUZE/BANDAGES/DRESSINGS) ×1 IMPLANT
CONT SPEC STER OR (MISCELLANEOUS) ×3 IMPLANT
COVER MAYO STAND STRL (DRAPES) ×4 IMPLANT
COVER SURGICAL LIGHT HANDLE (MISCELLANEOUS) ×3 IMPLANT
DECANTER SPIKE VIAL GLASS SM (MISCELLANEOUS) ×2 IMPLANT
DIFFUSER DRILL AIR PNEUMATIC (MISCELLANEOUS) ×3 IMPLANT
DRAIN CHANNEL 15F RND FF W/TCR (WOUND CARE) ×2 IMPLANT
DRAPE C-ARM 42X72 X-RAY (DRAPES) ×3 IMPLANT
DRAPE C-ARMOR (DRAPES) ×2 IMPLANT
DRAPE POUCH INSTRU U-SHP 10X18 (DRAPES) ×3 IMPLANT
DRAPE SURG 17X23 STRL (DRAPES) ×12 IMPLANT
DRSG MEPILEX BORDER 4X8 (GAUZE/BANDAGES/DRESSINGS) ×2 IMPLANT
DURAPREP 26ML APPLICATOR (WOUND CARE) ×3 IMPLANT
ELECT BLADE 4.0 EZ CLEAN MEGAD (MISCELLANEOUS) ×3
ELECT CAUTERY BLADE 6.4 (BLADE) ×3 IMPLANT
ELECT REM PT RETURN 9FT ADLT (ELECTROSURGICAL) ×3
ELECTRODE BLDE 4.0 EZ CLN MEGD (MISCELLANEOUS) ×1 IMPLANT
ELECTRODE REM PT RTRN 9FT ADLT (ELECTROSURGICAL) ×1 IMPLANT
EVACUATOR SILICONE 100CC (DRAIN) ×2 IMPLANT
FEE INTRAOP MONITOR IMPULS NCS (MISCELLANEOUS) IMPLANT
GAUZE SPONGE 4X4 12PLY STRL (GAUZE/BANDAGES/DRESSINGS) ×3 IMPLANT
GAUZE SPONGE 4X4 16PLY XRAY LF (GAUZE/BANDAGES/DRESSINGS) ×3 IMPLANT
GLOVE BIO SURGEON STRL SZ7 (GLOVE) ×7 IMPLANT
GLOVE BIO SURGEON STRL SZ8 (GLOVE) ×3 IMPLANT
GLOVE BIOGEL PI IND STRL 6.5 (GLOVE) IMPLANT
GLOVE BIOGEL PI IND STRL 7.0 (GLOVE) ×1 IMPLANT
GLOVE BIOGEL PI IND STRL 8 (GLOVE) ×1 IMPLANT
GLOVE BIOGEL PI INDICATOR 6.5 (GLOVE) ×6
GLOVE BIOGEL PI INDICATOR 7.0 (GLOVE) ×8
GLOVE BIOGEL PI INDICATOR 8 (GLOVE) ×2
GOWN STRL REUS W/ TWL LRG LVL3 (GOWN DISPOSABLE) ×2 IMPLANT
GOWN STRL REUS W/ TWL XL LVL3 (GOWN DISPOSABLE) ×1 IMPLANT
GOWN STRL REUS W/TWL LRG LVL3 (GOWN DISPOSABLE) ×6
GOWN STRL REUS W/TWL XL LVL3 (GOWN DISPOSABLE) ×3
GRAFT BNE MATRIX VG FRMBL L 10 (Bone Implant) IMPLANT
INTRAOP MONITOR FEE IMPULS NCS (MISCELLANEOUS) ×1
INTRAOP MONITOR FEE IMPULSE (MISCELLANEOUS) ×2
IV CATH 14GX2 1/4 (CATHETERS) ×3 IMPLANT
KIT BASIN OR (CUSTOM PROCEDURE TRAY) ×3 IMPLANT
KIT POSITION SURG JACKSON T1 (MISCELLANEOUS) ×3 IMPLANT
KIT ROOM TURNOVER OR (KITS) ×3 IMPLANT
MARKER SKIN DUAL TIP RULER LAB (MISCELLANEOUS) ×3 IMPLANT
NDL HYPO 25GX1X1/2 BEV (NEEDLE) ×1 IMPLANT
NDL SAFETY ECLIPSE 18X1.5 (NEEDLE) ×1 IMPLANT
NDL SPNL 18GX3.5 QUINCKE PK (NEEDLE) ×2 IMPLANT
NEEDLE 22X1 1/2 (OR ONLY) (NEEDLE) ×3 IMPLANT
NEEDLE HYPO 18GX1.5 SHARP (NEEDLE) ×3
NEEDLE HYPO 25GX1X1/2 BEV (NEEDLE) ×3 IMPLANT
NEEDLE SPNL 18GX3.5 QUINCKE PK (NEEDLE) ×6 IMPLANT
NS IRRIG 1000ML POUR BTL (IV SOLUTION) ×3 IMPLANT
OIL CARTRIDGE MAESTRO DRILL (MISCELLANEOUS) ×3
PACK LAMINECTOMY ORTHO (CUSTOM PROCEDURE TRAY) ×3 IMPLANT
PACK UNIVERSAL I (CUSTOM PROCEDURE TRAY) ×3 IMPLANT
PAD ARMBOARD 7.5X6 YLW CONV (MISCELLANEOUS) ×6 IMPLANT
PATTIES SURGICAL .5 X1 (DISPOSABLE) ×3 IMPLANT
PATTIES SURGICAL .5X1.5 (GAUZE/BANDAGES/DRESSINGS) ×3 IMPLANT
PROBE PEDCLE PROBE MAGSTM DISP (MISCELLANEOUS) ×2 IMPLANT
ROD EXPEDIUM 30MM (Rod) ×2 IMPLANT
ROD PRE BENT EXPEDIUM 35MM (Rod) ×2 IMPLANT
SCREW SET SINGLE INNER (Screw) ×8 IMPLANT
SCREW VIPER CORT FIX 6X35 (Screw) ×4 IMPLANT
SCREW VIPER CORTICAL FIX 6X40 (Screw) ×4 IMPLANT
SPONGE GAUZE 4X4 12PLY STER LF (GAUZE/BANDAGES/DRESSINGS) ×2 IMPLANT
SPONGE INTESTINAL PEANUT (DISPOSABLE) ×3 IMPLANT
SPONGE SURGIFOAM ABS GEL 100 (HEMOSTASIS) ×3 IMPLANT
STRIP CLOSURE SKIN 1/2X4 (GAUZE/BANDAGES/DRESSINGS) ×4 IMPLANT
SURGIFLO W/THROMBIN 8M KIT (HEMOSTASIS) IMPLANT
SUT ETHILON 3 0 PS 1 (SUTURE) ×2 IMPLANT
SUT MNCRL AB 4-0 PS2 18 (SUTURE) ×3 IMPLANT
SUT VIC AB 0 CT1 18XCR BRD 8 (SUTURE) ×1 IMPLANT
SUT VIC AB 0 CT1 8-18 (SUTURE) ×3
SUT VIC AB 1 CT1 18XCR BRD 8 (SUTURE) ×1 IMPLANT
SUT VIC AB 1 CT1 8-18 (SUTURE) ×3
SUT VIC AB 2-0 CT2 18 VCP726D (SUTURE) ×3 IMPLANT
SYR 20CC LL (SYRINGE) ×3 IMPLANT
SYR BULB IRRIGATION 50ML (SYRINGE) ×3 IMPLANT
SYR CONTROL 10ML LL (SYRINGE) ×8 IMPLANT
SYR TB 1ML LUER SLIP (SYRINGE) ×5 IMPLANT
TAPE CLOTH SURG 4X10 WHT LF (GAUZE/BANDAGES/DRESSINGS) ×2 IMPLANT
TOWEL OR 17X24 6PK STRL BLUE (TOWEL DISPOSABLE) ×3 IMPLANT
TOWEL OR 17X26 10 PK STRL BLUE (TOWEL DISPOSABLE) ×3 IMPLANT
TRAY FOLEY CATH 16FRSI W/METER (SET/KITS/TRAYS/PACK) ×3 IMPLANT
WATER STERILE IRR 1000ML POUR (IV SOLUTION) ×3 IMPLANT
YANKAUER SUCT BULB TIP NO VENT (SUCTIONS) ×3 IMPLANT

## 2016-10-29 NOTE — Anesthesia Procedure Notes (Signed)
Procedure Name: Intubation Date/Time: 10/29/2016 11:38 AM Performed by: Scheryl Darter Pre-anesthesia Checklist: Patient identified, Emergency Drugs available, Suction available and Patient being monitored Patient Re-evaluated:Patient Re-evaluated prior to inductionOxygen Delivery Method: Circle System Utilized Preoxygenation: Pre-oxygenation with 100% oxygen Intubation Type: IV induction Ventilation: Mask ventilation without difficulty Laryngoscope Size: Miller and 3 Grade View: Grade II Tube type: Oral Tube size: 7.5 mm Number of attempts: 1 Airway Equipment and Method: Stylet and Oral airway Placement Confirmation: ETT inserted through vocal cords under direct vision,  positive ETCO2 and breath sounds checked- equal and bilateral Secured at: 23 cm Tube secured with: Tape Dental Injury: Teeth and Oropharynx as per pre-operative assessment

## 2016-10-29 NOTE — Transfer of Care (Signed)
Immediate Anesthesia Transfer of Care Note  Patient: Tony Roberts  Procedure(s) Performed: Procedure(s) with comments: LEFT SIDED LUMBAR 4-5 TRANSFORAMINAL LUMBAR INTERBODY FUSION WITH INSTRUMENTATION AND ALLOGRAFT (Left) - LEFT SIDED LUMBAR 4-5 TRANSFORAMINAL LUMBAR INTERBODY FUSION WITH INSTRUMENTATION AND ALLOGRAFT  Patient Location: PACU  Anesthesia Type:General  Level of Consciousness: awake, alert  and oriented  Airway & Oxygen Therapy: Patient Spontanous Breathing and Patient connected to nasal cannula oxygen  Post-op Assessment: Report given to RN, Post -op Vital signs reviewed and stable and Patient moving all extremities X 4  Post vital signs: Reviewed and stable  Last Vitals:  Vitals:   10/29/16 1020 10/29/16 1034  BP: (!) 158/109 (!) 146/97  Pulse: 90   Resp: 20   Temp: 36.8 C     Last Pain:  Vitals:   10/29/16 1020  TempSrc: Oral  PainSc:       Patients Stated Pain Goal: 8 [id number] [phone])  Complications: No apparent anesthesia complications

## 2016-10-29 NOTE — Anesthesia Preprocedure Evaluation (Signed)
Anesthesia Evaluation  Patient identified by MRN, date of birth, ID band Patient awake    Reviewed: Allergy & Precautions, NPO status , Patient's Chart, lab work & pertinent test results  Airway Mallampati: II  TM Distance: >3 FB Neck ROM: Full    Dental  (+) Dental Advisory Given   Pulmonary asthma ,    breath sounds clear to auscultation       Cardiovascular negative cardio ROS   Rhythm:Regular Rate:Normal     Neuro/Psych negative neurological ROS     GI/Hepatic Neg liver ROS, GERD  Medicated,  Endo/Other  negative endocrine ROS  Renal/GU negative Renal ROS     Musculoskeletal  (+) Arthritis ,   Abdominal   Peds  Hematology negative hematology ROS (+)   Anesthesia Other Findings   Reproductive/Obstetrics                             Lab Results  Component Value Date   WBC 5.9 10/23/2016   HGB 13.3 10/23/2016   HCT 41.1 10/23/2016   MCV 82.5 10/23/2016   PLT 160 10/23/2016   Lab Results  Component Value Date   CREATININE 0.94 10/23/2016   BUN 11 10/23/2016   NA 139 10/23/2016   K 3.5 10/23/2016   CL 105 10/23/2016   CO2 25 10/23/2016    Anesthesia Physical Anesthesia Plan  ASA: II  Anesthesia Plan: General   Post-op Pain Management:    Induction: Intravenous  Airway Management Planned: Oral ETT  Additional Equipment:   Intra-op Plan:   Post-operative Plan: Extubation in OR  Informed Consent: I have reviewed the patients History and Physical, chart, labs and discussed the procedure including the risks, benefits and alternatives for the proposed anesthesia with the patient or authorized representative who has indicated his/her understanding and acceptance.   Dental advisory given  Plan Discussed with: CRNA  Anesthesia Plan Comments:         Anesthesia Quick Evaluation

## 2016-10-29 NOTE — H&P (Signed)
PREOPERATIVE H&P  Chief Complaint: Bilateral leg pain  HPI: Tony Roberts is a 76 y.o. male who presents with ongoing pain in the bilateral legs  MRI reveals severe spinal stenosis at L4/5 and xrays reveal a spondylolisthesis at L4/5 as well  Patient has failed multiple forms of conservative care and continues to have pain (see office notes for additional details regarding the patient's full course of treatment)  Past Medical History:  Diagnosis Date  . Arthritis   . Asthma    last asthma attack at age 39  . GERD (gastroesophageal reflux disease)   . Headache   . Other specified iron deficiency anemias   . PAC (premature atrial contraction)    occasional PAC's   Past Surgical History:  Procedure Laterality Date  . CATARACT EXTRACTION    . HERNIA REPAIR    . TONSILLECTOMY     Social History   Social History  . Marital status: Married    Spouse name: N/A  . Number of children: N/A  . Years of education: N/A   Social History Main Topics  . Smoking status: Never Smoker  . Smokeless tobacco: Never Used  . Alcohol use No  . Drug use: No  . Sexual activity: Not on file   Other Topics Concern  . Not on file   Social History Narrative  . No narrative on file   Family History  Problem Relation Age of Onset  . Alzheimer's disease Mother   . Asthma Father    Allergies  Allergen Reactions  . No Known Allergies    Prior to Admission medications   Medication Sig Start Date End Date Taking? Authorizing Provider  acetaminophen (TYLENOL) 500 MG tablet Take 1,000 mg by mouth every 6 (six) hours as needed for moderate pain or headache.   Yes Historical Provider, MD  albuterol (PROVENTIL HFA) 108 (90 Base) MCG/ACT inhaler Inhale two puffs every four to six hours as needed for cough or wheeze. 05/19/16  Yes Jiles Prows, MD  amLODipine (NORVASC) 5 MG tablet Take 5 mg by mouth every other day.  12/18/15  Yes Historical Provider, MD  esomeprazole (NEXIUM) 40 MG capsule  TAKE 1 CAPSULE BY MOUTH ONCE DAILY FOR REFLUX 03/18/16  Yes Jiles Prows, MD  Fluticasone-Salmeterol (ADVAIR) 250-50 MCG/DOSE AEPB Use one inhalation one to two times a day to prevent cough or wheeze.  Rinse, gargle, and spit after use. Patient taking differently: Inhale 1 puff into the lungs daily. Rinse, gargle, and spit after use. 03/18/16  Yes Jiles Prows, MD  montelukast (SINGULAIR) 10 MG tablet Take 1 tablet (10 mg total) by mouth at bedtime. 03/18/16  Yes Jiles Prows, MD  naproxen sodium (ANAPROX) 220 MG tablet Take 440 mg by mouth 2 (two) times daily as needed (pain).   Yes Historical Provider, MD  oxymetazoline (AFRIN) 0.05 % nasal spray Place 1 spray into both nostrils daily as needed for congestion.   Yes Historical Provider, MD  traMADol (ULTRAM) 50 MG tablet Take 50 mg by mouth daily as needed for pain. 08/17/16  Yes Historical Provider, MD     All other systems have been reviewed and were otherwise negative with the exception of those mentioned in the HPI and as above.  Physical Exam: There were no vitals filed for this visit.  General: Alert, no acute distress Cardiovascular: No pedal edema Respiratory: No cyanosis, no use of accessory musculature Skin: No lesions in the area of chief complaint  Neurologic: Sensation intact distally Psychiatric: Patient is competent for consent with normal mood and affect Lymphatic: No axillary or cervical lymphadenopathy  MUSCULOSKELETAL: patient walks with a slow and shuffling gait  Assessment/Plan: Bilateral leg pain Plan for Procedure(s): LEFT SIDED LUMBAR 4-5 TRANSFORAMINAL LUMBAR INTERBODY FUSION WITH INSTRUMENTATION AND ALLOGRAFT   Sinclair Ship, MD 10/29/2016 7:08 AM

## 2016-10-29 NOTE — Anesthesia Postprocedure Evaluation (Addendum)
Anesthesia Post Note  Patient: Tony Roberts  Procedure(s) Performed: Procedure(s) (LRB): LEFT SIDED LUMBAR 4-5 TRANSFORAMINAL LUMBAR INTERBODY FUSION WITH INSTRUMENTATION AND ALLOGRAFT (Left)  Patient location during evaluation: PACU Anesthesia Type: General Level of consciousness: sedated Pain management: pain level controlled Vital Signs Assessment: post-procedure vital signs reviewed and stable Respiratory status: spontaneous breathing and respiratory function stable Cardiovascular status: stable Anesthetic complications: no       Last Vitals:  Vitals:   10/29/16 1600 10/29/16 1615  BP: (!) 145/92 130/82  Pulse: 86 75  Resp: 13 15  Temp:      Last Pain:  Vitals:   10/29/16 1600  TempSrc:   PainSc: 0-No pain                 Libni Fusaro DANIEL

## 2016-10-30 LAB — POCT I-STAT 4, (NA,K, GLUC, HGB,HCT)
Glucose, Bld: 100 mg/dL — ABNORMAL HIGH (ref 65–99)
HCT: 34 % — ABNORMAL LOW (ref 39.0–52.0)
HEMOGLOBIN: 11.6 g/dL — AB (ref 13.0–17.0)
POTASSIUM: 3.3 mmol/L — AB (ref 3.5–5.1)
Sodium: 140 mmol/L (ref 135–145)

## 2016-10-30 MED FILL — Sodium Chloride IV Soln 0.9%: INTRAVENOUS | Qty: 2000 | Status: AC

## 2016-10-30 MED FILL — Heparin Sodium (Porcine) Inj 1000 Unit/ML: INTRAMUSCULAR | Qty: 30 | Status: AC

## 2016-10-30 NOTE — Consult Note (Signed)
   [facility] CM Inpatient Consult   10/30/2016  DOIL BALFANZ 01/03/1941 [id number]   Came to visit Mr. Stopper bedside on behalf of [place] [facility] Wellness program for [employer] employees/dependents with [facility] insurance. Discussed discharge follow up call. He is agreeable. Provided contact information. Appreciative visit. Denies any Link to Wellness needs at this time.    Marthenia Rolling, MSN-Ed, RN,BSN [facility] Liaison [phone]

## 2016-10-30 NOTE — Op Note (Signed)
NAMEEARL, Tony Roberts NO.:  [id number]  MEDICAL RECORD NO.:  [id number]  LOCATION:                                 FACILITY:  PHYSICIAN:  Phylliss Bob, MD      DATE OF BIRTH:  [birth year]  DATE OF PROCEDURE:  10/29/2016                              OPERATIVE REPORT   PREOPERATIVE DIAGNOSES: 1. L4-5 spinal stenosis, severe. 2. Grade 1 L4-5 spondylolisthesis. 3. Bilateral leg pain secondary to spinal stenosis.  POSTOPERATIVE DIAGNOSES: 1. L4-5 spinal stenosis, severe. 2. Grade 1 L4-5 spondylolisthesis. 3. Bilateral leg pain secondary to spinal stenosis.  PROCEDURE: 1. Lumbar decompression, L4-5, including substantial bone and soft     tissue removal involving the right and left lateral recess. 2. Left-sided L4-5 transforaminal lumbar interbody fusion. 3. Right-sided L4-5 posterolateral fusion. 4. Insertion of interbody device x1 (Concorde bullet cage, 11 x 27 mm,     lordotic). 5. Placement of posterior instrumentation, L4, L5. 6. Use of local autograft. 7. Use of morselized allograft. 8. Intraoperative use of fluoroscopy.  SURGEON:  Phylliss Bob, MD.  ASSISTANTPricilla Holm, PA-C.  ANESTHESIA:  General endotracheal anesthesia.  COMPLICATIONS:  None.  DISPOSITION:  Stable.  ESTIMATED BLOOD LOSS:  250 mL.  INDICATIONS FOR SURGERY:  Briefly, Tony Roberts is a pleasant 76 year old male, who did present to me with severe debilitating pain in his bilateral legs.  The patient's MRI did reveal the findings outlined above.  The patient did fail appropriate conservative care, and did wish to proceed with the procedure reflected above.  The patient was fully aware of the risks and limitations of surgery and did elect to proceed.  OPERATIVE DETAILS:  On October 29, 2016, the patient was brought to surgery and general endotracheal anesthesia was administered.  The patient was placed prone on a well-padded flat Jackson bed with a Wilson frame.   Antibiotics were given.  The back was prepped and draped in the usual sterile fashion and a time-out was performed.  A midline incision was made overlying the L4-5 intervertebral space.  The fascia was incised at the midline.  I then suppressed and exposed the lamina of L4 and L5.  There was substantial and significant L4-5 facet hypertrophy, which was removed using a high-speed bur.  I did use fluoroscopy and anatomic landmarks in order to cannulate the L4 and L5 pedicles bilaterally, using a medial to lateral cortical trajectory technique. On the right side, I did place 6 mm screws of the appropriate length into the L4 and L5 pedicles.  Excellent purchase was noted.  A 35 mm rod was placed and distraction was applied across the rod.  Caps were then placed and provisionally tightened.  I then placed bone wax into the cannulated pedicle holes on the left.  At this point, I performed a thorough and complete L4-5 decompression, removing a substantial amount of ligamentum flavum hypertrophy as well as facet hypertrophy bilaterally.  In doing so, I was able to thoroughly and successfully decompress the right and left lateral recess.  I then turned my attention to the left side.  At this point, I did extend the partial facetectomy on the left.  With an assistant holding medial retraction of the left L5 nerve, I did use a 15-blade knife to perform an annulotomy. I then used a series of curettes and pituitary rongeurs in order to perform a thorough and complete intervertebral diskectomy.  The endplates were appropriately prepared.  The intervertebral space was then liberally packed with autograft from the decompression, as well as allograft in the form of ViviGen, as well as the intervertebral implant. The appropriate size intervertebral implant was then tamped into position in the usual fashion.  I was very pleased with the press-fit of the implant.  I then placed 6 mm screws of the appropriate  length into the L4 and L5 pedicles on the left.  The distraction was then released on the contralateral right side.  A 30 mm rod was placed on the left and caps were placed on the left as well.  All 4 caps were then tightened and a final locking procedure was performed.  I then placed autograft and allograft into the right lateral recess and right posterolateral gutter, in order to aid in the success of the fusion.  I was very pleased with the final AP and lateral fluoroscopic images.  The wound was then copiously irrigated.  A #15 deep Blake drain was placed deep to the fascia.  The fascia was then closed using #1 Vicryl.  The subcutaneous layer was then closed using 2-0 Vicryl, followed by 4-0 Monocryl.  Benzoin and Steri-Strips were applied, followed by sterile dressing.  All instrument counts were correct at the termination of the procedure.  Of note, Pricilla Holm was my assistant throughout surgery, and did aid in retraction, suctioning, and closure from start to finish.     Phylliss Bob, MD   ______________________________ Phylliss Bob, MD    MD/MEDQ  D:  10/29/2016  T:  10/30/2016  Job:  [id number]

## 2016-10-30 NOTE — Progress Notes (Signed)
    Patient doing well, has been up and walking hallways X2, reports resolved BIL leg pain, is very pleased and excited. He has expected well controlled PO LBP. NL Eat/Drink/B/B function.    Physical Exam: BP 122/85 (BP Location: Left Arm)   Pulse 98   Temp 98.5 F (36.9 C) (Oral)   Resp 20   SpO2 96%   Pt sitting up in chair comfortably with brace on, under arm straps had to be moved up to attach to back T bar otherwise fitting well Dressing in place NVI  Drain: 50 CC in 5 hours  POD #1 s/p L4/5 Decompression and Fusion with resolved Bilateral leg pain doing wonderful   - up with PT/OT, encourage ambulation - Percocet for pain, Valium for muscle spasms - likely d/c home today pending PT clearance - Monitor Drain output Q4hrs and record, may D/C drain at D/C today vs tomorrow am in clinic

## 2016-10-30 NOTE — Evaluation (Signed)
Physical Therapy Evaluation Patient Details Name: Tony Roberts MRN: [id number] DOB: [birth year] Today's Date: 10/30/2016   History of Present Illness  Pt is 76 y.o. male s/p L4-5 decompression and fusion  Clinical Impression  Pt presents with decreased functional mobility s/p L4-5 decompression and fusion. Pt motivated to participate with PT and demonstrates good understanding of brace application and precautions; all mobility, positioning, and brace precautions were reviewed with pt. Pt lives at home with his wife who will be available for supervision; PTA, pt indep with all amb and ADLs, but limited by back/leg pain. Pt would benefit from continued skilled acute PT services to maximize functional independence and safety for returning home.     Follow Up Recommendations No PT follow up    Equipment Recommendations  None recommended by PT    Recommendations for Other Services       Precautions / Restrictions Precautions Precautions: Back Precaution Comments: Back handout reviewed in detail.  Required Braces or Orthoses: Spinal Brace Spinal Brace: Lumbar corset;Applied in sitting position Restrictions Weight Bearing Restrictions: No      Mobility  Bed Mobility Overal bed mobility: Modified Independent;Needs Assistance Bed Mobility: Rolling;Sidelying to Sit Rolling: Supervision Sidelying to sit: Supervision       General bed mobility comments: VCs for logroll technique and precautions.   Transfers Overall transfer level: Needs assistance Equipment used: None Transfers: Sit to/from Stand Sit to Stand: Supervision            Ambulation/Gait Ambulation/Gait assistance: Supervision Ambulation Distance (Feet): 500 Feet Assistive device: None Gait Pattern/deviations: Step-through pattern;Decreased stride length Gait velocity: Slowed gait velocity   General Gait Details: One instance of stagerring to left, but pt able to self-correct with no LOB  Stairs Stairs:  Yes Stairs assistance: Supervision Stair Management: One rail Left;Alternating pattern Number of Stairs: 20    Wheelchair Mobility    Modified Rankin (Stroke Patients Only)       Balance Overall balance assessment: Needs assistance Sitting-balance support: Feet supported;No upper extremity supported Sitting balance-Leahy Scale: Good     Standing balance support: No upper extremity supported Standing balance-Leahy Scale: Good                               Pertinent Vitals/Pain Pain Assessment: 0-10 Pain Score: 3  Faces Pain Scale: Hurts a little bit Pain Location: Back Pain Descriptors / Indicators: Sore Pain Intervention(s): Monitored during session;Premedicated before session;Repositioned    Home Living Family/patient expects to be discharged to:: Private residence Living Arrangements: Spouse/significant other Available Help at Discharge: Family;Available 24 hours/day Type of Home: Apartment Home Access: Stairs to enter Entrance Stairs-Rails: Psychiatric nurse of Steps: 22 Home Layout: One level Home Equipment: None Additional Comments: Wife will be available to help at home    Prior Function Level of Independence: Independent               Hand Dominance   Dominant Hand: Right    Extremity/Trunk Assessment   Upper Extremity Assessment Upper Extremity Assessment: Defer to OT evaluation RUE Deficits / Details: rotator cuff surg pending    Lower Extremity Assessment Lower Extremity Assessment: Overall WFL for tasks assessed    Cervical / Trunk Assessment Cervical / Trunk Assessment: Other exceptions (s/p spinal surgery)  Communication   Communication: No difficulties  Cognition Arousal/Alertness: Awake/alert Behavior During Therapy: WFL for tasks assessed/performed Overall Cognitive Status: Within Functional Limits for tasks assessed  General Comments      Exercises      Assessment/Plan    PT Assessment Patient needs continued PT services  PT Problem List Decreased mobility;Decreased activity tolerance          PT Treatment Interventions Gait training;Functional mobility training;Therapeutic exercise    PT Goals (Current goals can be found in the Care Plan section)  Acute Rehab PT Goals Patient Stated Goal: Return home with wife PT Goal Formulation: With patient Time For Goal Achievement: 11/13/16 Potential to Achieve Goals: Good    Frequency Min 5X/week   Barriers to discharge        Co-evaluation               End of Session Equipment Utilized During Treatment: Back brace Activity Tolerance: Patient tolerated treatment well Patient left: in chair;with call bell/phone within reach Nurse Communication: Mobility status         Time: [id number] PT Time Calculation (min) (ACUTE ONLY): 13 min   Charges:   PT Evaluation $PT Eval Low Complexity: 1 Procedure     PT G Codes:       Enis Gash, SPT [phone]  Mabeline Caras 10/30/2016, 10:07 AM

## 2016-10-30 NOTE — Evaluation (Signed)
Occupational Therapy Evaluation Patient Details Name: Tony Roberts MRN: [id number] DOB: 1941-05-01 Today's Date: 10/30/2016    History of Present Illness 76 yo male s/p  L4/5 Decompression and Fusion             Clinical Impression  Past Medical History:  Diagnosis Date  . Arthritis   . Asthma    last asthma attack at age 71  . GERD (gastroesophageal reflux disease)   . Headache   . Other specified iron deficiency anemias   . PAC (premature atrial contraction)    occasional PAC's    Patient evaluated by Occupational Therapy with no further acute OT needs identified. All education has been completed and the patient has no further questions. See below for any follow-up Occupational Therapy or equipment needs. OT to sign off. Thank you for referral.      Follow Up Recommendations  No OT follow up    Equipment Recommendations  None recommended by OT    Recommendations for Other Services       Precautions / Restrictions Precautions Precautions: Back Precaution Comments: back handout provided and reviewed in detail Required Braces or Orthoses: Spinal Brace Spinal Brace: Lumbar corset;Applied in sitting position      Mobility Bed Mobility Overal bed mobility: Independent             General bed mobility comments: required incr time to due to R UE deficits  Transfers Overall transfer level: Modified independent                    Balance                                            ADL Overall ADL's : Modified independent                                       General ADL Comments: pt able to don brace independent and educated on leaving L side hooked. pt able to complete bed mobility basic tranfer and simulated shower transfer.    Back handout provided and reviewed adls in detail. Pt educated on: clothing between brace, never sleep in brace, set an alarm at night for medication, avoid sitting for long  periods of time, correct bed positioning for sleeping, correct sequence for bed mobility, avoiding lifting more than 5 pounds and never wash directly over incision. All education is complete and patient indicates understanding.    Vision Vision Assessment?: No apparent visual deficits   Perception     Praxis      Pertinent Vitals/Pain Pain Assessment: Faces Faces Pain Scale: Hurts a little bit Pain Location: back Pain Descriptors / Indicators: Sore Pain Intervention(s): Monitored during session;Premedicated before session;Repositioned     Hand Dominance Right   Extremity/Trunk Assessment Upper Extremity Assessment Upper Extremity Assessment: RUE deficits/detail RUE Deficits / Details: rotator cuff surg pending   Lower Extremity Assessment Lower Extremity Assessment: Defer to PT evaluation   Cervical / Trunk Assessment Cervical / Trunk Assessment: Other exceptions (s/p surg)   Communication Communication Communication: No difficulties   Cognition Arousal/Alertness: Awake/alert Behavior During Therapy: WFL for tasks assessed/performed Overall Cognitive Status: Within Functional Limits for tasks assessed  General Comments       Exercises       Shoulder Instructions      Home Living Family/patient expects to be discharged to:: Private residence Living Arrangements: Spouse/significant other Available Help at Discharge: Family;Available 24 hours/day Type of Home: Apartment Home Access: Stairs to enter Entrance Stairs-Number of Steps: 22 Entrance Stairs-Rails: Right;Left Home Layout: One level     Bathroom Shower/Tub: Occupational psychologist: Standard     Home Equipment: None   Additional Comments: wife will be on FMLA to help      Prior Functioning/Environment Level of Independence: Independent                 OT Problem List:     OT Treatment/Interventions:      OT Goals(Current goals can be found in the  care plan section) Acute Rehab OT Goals Potential to Achieve Goals: Good  OT Frequency:     Barriers to D/C:            Co-evaluation              End of Session Equipment Utilized During Treatment: Back brace Nurse Communication: Mobility status;Precautions  Activity Tolerance: Patient tolerated treatment well Patient left: in chair;with call bell/phone within reach   Time: 0730-0741 OT Time Calculation (min): 11 min Charges:  OT General Charges $OT Visit: 1 Procedure OT Evaluation $OT Eval Moderate Complexity: 1 Procedure G-Codes:    Parke Poisson B 11/17/16, 8:34 AM  Jeri Modena   OTR/L [phone] Office: [phone] .

## 2016-10-30 NOTE — Progress Notes (Signed)
    Just spoke with floor nurse, pts drain has put out 35cc since 6am, OK to D/C drain, pt has cleared PT and is proceeding home, drain will be clipped and removed at D/C.   Pricilla Holm, PA-C

## 2016-10-30 NOTE — Progress Notes (Signed)
Pt doing well. Pt and wife given D/C instructions with Rx's, verbal understanding was provided. Pt's incision is clean and dry with no sign of infection. Pt's IV and JP drain were removed prior to D/C. Pt D/C'd home via wheelchair @ 1505 per MD order. Pt is stable @ D/C and has no other needs at this time. Holli Humbles, RN

## 2016-11-02 ENCOUNTER — Encounter: Payer: Self-pay | Admitting: *Deleted

## 2016-11-02 ENCOUNTER — Other Ambulatory Visit: Payer: Self-pay | Admitting: *Deleted

## 2016-11-02 MED FILL — AMLODIPINE BESYLATE 5 MG TA: 5 | 30 days supply | Qty: 30 | Fill #0

## 2016-11-02 NOTE — Patient Outreach (Signed)
[facility]) Care Management  11/02/2016  Tony Roberts 09-Apr-1941 [id number]   Subjective: Telephone call to patient's home number, spoke with patient, and HIPAA verified.   Discussed [facility] Care Management [facility] Transition of care follow up, patient voiced understanding, and is in agreement to complete follow up. Patient states he is doing well, getting better everyday, surgical pain being managed with pain medications, weaning pain medications as needed, has nothing but praise for hospital care team, and has a follow up appointment scheduled with surgeon on 11/11/16.   States he is walking, getting stronger everyday and very pleased with his progress.  States he received a call from his primary MD's office today, received an update, and no appointment needed at this time.   States his long term goal is to return to playing travel bowling within a few months.   Patient states he does not have any transition of care, care coordination, disease management, disease monitoring, transportation, community resource, or pharmacy needs at this time.  States he is very appreciative of the follow up calls and is in agreement to receive [facility] Management information.  Outpatient Encounter Prescriptions as of 11/02/2016  Medication Sig  . albuterol (PROVENTIL HFA) 108 (90 Base) MCG/ACT inhaler Inhale two puffs every four to six hours as needed for cough or wheeze.  Marland Kitchen amLODipine (NORVASC) 5 MG tablet Take 5 mg by mouth every other day.   . esomeprazole (NEXIUM) 40 MG capsule TAKE 1 CAPSULE BY MOUTH ONCE DAILY FOR REFLUX  . Fluticasone-Salmeterol (ADVAIR) 250-50 MCG/DOSE AEPB Use one inhalation one to two times a day to prevent cough or wheeze.  Rinse, gargle, and spit after use. (Patient taking differently: Inhale 1 puff into the lungs daily. Rinse, gargle, and spit after use.)  . montelukast (SINGULAIR) 10 MG tablet Take 1 tablet (10 mg total) by mouth at bedtime.  Marland Kitchen oxymetazoline (AFRIN) 0.05 %  nasal spray Place 1 spray into both nostrils daily as needed for congestion.   No facility-administered encounter medications on file as of 11/02/2016.     Objective: Per chart review, patient hospitalized  10/29/16 - 10/30/16 for L4-5 spinal stenosis.  Status post Lumbar decompression, L4-5, including substantial bone and soft  tissue removal involving the right and left lateral recess.  2. Left-sided L4-5 transforaminal lumbar interbody fusion. 3. Right-sided L4-5 posterolateral fusion.  4. Insertion of interbody device x1 (Concorde bullet cage, 11 x 27 mm, lordotic).  5. Placement of posterior instrumentation, L4, L5.  6. Use of local autograft. 7. Use of morselized allograft.  8. Intraoperative use of fluoroscopy on 10/29/16.   Patient also has a history of asthma and hypertension.    Assessment: Received [facility] Transition of care referral on 10/30/16.   Transition of care follow up completed, no care management needs, and will proceed with case closure.   Plan: RNCM will send successful outreach letter, [facility] pamphlet, and magnet. RNCM will send case closure due to follow up completed / no care management needs request to [facility].   Kyandra Mcclaine H. Annia Friendly, BSN, [place] Management [facility] Telephonic CM Phone: [phone] Fax: [phone]

## 2016-11-11 DIAGNOSIS — Z9889 Other specified postprocedural states: Secondary | ICD-10-CM | POA: Diagnosis not present

## 2016-11-24 NOTE — Discharge Summary (Signed)
Patient ID: Tony Roberts MRN: [id number] DOB/AGE: [birth year] 76 y.o.  Admit date: 10/29/2016 Discharge date: 10/30/2016  Admission Diagnoses:  Active Problems:   Radiculopathy   Discharge Diagnoses:  Same  Past Medical History:  Diagnosis Date  . Arthritis   . Asthma    last asthma attack at age 26  . GERD (gastroesophageal reflux disease)   . Headache   . Other specified iron deficiency anemias   . PAC (premature atrial contraction)    occasional PAC's    Surgeries: Procedure(s): LEFT SIDED LUMBAR 4-5 TRANSFORAMINAL LUMBAR INTERBODY FUSION WITH INSTRUMENTATION AND ALLOGRAFT on 10/29/2016   Consultants: None  Discharged Condition: Improved  Hospital Course: Tony Roberts is an 76 y.o. male who was admitted 10/29/2016 for operative treatment of spinal stenosis. Patient has severe unremitting pain that affects sleep, daily activities, and work/hobbies. After pre-op clearance the patient was taken to the operating room on 10/29/2016 and underwent  Procedure(s): LEFT SIDED LUMBAR 4-5 TRANSFORAMINAL LUMBAR INTERBODY FUSION WITH INSTRUMENTATION AND ALLOGRAFT.    Patient was given perioperative antibiotics:  Anti-infectives    Start     Dose/Rate Route Frequency Ordered Stop   10/29/16 2000  ceFAZolin (ANCEF) IVPB 2g/100 mL premix     2 g 200 mL/hr over 30 Minutes Intravenous Every 8 hours 10/29/16 1743 10/30/16 0430   10/29/16 1000  ceFAZolin (ANCEF) IVPB 2g/100 mL premix     2 g 200 mL/hr over 30 Minutes Intravenous To Short Stay 10/29/16 0956 10/29/16 1141   10/29/16 0958  ceFAZolin (ANCEF) 2-4 GM/100ML-% IVPB    Comments:  [name]   : cabinet override      10/29/16 0958 10/29/16 1141       Patient was given sequential compression devices, early ambulation to prevent DVT.  Patient benefited maximally from hospital stay and there were no complications.    Recent vital signs: BP 122/85 (BP Location: Left Arm)   Pulse 98   Temp 98.5 F (36.9 C) (Oral)    Resp 20   SpO2 96%   Discharge Medications:   Allergies as of 10/30/2016      Reactions   No Known Allergies       Medication List    STOP taking these medications   acetaminophen 500 MG tablet Commonly known as:  TYLENOL     TAKE these medications   albuterol 108 (90 Base) MCG/ACT inhaler Commonly known as:  PROVENTIL HFA Inhale two puffs every four to six hours as needed for cough or wheeze.   amLODipine 5 MG tablet Commonly known as:  NORVASC Take 5 mg by mouth every other day.   esomeprazole 40 MG capsule Commonly known as:  NEXIUM TAKE 1 CAPSULE BY MOUTH ONCE DAILY FOR REFLUX   Fluticasone-Salmeterol 250-50 MCG/DOSE Aepb Commonly known as:  ADVAIR Use one inhalation one to two times a day to prevent cough or wheeze.  Rinse, gargle, and spit after use. What changed:  how much to take  how to take this  when to take this  additional instructions   montelukast 10 MG tablet Commonly known as:  SINGULAIR Take 1 tablet (10 mg total) by mouth at bedtime.   oxymetazoline 0.05 % nasal spray Commonly known as:  AFRIN Place 1 spray into both nostrils daily as needed for congestion.       Diagnostic Studies: Dg Lumbar Spine 2-3 Views  Result Date: 10/29/2016 CLINICAL DATA:  Posterior fusion at L4-5 EXAM: LUMBAR SPINE - 2-3 VIEW  COMPARISON:  Intraoperative lateral lumbar spine films of 10/29/2016 FINDINGS: Two C-arm spot films show transpedicular screw fixation posteriorly for fusion of L4-5. The interbody fusion plug at L4-5 appears to be in good position with normal alignment maintained. IMPRESSION: Posterior fusion at L4-5. Electronically Signed   By: Ivar Drape M.D.   On: 10/29/2016 15:23   Dg Lumbar Spine 1 View  Result Date: 10/29/2016 CLINICAL DATA:  L4-5 lumbar fusion EXAM: LUMBAR SPINE - 1 VIEW COMPARISON:  Lumbar spine films of 06/16/2016 FINDINGS: Cross-table lateral view labeled 1 shows a needle positioned posteriorly directed toward the spinous process of  L1 with a second more caudal needle directed toward the spinous process of L3 for localization. IMPRESSION: Needles posteriorly positioned directed toward the spinous processes of L1 and L3 respectively. Electronically Signed   By: Ivar Drape M.D.   On: 10/29/2016 15:20   Dg C-arm 1-60 Min  Result Date: 10/29/2016 CLINICAL DATA:  L4-5 surgery EXAM: DG C-ARM 61-120 MIN FLUOROSCOPY TIME:  47 seconds Images: 2 COMPARISON:  None. FINDINGS: Pedicle rods and screws have been inserted at L4 and L5. A disc spacer device is seen at the same level. IMPRESSION: Pedicle rod and screw placement at L4-5 as above. Electronically Signed   By: Dorise Bullion III M.D   On: 10/29/2016 15:25    Disposition: 01-Home or Self Care  Discharge Instructions    AMB Referral to [facility] Management    Complete by:  As directed    Please assign [employer] member for post discharge call. Discharging today (10/30/16/) from [facility]. Please call with questions. Thanks. Marthenia Rolling, MSN-Ed, RN,BSN [facility] [phone]   Reason for consult:  Please assign UMR member for post discharge call   Expected date of contact:  1-3 days (reserved for hospital discharges)   Discharge patient    Complete by:  As directed    Pending PT/OT clearance  Monitor Drain output Q4hrs and record, may D/C drain at D/C vs tomorrow am in clinic   Discharge disposition:  01-Home or Self Care   Discharge patient date:  10/30/2016     POD #1 s/p L4/5 Decompression and Fusion with resolved Bilateral leg pain doing wonderful   - up with PT/OT, encourage ambulation - Percocet for pain, Valium for muscle spasms - Drain d/c'd - Written scripts for pain signed and in chart - D/C instructions sheet printed and in chart - D/C today  - F/U in office 2 weeks   Signed: Justice Britain 11/24/2016, 5:03 PM

## 2016-12-08 DIAGNOSIS — M48062 Spinal stenosis, lumbar region with neurogenic claudication: Secondary | ICD-10-CM | POA: Diagnosis not present

## 2016-12-14 ENCOUNTER — Telehealth: Payer: Self-pay | Admitting: Allergy and Immunology

## 2016-12-14 NOTE — Telephone Encounter (Signed)
Patient has been taking ADVAIR for year with no issues However this year the price has almost tripled and the patient cannot afford it  Is there a generic that the patient can use?? Can the patient be switched to another medication that is more affordable?? Please call patient to answer any questions

## 2016-12-14 NOTE — Telephone Encounter (Signed)
Please advise on an alternative for Advair?

## 2016-12-14 NOTE — Telephone Encounter (Signed)
What alternatives and prices concerning insurance coverage for a substitute for Advair

## 2016-12-15 MED ORDER — FLUTICASONE FUROATE-VILANTEROL 200-25 MCG/INH IN AEPB: 1.0000 | INHALATION_SPRAY | Freq: Every day | RESPIRATORY_TRACT | 5 refills | Status: DC

## 2016-12-15 NOTE — Telephone Encounter (Signed)
Try Breo 200 one inhalation one time per day

## 2016-12-15 NOTE — Telephone Encounter (Signed)
We can do Breo, Symbicort, or Dulera and give coupon. Then there would be no co-pay for patient, per pharmacy.

## 2016-12-15 NOTE — Telephone Encounter (Signed)
Patient states that he has tried/failed Symbicort previously with severe leg cramps. He denied any issues with Breo or Dulera. Please advise?

## 2016-12-15 NOTE — Telephone Encounter (Signed)
Please provide sample and script for Symbicort 160 two inhalations two times per day. Can give coupon but not sure it will work with [facility].

## 2016-12-15 NOTE — Telephone Encounter (Signed)
Patient informed and advised on prescription change. I have placed 2 samples up front, will search for discount card online.

## 2016-12-22 DIAGNOSIS — Z760 Encounter for issue of repeat prescription: Secondary | ICD-10-CM | POA: Diagnosis not present

## 2016-12-29 ENCOUNTER — Ambulatory Visit (INDEPENDENT_AMBULATORY_CARE_PROVIDER_SITE_OTHER): Payer: 59 | Admitting: Allergy and Immunology

## 2016-12-29 ENCOUNTER — Encounter: Payer: Self-pay | Admitting: Allergy and Immunology

## 2016-12-29 VITALS — BP 124/80 | HR 64 | Resp 18

## 2016-12-29 DIAGNOSIS — J4540 Moderate persistent asthma, uncomplicated: Secondary | ICD-10-CM

## 2016-12-29 DIAGNOSIS — K219 Gastro-esophageal reflux disease without esophagitis: Secondary | ICD-10-CM

## 2016-12-29 DIAGNOSIS — J3089 Other allergic rhinitis: Secondary | ICD-10-CM

## 2016-12-29 MED ORDER — METHYLPREDNISOLONE ACETATE 80 MG/ML IJ SUSP
80.0000 mg | Freq: Once | INTRAMUSCULAR | Status: AC
Start: 2016-12-29 — End: 2016-12-29
  Administered 2016-12-29: 80 mg via INTRAMUSCULAR

## 2016-12-29 MED ORDER — RANITIDINE HCL 300 MG PO TABS: ORAL_TABLET | ORAL | 1 refills | Status: DC

## 2016-12-29 MED FILL — raNITIdine HCL 300 MG TABS: 300 | 90 days supply | Qty: 90 | Fill #0

## 2016-12-29 NOTE — Progress Notes (Signed)
Follow-up Note  Referring Provider: Alroy Roberts, L.Tony Sa, MD Primary Provider: Donnie Coffin, MD Date of Office Visit: 12/29/2016  Subjective:   Tony Roberts (DOB: 02/26/1941) is a 76 y.o. male who returns to the [facility] [place] [facility] on 12/29/2016 in re-evaluation of the following:  HPI: Tony Roberts presents to this clinic in reevaluation of his asthma and allergic rhinitis and history of LPR. I last saw him in this clinic in August 2017.  Over the course of the past 3 months he has slowly developed issues with congested head and clear rhinorrhea without any anosmia or ugly nasal discharge or headaches but lots of postnasal drip and throat clearing and some slight cough. He has not had any shortness of breath or chest tightness or sputum production. There has not really been an obvious provoking factor giving rise to this issue. He had to change his Advair to [facility] because of an insurance issue and also had to change his Nexium to a generic form. He is now using Afrin about 3 times a week.  Prior to this point in time he did wonderful and did not require systemic steroid or an antibiotic for his respiratory tract issue. He rarely uses a short-acting bronchodilator.  He did not exercise very much since August 2017 because he had a disc issue develop in his lower back subsequently leading to spinal surgery with a fusion in February 2018 with a very good result.  Allergies as of 12/29/2016      Reactions   No Known Allergies       Medication List      albuterol 108 (90 Base) MCG/ACT inhaler Commonly known as:  PROVENTIL HFA Inhale two puffs every four to six hours as needed for cough or wheeze.   amLODipine 5 MG tablet Commonly known as:  NORVASC Take 5 mg by mouth every other day.   esomeprazole 40 MG capsule Commonly known as:  NEXIUM TAKE 1 CAPSULE BY MOUTH ONCE DAILY FOR REFLUX   fluticasone furoate-vilanterol 200-25 MCG/INH Aepb Commonly known as:  BREO ELLIPTA Inhale 1  puff into the lungs daily.   montelukast 10 MG tablet Commonly known as:  SINGULAIR Take 1 tablet (10 mg total) by mouth at bedtime.   oxymetazoline 0.05 % nasal spray Commonly known as:  AFRIN Place 1 spray into both nostrils daily as needed for congestion.       Past Medical History:  Diagnosis Date  . Arthritis   . Asthma    last asthma attack at age 55  . GERD (gastroesophageal reflux disease)   . Headache   . Other specified iron deficiency anemias   . PAC (premature atrial contraction)    occasional PAC's    Past Surgical History:  Procedure Laterality Date  . CATARACT EXTRACTION    . HERNIA REPAIR    . SPINE SURGERY  10/29/2016  . TONSILLECTOMY      Review of systems negative except as noted in HPI / PMHx or noted below:  Review of Systems  Constitutional: Negative.   HENT: Negative.   Eyes: Negative.   Respiratory: Negative.   Cardiovascular: Negative.   Gastrointestinal: Negative.   Genitourinary: Negative.   Musculoskeletal: Negative.   Skin: Negative.   Neurological: Negative.   Endo/Heme/Allergies: Negative.   Psychiatric/Behavioral: Negative.      Objective:   Vitals:   12/29/16 0834  BP: 124/80  Pulse: 64  Resp: 18          Physical Exam  Constitutional: He is well-developed, well-nourished, and in no distress.  Slightly nasal voice  HENT:  Head: Normocephalic.  Right Ear: Tympanic membrane, external ear and ear canal normal.  Left Ear: Tympanic membrane, external ear and ear canal normal.  Nose: Mucosal edema present. No rhinorrhea.  Mouth/Throat: Uvula is midline, oropharynx is clear and moist and mucous membranes are normal. No oropharyngeal exudate.  Eyes: Conjunctivae are normal.  Neck: Trachea normal. No tracheal tenderness present. No tracheal deviation present. No thyromegaly present.  Cardiovascular: Normal rate, regular rhythm, S1 normal, S2 normal and normal heart sounds.   No murmur heard. Pulmonary/Chest: Breath  sounds normal. No stridor. No respiratory distress. He has no wheezes. He has no rales.  Musculoskeletal: He exhibits no edema.  Lymphadenopathy:       Head (right side): No tonsillar adenopathy present.       Head (left side): No tonsillar adenopathy present.    He has no cervical adenopathy.  Neurological: He is alert. Gait normal.  Skin: No rash noted. He is not diaphoretic. No erythema. Nails show no clubbing.  Psychiatric: Mood and affect normal.    Diagnostics:    Spirometry was performed and demonstrated an FEV1 of 2.59 at 100 % of predicted.  Assessment and Plan:   1. Asthma, moderate persistent, well-controlled   2. Other allergic rhinitis   3. LPRD (laryngopharyngeal reflux disease)     1. Continue Breo 200 one inhalation one time per day  2. Continue Singulair 10 mg daily and Pro-Air HFA if needed  3. Treat reflux:   A. Continue generic nexium 40mg  in AM  B. Start Ranitidine 300mg  in PM  4. Start OTC Nasacort one spray each nostril two times per day  5. Depo-Medrol 80 IM delivered in clinic today  6. Nasal saline, Mucinex DM, ibuprofen, Claritin if needed  7. Try to eliminate use of Afrin  8. Further treatment?  9. Return to clinic in 4 weeks or earlier if problem  I will have Kym utilize a combination of anti-inflammatory medications for both his upper and lower airways noted above and also have him get a little bit more aggressive about treating his reflux. I've asked him to eliminate use of his Afrin so he hopefully does not roll over into a component of rhinitis medicamentosa. I'll see him back in this clinic in 4 weeks to make sure that he is going down the road to improvement and will not require any further evaluation or treatment. If he still continues to have significant respiratory tract symptoms we may need to image his airway.  [name], MD Allergy / Immunology [place]

## 2016-12-29 NOTE — Patient Instructions (Signed)
  1. Continue Breo 200 0ne inhalation one time per day  2. Continue Singulair 10 mg daily and Pro-Air HFA if needed  3. Treat reflux:   A. Continue generic nexium 40mg  in AM  B. Start Ranitidine 300mg  in PM  4. Start OTC Nasacort one spray each nostril two times per day  5. Depo-Medrol 80 IM delivered in clinic today  6. Nasal saline, Mucinex DM, ibuprofen, Claritin if needed  7. Try to eliminate use of Afrin  8. Further treatment?  9. Return to clinic in 4 weeks or earlier if problem

## 2016-12-31 MED FILL — AMLODIPINE BESYLATE 5 MG TA: 5 | 30 days supply | Qty: 30 | Fill #1

## 2016-12-31 MED FILL — ESOMEPRAZOLE MAG DR 40 MG C: 40 | 90 days supply | Qty: 90 | Fill #3

## 2017-01-06 DIAGNOSIS — E7800 Pure hypercholesterolemia, unspecified: Secondary | ICD-10-CM | POA: Diagnosis not present

## 2017-01-06 DIAGNOSIS — K219 Gastro-esophageal reflux disease without esophagitis: Secondary | ICD-10-CM | POA: Diagnosis not present

## 2017-01-06 DIAGNOSIS — J45909 Unspecified asthma, uncomplicated: Secondary | ICD-10-CM | POA: Diagnosis not present

## 2017-01-06 DIAGNOSIS — I10 Essential (primary) hypertension: Secondary | ICD-10-CM | POA: Diagnosis not present

## 2017-01-11 MED FILL — MONTELUKAST SOD 10 MG TAB: 10 | 90 days supply | Qty: 90 | Fill #3

## 2017-01-12 ENCOUNTER — Telehealth: Payer: Self-pay

## 2017-01-12 NOTE — Telephone Encounter (Signed)
We can try Dulera 200 - 2 inhalations twice a day and we can try AirDuo 232 inhalations twice a day. Does his insurance cover these medications?

## 2017-01-12 NOTE — Telephone Encounter (Signed)
Patient has been experiencing leg cramps at night since being on the [facility].  He stopped the [facility] and leg cramps ceased a few days later.  Symbicort also gives him leg cramps. Patient has been on Advair in the past; it is expensive.  Is there something else patient can take? Generic Advair? Please advise.

## 2017-01-12 NOTE — Telephone Encounter (Signed)
Left message for patient to call office.  

## 2017-01-14 MED ORDER — MOMETASONE FURO-FORMOTEROL FUM 200-5 MCG/ACT IN AERO: 2.0000 | INHALATION_SPRAY | Freq: Two times a day (BID) | RESPIRATORY_TRACT | 5 refills | Status: DC

## 2017-01-14 MED FILL — ADVAIR 250/50 DISKUS: 250-50 | 30 days supply | Qty: 60 | Fill #1

## 2017-01-14 NOTE — Telephone Encounter (Signed)
Informed patient of switch to [facility]. And to let us know if the leg cramps continue.

## 2017-01-14 NOTE — Addendum Note (Signed)
Addended by: Angelica Ran on: 01/14/2017 08:38 AM   Modules accepted: Orders

## 2017-01-19 DIAGNOSIS — M48062 Spinal stenosis, lumbar region with neurogenic claudication: Secondary | ICD-10-CM | POA: Diagnosis not present

## 2017-01-21 DIAGNOSIS — M545 Low back pain: Secondary | ICD-10-CM | POA: Diagnosis not present

## 2017-01-21 DIAGNOSIS — M4326 Fusion of spine, lumbar region: Secondary | ICD-10-CM | POA: Diagnosis not present

## 2017-02-02 ENCOUNTER — Encounter (INDEPENDENT_AMBULATORY_CARE_PROVIDER_SITE_OTHER): Payer: Self-pay

## 2017-02-02 ENCOUNTER — Ambulatory Visit (INDEPENDENT_AMBULATORY_CARE_PROVIDER_SITE_OTHER): Payer: 59 | Admitting: Allergy and Immunology

## 2017-02-02 ENCOUNTER — Encounter: Payer: Self-pay | Admitting: Allergy and Immunology

## 2017-02-02 VITALS — BP 128/72 | HR 80 | Resp 16

## 2017-02-02 DIAGNOSIS — K219 Gastro-esophageal reflux disease without esophagitis: Secondary | ICD-10-CM

## 2017-02-02 DIAGNOSIS — R05 Cough: Secondary | ICD-10-CM | POA: Diagnosis not present

## 2017-02-02 DIAGNOSIS — J3089 Other allergic rhinitis: Secondary | ICD-10-CM

## 2017-02-02 DIAGNOSIS — J4540 Moderate persistent asthma, uncomplicated: Secondary | ICD-10-CM | POA: Diagnosis not present

## 2017-02-02 DIAGNOSIS — R059 Cough, unspecified: Secondary | ICD-10-CM

## 2017-02-02 MED ORDER — MUPIROCIN 2 % EX OINT: TOPICAL_OINTMENT | CUTANEOUS | 0 refills | Status: DC

## 2017-02-02 MED FILL — MUPIROCIN 2% OINTMENT: 2 | 10 days supply | Qty: 22 | Fill #0

## 2017-02-02 MED FILL — AMLODIPINE BESYLATE 5 MG TA: 5 | 90 days supply | Qty: 90 | Fill #0

## 2017-02-02 NOTE — Patient Instructions (Addendum)
  1. Continue Advair 250 one inhalation one - two times per day depending on disease activity  2. Continue Singulair 10 mg daily and Pro-Air HFA if needed  3. Treat reflux:   A. Continue generic nexium 40mg  in AM  B. Continue Ranitidine 300mg  in PM  4. Continue OTC Nasacort one spray each nostril two times per day. Does not spray on immediate opening of nose.   5. Nasal saline, Mucinex DM, ibuprofen, Claritin if needed  6. Use Bactroban ointment applied immediate opening of nose 3 times a day for 10 days  7. Obtain a chest x-ray  8. Evaluation with ENT to look at throat and sinuses  9. Return to clinic in 12 weeks or earlier if problem

## 2017-02-02 NOTE — Progress Notes (Signed)
Follow-up Note  Referring Provider: Alroy Dust, L.Marlou Sa, MD Primary Provider: Alroy Dust, Carlean Jews.Marlou Sa, MD Date of Office Visit: 02/02/2017  Subjective:   Tony Roberts (DOB: [birth year]) is a 76 y.o. male who returns to the [facility] [place] on 02/02/2017 in re-evaluation of the following:  HPI: Tony Roberts presents to this clinic in reevaluation of his asthma and allergic rhinitis and LPR. His last visit to this clinic was 29 December 2016 at which point in time he was having significant issues with cough and throat clearing and postnasal drip and some head congestion for which we made a plan to address each issue.  His nose is doing very well at this point. He is not using any Afrin and is using Nasonex on a consistent basis. He has very slight nasal congestion but no other upper airway symptoms.  Likewise, his chest is doing very well and he has no cough. He rarely uses a short acting bronchodilator at this point in time. Because of an insurance issue he is now using Advair instead of Breo.  He still has "congestion" mostly in his throat. He does not have any pain or difficulty swallowing or change in voice.  He still continues to have some issues with cramping of his legs at nighttime. He is tried magnesium in the past which has been ineffective in alleviating his symptoms. He did have spinal surgery with fusion in February 2018 that had a pretty good result but he still has some left lower extremity radiculopathy  Allergies as of 02/02/2017      Reactions   No Known Allergies       Medication List      ADVAIR DISKUS 250-50 MCG/DOSE Aepb Generic drug:  Fluticasone-Salmeterol   albuterol 108 (90 Base) MCG/ACT inhaler Commonly known as:  PROVENTIL HFA Inhale two puffs every four to six hours as needed for cough or wheeze.   amLODipine 5 MG tablet Commonly known as:  NORVASC Take 5 mg by mouth every other day.   esomeprazole 40 MG capsule Commonly known as:  NEXIUM TAKE 1  CAPSULE BY MOUTH ONCE DAILY FOR REFLUX   montelukast 10 MG tablet Commonly known as:  SINGULAIR Take 1 tablet (10 mg total) by mouth at bedtime.   NASACORT ALLERGY 24HR 55 MCG/ACT Aero nasal inhaler Generic drug:  triamcinolone Place 1 spray into the nose daily.   ranitidine 300 MG tablet Commonly known as:  ZANTAC Take one tablet each evening       Past Medical History:  Diagnosis Date  . Arthritis   . Asthma    last asthma attack at age 12  . GERD (gastroesophageal reflux disease)   . Headache   . Other specified iron deficiency anemias   . PAC (premature atrial contraction)    occasional PAC's    Past Surgical History:  Procedure Laterality Date  . CATARACT EXTRACTION    . HERNIA REPAIR    . SPINE SURGERY  10/29/2016  . TONSILLECTOMY      Review of systems negative except as noted in HPI / PMHx or noted below:  Review of Systems  Constitutional: Negative.   HENT: Negative.   Eyes: Negative.   Respiratory: Negative.   Cardiovascular: Negative.   Gastrointestinal: Negative.   Genitourinary: Negative.   Musculoskeletal: Negative.   Skin: Negative.   Neurological: Negative.   Endo/Heme/Allergies: Negative.   Psychiatric/Behavioral: Negative.      Objective:   Vitals:   02/02/17 1017  BP: 128/72  Pulse: 80  Resp: 16          Physical Exam  Constitutional: He is well-developed, well-nourished, and in no distress.  HENT:  Head: Normocephalic.  Right Ear: Tympanic membrane, external ear and ear canal normal.  Left Ear: Tympanic membrane, external ear and ear canal normal.  Nose: Nose normal. No mucosal edema or rhinorrhea.  Mouth/Throat: Uvula is midline, oropharynx is clear and moist and mucous membranes are normal. No oropharyngeal exudate.  Eyes: Conjunctivae are normal.  Neck: Trachea normal. No tracheal tenderness present. No tracheal deviation present. No thyromegaly present.  Cardiovascular: Normal rate, regular rhythm, S1 normal, S2  normal and normal heart sounds.   No murmur heard. Pulmonary/Chest: Breath sounds normal. No stridor. No respiratory distress. He has no wheezes. He has no rales.  Musculoskeletal: He exhibits no edema.  Lymphadenopathy:       Head (right side): No tonsillar adenopathy present.       Head (left side): No tonsillar adenopathy present.    He has no cervical adenopathy.  Neurological: He is alert. Gait normal.  Skin: No rash noted. He is not diaphoretic. No erythema. Nails show no clubbing.  Psychiatric: Mood and affect normal.    Diagnostics:    Spirometry was performed and demonstrated an FEV1 of 2.49 at 101 % of predicted.  Assessment and Plan:   1. Asthma, moderate persistent, well-controlled   2. Other allergic rhinitis   3. LPRD (laryngopharyngeal reflux disease)   4. Cough     1. Continue Advair 250 one inhalation one - two times per day depending on disease activity  2. Continue Singulair 10 mg daily and Pro-Air HFA if needed  3. Treat reflux:   A. Continue generic nexium 40mg  in AM  B. Continue Ranitidine 300mg  in PM  4. Continue OTC Nasacort one spray each nostril two times per day. Does not spray on immediate opening of nose.   5. Nasal saline, Mucinex DM, ibuprofen, Claritin if needed  6. Use Bactroban ointment applied immediate opening of nose 3 times a day for 10 days  7. Obtain a chest x-ray  8. Evaluation with ENT to look at throat and sinuses  9. Return to clinic in 12 weeks or earlier if problem  Tony Roberts just did not bounce back from his recent problem that developed the spring regarding his respiratory tract and I'm going to have him be evaluated by an ENT doctor to take a look at his throat and we'll obtain a chest x-ray as well. He will continue to use anti-inflammatory medications and treatment directed against reflux as noted above. I will see him back in this clinic in 12 weeks or earlier if there is a problem.  [name], MD Allergy /  Immunology [place]

## 2017-02-10 DIAGNOSIS — R197 Diarrhea, unspecified: Secondary | ICD-10-CM | POA: Diagnosis not present

## 2017-02-10 DIAGNOSIS — K219 Gastro-esophageal reflux disease without esophagitis: Secondary | ICD-10-CM | POA: Diagnosis not present

## 2017-02-10 MED FILL — OSCIMIN 0.125 MG TABLET: 0.125 | 8 days supply | Qty: 30 | Fill #0

## 2017-02-20 NOTE — Addendum Note (Signed)
Addendum  created 02/20/17 0924 by Duane Boston, MD   Sign clinical note

## 2017-03-17 DIAGNOSIS — M545 Low back pain: Secondary | ICD-10-CM | POA: Diagnosis not present

## 2017-03-17 DIAGNOSIS — M4326 Fusion of spine, lumbar region: Secondary | ICD-10-CM | POA: Diagnosis not present

## 2017-03-19 DIAGNOSIS — M5416 Radiculopathy, lumbar region: Secondary | ICD-10-CM | POA: Diagnosis not present

## 2017-03-19 MED FILL — HYDROCODON-APAP 10-325: 10-325 | 5 days supply | Qty: 40 | Fill #0

## 2017-03-19 MED FILL — METHYLPREDNISOLONE 4 MG TAB: 4 | 6 days supply | Qty: 21 | Fill #0

## 2017-03-26 DIAGNOSIS — M545 Low back pain: Secondary | ICD-10-CM | POA: Diagnosis not present

## 2017-03-30 ENCOUNTER — Other Ambulatory Visit: Payer: Self-pay | Admitting: Allergy and Immunology

## 2017-03-30 DIAGNOSIS — M5416 Radiculopathy, lumbar region: Secondary | ICD-10-CM | POA: Diagnosis not present

## 2017-03-30 DIAGNOSIS — K219 Gastro-esophageal reflux disease without esophagitis: Secondary | ICD-10-CM

## 2017-03-30 MED FILL — ESOMEPRAZOLE MAG DR 40 MG C: 40 | 90 days supply | Qty: 90 | Fill #0

## 2017-03-30 MED FILL — raNITIdine HCL 300 MG TABS: 300 | 90 days supply | Qty: 90 | Fill #1

## 2017-04-02 DIAGNOSIS — H1132 Conjunctival hemorrhage, left eye: Secondary | ICD-10-CM | POA: Diagnosis not present

## 2017-04-13 ENCOUNTER — Other Ambulatory Visit: Payer: Self-pay

## 2017-04-13 DIAGNOSIS — J4540 Moderate persistent asthma, uncomplicated: Secondary | ICD-10-CM

## 2017-04-13 DIAGNOSIS — M5416 Radiculopathy, lumbar region: Secondary | ICD-10-CM | POA: Diagnosis not present

## 2017-04-13 MED ORDER — MONTELUKAST SODIUM 10 MG PO TABS: 10.0000 mg | ORAL_TABLET | Freq: Every day | ORAL | 3 refills | Status: DC

## 2017-04-13 MED FILL — MONTELUKAST SOD 10 MG TAB: 10 | 90 days supply | Qty: 90 | Fill #0

## 2017-04-15 ENCOUNTER — Other Ambulatory Visit: Payer: Self-pay | Admitting: Allergy and Immunology

## 2017-04-15 DIAGNOSIS — J4540 Moderate persistent asthma, uncomplicated: Secondary | ICD-10-CM

## 2017-04-15 MED FILL — ADVAIR 250/50 DISKUS: 250-50 | 90 days supply | Qty: 180 | Fill #0

## 2017-04-27 DIAGNOSIS — M5416 Radiculopathy, lumbar region: Secondary | ICD-10-CM | POA: Diagnosis not present

## 2017-04-27 MED FILL — GABAPENTIN 300 MG CAPS: 300 | 30 days supply | Qty: 90 | Fill #0

## 2017-04-28 DIAGNOSIS — M25511 Pain in right shoulder: Secondary | ICD-10-CM | POA: Diagnosis not present

## 2017-05-05 MED FILL — AMLODIPINE BESYLATE 5 MG TA: 5 | 90 days supply | Qty: 90 | Fill #1

## 2017-05-18 DIAGNOSIS — M5416 Radiculopathy, lumbar region: Secondary | ICD-10-CM | POA: Diagnosis not present

## 2017-05-18 MED FILL — LYRICA 75 MG CAPSULE: 75 | 30 days supply | Qty: 60 | Fill #0

## 2017-06-02 DIAGNOSIS — R0781 Pleurodynia: Secondary | ICD-10-CM | POA: Diagnosis not present

## 2017-06-02 DIAGNOSIS — D179 Benign lipomatous neoplasm, unspecified: Secondary | ICD-10-CM | POA: Diagnosis not present

## 2017-06-25 MED FILL — LYRICA 75 MG CAPSULE: 75 | 30 days supply | Qty: 60 | Fill #1

## 2017-06-28 ENCOUNTER — Other Ambulatory Visit: Payer: Self-pay | Admitting: Allergy and Immunology

## 2017-06-28 MED FILL — raNITIdine HCL 300 MG TABS: 300 | 90 days supply | Qty: 90 | Fill #0

## 2017-06-28 MED FILL — ESOMEPRAZOLE MAG DR 40 MG C: 40 | 90 days supply | Qty: 90 | Fill #1

## 2017-07-08 DIAGNOSIS — J45909 Unspecified asthma, uncomplicated: Secondary | ICD-10-CM | POA: Diagnosis not present

## 2017-07-08 DIAGNOSIS — I10 Essential (primary) hypertension: Secondary | ICD-10-CM | POA: Diagnosis not present

## 2017-07-08 DIAGNOSIS — K219 Gastro-esophageal reflux disease without esophagitis: Secondary | ICD-10-CM | POA: Diagnosis not present

## 2017-07-08 DIAGNOSIS — E7800 Pure hypercholesterolemia, unspecified: Secondary | ICD-10-CM | POA: Diagnosis not present

## 2017-07-13 MED FILL — MONTELUKAST SOD 10 MG TAB: 10 | 90 days supply | Qty: 90 | Fill #1

## 2017-08-02 DIAGNOSIS — M545 Low back pain: Secondary | ICD-10-CM | POA: Diagnosis not present

## 2017-08-02 DIAGNOSIS — M5416 Radiculopathy, lumbar region: Secondary | ICD-10-CM | POA: Diagnosis not present

## 2017-08-02 MED FILL — MELOXICAM 15 MG TABLET: 15 | 60 days supply | Qty: 60 | Fill #0

## 2017-08-04 MED FILL — AMLODIPINE BESYLATE 5 MG TA: 5 | 90 days supply | Qty: 90 | Fill #2

## 2017-08-09 MED FILL — LYRICA 150 MG CAPSULE: 150 | 30 days supply | Qty: 60 | Fill #0

## 2017-08-19 DIAGNOSIS — M5416 Radiculopathy, lumbar region: Secondary | ICD-10-CM | POA: Diagnosis not present

## 2017-08-27 MED FILL — ADVAIR 250/50 DISKUS: 250-50 | 90 days supply | Qty: 180 | Fill #1

## 2017-09-10 DIAGNOSIS — M5416 Radiculopathy, lumbar region: Secondary | ICD-10-CM | POA: Diagnosis not present

## 2017-09-27 ENCOUNTER — Telehealth: Payer: Self-pay | Admitting: Allergy and Immunology

## 2017-09-27 ENCOUNTER — Other Ambulatory Visit: Payer: Self-pay | Admitting: Allergy and Immunology

## 2017-09-27 MED ORDER — RANITIDINE HCL 300 MG PO TABS: ORAL_TABLET | ORAL | 0 refills | Status: DC

## 2017-09-27 MED FILL — ESOMEPRAZOLE MAG DR 40 MG C: 40 | 90 days supply | Qty: 90 | Fill #2

## 2017-09-27 MED FILL — raNITIdine HCL 300 MG TABS: 300 | 90 days supply | Qty: 90 | Fill #0

## 2017-09-27 NOTE — Telephone Encounter (Signed)
Pt made appointment with dr Neldon Mc in feb. 26 and needs to have Ranitidine called into [facility] on [address]. [phone].

## 2017-10-13 MED FILL — MONTELUKAST SOD 10 MG TAB: 10 | 90 days supply | Qty: 90 | Fill #2

## 2017-11-09 MED FILL — AMLODIPINE BESYLATE 5 MG TA: 5 | 90 days supply | Qty: 90 | Fill #3

## 2017-11-10 DIAGNOSIS — M67911 Unspecified disorder of synovium and tendon, right shoulder: Secondary | ICD-10-CM | POA: Diagnosis not present

## 2017-11-16 ENCOUNTER — Encounter: Payer: Self-pay | Admitting: Allergy and Immunology

## 2017-11-16 ENCOUNTER — Ambulatory Visit: Payer: 59 | Admitting: Allergy and Immunology

## 2017-11-16 VITALS — BP 126/74 | HR 80 | Resp 16 | Ht 66.5 in

## 2017-11-16 DIAGNOSIS — J3089 Other allergic rhinitis: Secondary | ICD-10-CM | POA: Diagnosis not present

## 2017-11-16 DIAGNOSIS — J4540 Moderate persistent asthma, uncomplicated: Secondary | ICD-10-CM | POA: Diagnosis not present

## 2017-11-16 DIAGNOSIS — K219 Gastro-esophageal reflux disease without esophagitis: Secondary | ICD-10-CM

## 2017-11-16 NOTE — Patient Instructions (Addendum)
  1. Continue Advair 250 one inhalation one - two times per day depending on disease activity  2. Continue Singulair 10 mg daily    3. Continue to Treat reflux:   A. Continue generic nexium 40mg  in AM  B. Continue Ranitidine 300mg  in PM  4. Continue OTC Nasacort one spray each nostril 1-2 times per day.    5. Nasal saline, Mucinex DM, ibuprofen, Claritin, and ProAir HFA if needed  6. Return to clinic in 6 months or earlier if problem

## 2017-11-16 NOTE — Progress Notes (Signed)
Follow-up Note  Referring Provider: Alroy Dust, L.Marlou Sa, MD Primary Provider: Alroy Dust, Carlean Jews.Marlou Sa, MD Date of Office Visit: 11/16/2017  Subjective:   Tony Roberts (DOB: Sep 30, 1940) is a 77 y.o. male who returns to the [facility] [place] on 11/16/2017 in re-evaluation of the following:  HPI: Tony Roberts returns to this clinic in reevaluation of his asthma and allergic rhinitis and LPR.  His last visit to this clinic was 02 Feb 2017.  Overall he has done very well during the interval without a significant problem involving his head or chest other than a rare head cold.  Rarely does he use a short acting bronchodilator while continuing to use Advair mostly 1 time per day and a leukotriene modifier and a nasal steroid.  He has not required a systemic steroid or antibiotic to treat any type of respiratory tract issue.  His reflux is under excellent control as long as he continues to use Nexium and ranitidine in combination.  Allergies as of 11/16/2017      Reactions   No Known Allergies       Medication List      ADVAIR DISKUS 250-50 MCG/DOSE Aepb Generic drug:  Fluticasone-Salmeterol USE ONE INHALATION ONE TO TWO TIMES A DAY TO PREVENT COUGH OR WHEEZE. RINSE, GARGLE, AND SPIT AFTER USE.   albuterol 108 (90 Base) MCG/ACT inhaler Commonly known as:  PROVENTIL HFA Inhale two puffs every four to six hours as needed for cough or wheeze.   amLODipine 5 MG tablet Commonly known as:  NORVASC Take 5 mg by mouth every other day.   esomeprazole 40 MG capsule Commonly known as:  NEXIUM TAKE 1 CAPSULE BY MOUTH ONCE DAILY FOR REFLUX   montelukast 10 MG tablet Commonly known as:  SINGULAIR Take 1 tablet (10 mg total) by mouth at bedtime.   NASACORT ALLERGY 24HR 55 MCG/ACT Aero nasal inhaler Generic drug:  triamcinolone Place 1 spray into the nose daily.   ranitidine 300 MG tablet Commonly known as:  ZANTAC 1 tablet by mouth every evening.       Past Medical History:    Diagnosis Date  . Arthritis   . Asthma    last asthma attack at age 30  . GERD (gastroesophageal reflux disease)   . Headache   . Other specified iron deficiency anemias   . PAC (premature atrial contraction)    occasional PAC's    Past Surgical History:  Procedure Laterality Date  . CATARACT EXTRACTION    . HERNIA REPAIR    . SPINE SURGERY  10/29/2016  . TONSILLECTOMY      Review of systems negative except as noted in HPI / PMHx or noted below:  Review of Systems  Constitutional: Negative.   HENT: Negative.   Eyes: Negative.   Respiratory: Negative.   Cardiovascular: Negative.   Gastrointestinal: Negative.   Genitourinary: Negative.   Musculoskeletal: Negative.   Skin: Negative.   Neurological: Negative.   Endo/Heme/Allergies: Negative.   Psychiatric/Behavioral: Negative.      Objective:   Vitals:   11/16/17 1645  BP: 126/74  Pulse: 80  Resp: 16   Height: 5' 6.5" (168.9 cm)      Physical Exam  Constitutional: He is well-developed, well-nourished, and in no distress.  HENT:  Head: Normocephalic.  Right Ear: Tympanic membrane, external ear and ear canal normal.  Left Ear: Tympanic membrane, external ear and ear canal normal.  Nose: Nose normal. No mucosal edema or rhinorrhea.  Mouth/Throat: Uvula is midline, oropharynx  is clear and moist and mucous membranes are normal. No oropharyngeal exudate.  Eyes: Conjunctivae are normal.  Neck: Trachea normal. No tracheal tenderness present. No tracheal deviation present. No thyromegaly present.  Cardiovascular: Normal rate, regular rhythm, S1 normal, S2 normal and normal heart sounds.  No murmur heard. Pulmonary/Chest: Breath sounds normal. No stridor. No respiratory distress. He has no wheezes. He has no rales.  Musculoskeletal: He exhibits no edema.  Lymphadenopathy:       Head (right side): No tonsillar adenopathy present.       Head (left side): No tonsillar adenopathy present.    He has no cervical  adenopathy.  Neurological: He is alert. Gait normal.  Skin: No rash noted. He is not diaphoretic. No erythema. Nails show no clubbing.  Psychiatric: Mood and affect normal.    Diagnostics:    Spirometry was performed and demonstrated an FEV1 of 2.75 at 104 % of predicted.  The patient had an Asthma Control Test with the following results: ACT Total Score: 22.    Assessment and Plan:   1. Asthma, moderate persistent, well-controlled   2. Other allergic rhinitis   3. LPRD (laryngopharyngeal reflux disease)     1. Continue Advair 250 one inhalation one - two times per day depending on disease activity  2. Continue Singulair 10 mg daily    3. Continue to Treat reflux:   A. Continue generic nexium 40mg  in AM  B. Continue Ranitidine 300mg  in PM  4. Continue OTC Nasacort one spray each nostril 1-2 times per day.    5. Nasal saline, Mucinex DM, ibuprofen, Claritin, and ProAir HFA if needed  6. Return to clinic in 6 months or earlier if problem  Tony Roberts appears to be doing quite well on his current plan which includes anti-inflammatory agents for his respiratory tract and aggressive therapy directed against reflux and as long as he continues to do well I will see him back in this clinic in 6 months or earlier if there is a problem.  [name], MD Allergy / Immunology [place]

## 2017-11-17 ENCOUNTER — Encounter: Payer: Self-pay | Admitting: Allergy and Immunology

## 2017-12-09 DIAGNOSIS — M6281 Muscle weakness (generalized): Secondary | ICD-10-CM | POA: Diagnosis not present

## 2017-12-09 DIAGNOSIS — M25511 Pain in right shoulder: Secondary | ICD-10-CM | POA: Diagnosis not present

## 2017-12-09 DIAGNOSIS — M67911 Unspecified disorder of synovium and tendon, right shoulder: Secondary | ICD-10-CM | POA: Diagnosis not present

## 2017-12-30 ENCOUNTER — Other Ambulatory Visit: Payer: Self-pay | Admitting: Allergy and Immunology

## 2017-12-30 MED FILL — raNITIdine HCL 300 MG TABS: 300 | 90 days supply | Qty: 90 | Fill #0

## 2017-12-30 MED FILL — ESOMEPRAZOLE MAG DR 40 MG C: 40 | 90 days supply | Qty: 90 | Fill #3

## 2018-01-05 DIAGNOSIS — J45909 Unspecified asthma, uncomplicated: Secondary | ICD-10-CM | POA: Diagnosis not present

## 2018-01-05 DIAGNOSIS — E7800 Pure hypercholesterolemia, unspecified: Secondary | ICD-10-CM | POA: Diagnosis not present

## 2018-01-05 DIAGNOSIS — I10 Essential (primary) hypertension: Secondary | ICD-10-CM | POA: Diagnosis not present

## 2018-01-05 DIAGNOSIS — K219 Gastro-esophageal reflux disease without esophagitis: Secondary | ICD-10-CM | POA: Diagnosis not present

## 2018-01-14 MED FILL — MONTELUKAST SOD 10 MG TAB: 10 | 90 days supply | Qty: 90 | Fill #3

## 2018-02-15 MED FILL — AMLODIPINE BESYLATE 5 MG TA: 5 | 90 days supply | Qty: 90 | Fill #0

## 2018-03-28 ENCOUNTER — Other Ambulatory Visit: Payer: Self-pay | Admitting: Allergy and Immunology

## 2018-03-28 DIAGNOSIS — K219 Gastro-esophageal reflux disease without esophagitis: Secondary | ICD-10-CM

## 2018-03-28 MED FILL — raNITIdine HCL 300 MG TABS: 300 | 90 days supply | Qty: 90 | Fill #1

## 2018-03-28 MED FILL — ESOMEPRAZOLE MAG DR 40 MG C: 40 | 90 days supply | Qty: 90 | Fill #0

## 2018-04-13 ENCOUNTER — Other Ambulatory Visit: Payer: Self-pay | Admitting: Allergy and Immunology

## 2018-04-13 DIAGNOSIS — J4540 Moderate persistent asthma, uncomplicated: Secondary | ICD-10-CM

## 2018-04-13 MED FILL — MONTELUKAST SOD 10 MG TAB: 10 | 90 days supply | Qty: 90 | Fill #0

## 2018-04-13 MED FILL — ADVAIR 250/50 DISKUS: 250-50 | 90 days supply | Qty: 180 | Fill #0

## 2018-04-26 DIAGNOSIS — M5416 Radiculopathy, lumbar region: Secondary | ICD-10-CM | POA: Diagnosis not present

## 2018-05-16 MED FILL — AMLODIPINE BESYLATE 5 MG TA: 5 | 90 days supply | Qty: 90 | Fill #1

## 2018-05-18 ENCOUNTER — Encounter: Payer: Self-pay | Admitting: Allergy and Immunology

## 2018-05-18 ENCOUNTER — Ambulatory Visit: Payer: 59 | Admitting: Allergy and Immunology

## 2018-05-18 VITALS — BP 122/78 | HR 84 | Resp 16

## 2018-05-18 DIAGNOSIS — K219 Gastro-esophageal reflux disease without esophagitis: Secondary | ICD-10-CM | POA: Diagnosis not present

## 2018-05-18 DIAGNOSIS — J4541 Moderate persistent asthma with (acute) exacerbation: Secondary | ICD-10-CM | POA: Diagnosis not present

## 2018-05-18 DIAGNOSIS — J3089 Other allergic rhinitis: Secondary | ICD-10-CM | POA: Diagnosis not present

## 2018-05-18 NOTE — Patient Instructions (Addendum)
   1. Continue Advair 250 one inhalation one - two times per day depending on disease activity  2. Continue Singulair 10 mg daily    3. Continue to Treat reflux:   A. generic nexium 40mg  in AM  B. Ranitidine 300mg  in PM  4. Continue OTC Nasacort one spray each nostril 1-2 times per day and periods of upper airway symptoms.    5.  Prednisone 30 mg today and follow through with orthopedic surgeon regarding additional steroid injections tomorrow.  6. Nasal saline, Mucinex DM, ibuprofen, Claritin, and ProAir HFA if needed  7. Return to clinic in 6 months or earlier if problem  8.  Obtain fall flu vaccine

## 2018-05-18 NOTE — Progress Notes (Signed)
Follow-up Note  Referring Provider: Alroy Dust, L.Marlou Sa, MD Primary Provider: Alroy Dust, Carlean Jews.Marlou Sa, MD Date of Office Visit: 05/18/2018  Subjective:   Tony Roberts (DOB: March 31, 1941) is a 77 y.o. male who returns to the [facility] [place] on 05/18/2018 in re-evaluation of the following:  HPI: Tony Roberts presents to this clinic in evaluation of asthma and allergic rhinitis and LPR.  His last visit to this clinic was 16 November 2017.  He was doing very well with his respiratory tract disease and did not really have any significant problem and rarely used a short acting bronchodilator and can exert himself without any problem and did not require a systemic steroid or antibiotic to treat any type of respiratory tract issue.  However, about 2 weeks ago he developed nasal congestion and sneezing and laryngitis and wheezing and coughing.  At this point in time he has throat clearing and postnasal drip and wheezing and coughing and he has been using a short acting bronchodilator.  He has not had any chest pain or ugly sputum production or ugly nasal discharge or headaches or fever.  He will be visiting with his orthopedic surgeon tomorrow for a multitude of different steroid injections to address his spinal disease.  Allergies as of 05/18/2018      Reactions   No Known Allergies       Medication List      ADVAIR DISKUS 250-50 MCG/DOSE Aepb Generic drug:  Fluticasone-Salmeterol INHALE 1 PUFF BY MOUTH 1-2 TIMES A DAY TO PREVENT COUGH OR WHEEZE. RINSE, GARGLE, AND SPIT AFTER USE.   albuterol 108 (90 Base) MCG/ACT inhaler Commonly known as:  PROVENTIL HFA;VENTOLIN HFA Inhale two puffs every four to six hours as needed for cough or wheeze.   amLODipine 5 MG tablet Commonly known as:  NORVASC Take 5 mg by mouth every other day.   esomeprazole 40 MG capsule Commonly known as:  NEXIUM TAKE 1 CAPSULE BY MOUTH ONCE DAILY FOR REFLUX   montelukast 10 MG tablet Commonly known as:   SINGULAIR TAKE 1 TABLET (10 MG TOTAL) BY MOUTH AT BEDTIME.   NASACORT ALLERGY 24HR 55 MCG/ACT Aero nasal inhaler Generic drug:  triamcinolone Place 1 spray into the nose daily.   ranitidine 300 MG tablet Commonly known as:  ZANTAC TAKE 1 TABLET BY MOUTH EVERY EVENING       Past Medical History:  Diagnosis Date  . Arthritis   . Asthma    last asthma attack at age 11  . GERD (gastroesophageal reflux disease)   . Headache   . Other specified iron deficiency anemias   . PAC (premature atrial contraction)    occasional PAC's    Past Surgical History:  Procedure Laterality Date  . CATARACT EXTRACTION    . HERNIA REPAIR    . SPINE SURGERY  10/29/2016  . TONSILLECTOMY      Review of systems negative except as noted in HPI / PMHx or noted below:  Review of Systems  Constitutional: Negative.   HENT: Negative.   Eyes: Negative.   Respiratory: Negative.   Cardiovascular: Negative.   Gastrointestinal: Negative.   Genitourinary: Negative.   Musculoskeletal: Negative.   Skin: Negative.   Neurological: Negative.   Endo/Heme/Allergies: Negative.   Psychiatric/Behavioral: Negative.      Objective:   Vitals:   05/18/18 1111  BP: 122/78  Pulse: 84  Resp: 16  SpO2: 97%          Physical Exam  HENT:  Head: Normocephalic.  Right Ear: Tympanic membrane, external ear and ear canal normal.  Left Ear: Tympanic membrane, external ear and ear canal normal.  Nose: Nose normal. No mucosal edema or rhinorrhea.  Mouth/Throat: Uvula is midline, oropharynx is clear and moist and mucous membranes are normal. No oropharyngeal exudate.  Eyes: Conjunctivae are normal.  Neck: Trachea normal. No tracheal tenderness present. No tracheal deviation present. No thyromegaly present.  Cardiovascular: Normal rate, regular rhythm, S1 normal, S2 normal and normal heart sounds.  No murmur heard. Pulmonary/Chest: Breath sounds normal. No stridor. No respiratory distress. He has no wheezes. He  has no rales.  Musculoskeletal: He exhibits no edema.  Lymphadenopathy:       Head (right side): No tonsillar adenopathy present.       Head (left side): No tonsillar adenopathy present.    He has no cervical adenopathy.  Neurological: He is alert.  Skin: No rash noted. He is not diaphoretic. No erythema. Nails show no clubbing.    Diagnostics:    Spirometry was performed and demonstrated an FEV1 of 2.63 at 101 % of predicted.  The patient had an Asthma Control Test with the following results: ACT Total Score: 13.    Assessment and Plan:   1. Asthma, not well controlled, moderate persistent, with acute exacerbation   2. Other allergic rhinitis   3. LPRD (laryngopharyngeal reflux disease)     1. Continue Advair 250 one inhalation one - two times per day depending on disease activity  2. Continue Singulair 10 mg daily    3. Continue to Treat reflux:   A. generic nexium 40mg  in AM  B. Ranitidine 300mg  in PM  4. Continue OTC Nasacort one spray each nostril 1-2 times per day and periods of upper airway symptoms.    5.  Prednisone 30 mg today and follow through with orthopedic surgeon regarding additional steroid injections tomorrow.  6. Nasal saline, Mucinex DM, ibuprofen, Claritin, and ProAir HFA if needed  7. Return to clinic in 6 months or earlier if problem  8.  Obtain fall flu vaccine  I will give [name] a dose of systemic steroids today assuming that his larger dose of systemic steroids administered tomorrow by his orthopedic surgeon will help with the anti-inflammatory effect required to address his recent respiratory tract flareup which appeared to be precipitated by a viral respiratory tract infection.  He will continue to use anti-inflammatory agents for his airway and therapy directed against reflux as noted above and I will see him back in this clinic in 6 months or earlier if there is a problem.  [name], MD Allergy / Immunology [place]

## 2018-05-19 ENCOUNTER — Encounter: Payer: Self-pay | Admitting: Allergy and Immunology

## 2018-05-19 DIAGNOSIS — M5416 Radiculopathy, lumbar region: Secondary | ICD-10-CM | POA: Diagnosis not present

## 2018-05-19 NOTE — Addendum Note (Signed)
Addended by: Lucrezia Starch I on: 05/19/2018 07:32 AM   Modules accepted: Orders

## 2018-06-27 ENCOUNTER — Other Ambulatory Visit: Payer: Self-pay | Admitting: Allergy and Immunology

## 2018-06-27 DIAGNOSIS — K219 Gastro-esophageal reflux disease without esophagitis: Secondary | ICD-10-CM

## 2018-06-27 MED FILL — ESOMEPRAZOLE MAG DR 40 MG C: 40 | 90 days supply | Qty: 90 | Fill #0

## 2018-06-27 MED FILL — raNITIdine HCL 300 MG TABS: 300 | 90 days supply | Qty: 90 | Fill #0

## 2018-07-07 DIAGNOSIS — I10 Essential (primary) hypertension: Secondary | ICD-10-CM | POA: Diagnosis not present

## 2018-07-07 DIAGNOSIS — K219 Gastro-esophageal reflux disease without esophagitis: Secondary | ICD-10-CM | POA: Diagnosis not present

## 2018-07-07 DIAGNOSIS — E7800 Pure hypercholesterolemia, unspecified: Secondary | ICD-10-CM | POA: Diagnosis not present

## 2018-07-07 DIAGNOSIS — J45909 Unspecified asthma, uncomplicated: Secondary | ICD-10-CM | POA: Diagnosis not present

## 2018-07-07 DIAGNOSIS — D649 Anemia, unspecified: Secondary | ICD-10-CM | POA: Diagnosis not present

## 2018-07-13 ENCOUNTER — Other Ambulatory Visit: Payer: Self-pay | Admitting: Allergy and Immunology

## 2018-07-13 DIAGNOSIS — J4540 Moderate persistent asthma, uncomplicated: Secondary | ICD-10-CM

## 2018-07-13 MED FILL — MONTELUKAST SOD 10 MG TAB: 10 | 90 days supply | Qty: 90 | Fill #0

## 2018-08-15 MED FILL — AMLODIPINE BESYLATE 5 MG TA: 5 | 90 days supply | Qty: 90 | Fill #0

## 2018-09-12 ENCOUNTER — Other Ambulatory Visit: Payer: Self-pay | Admitting: Allergy and Immunology

## 2018-09-12 DIAGNOSIS — J4540 Moderate persistent asthma, uncomplicated: Secondary | ICD-10-CM

## 2018-09-12 MED FILL — ADVAIR 250/50 DISKUS: 250-50 | 90 days supply | Qty: 180 | Fill #0

## 2018-09-28 ENCOUNTER — Telehealth: Payer: Self-pay

## 2018-09-28 MED FILL — ESOMEPRAZOLE MAG DR 40 MG C: 40 | 90 days supply | Qty: 90 | Fill #1

## 2018-09-28 NOTE — Telephone Encounter (Signed)
Patient aware of his medication and doesn't want to go to another pharmacy and deal with the same situation. He advised me that he will continue the Nexium medication and will discuss with Dr. Neldon Mc on his next appt.

## 2018-09-28 NOTE — Telephone Encounter (Signed)
Pharmacy does not have any ranitidine or famotidine  medication to dispose to the patient.

## 2018-09-28 NOTE — Telephone Encounter (Signed)
Alternate medication request for Ranitidine. Famotidine is unavailable please advise another alternative per pharmacy.

## 2018-09-28 NOTE — Telephone Encounter (Signed)
Have him try a different pharmacy.

## 2018-09-28 NOTE — Telephone Encounter (Signed)
Please have him use tablets and not capsules of ranitidine.  This formulation of ranitidine appears to be okay.

## 2018-10-13 ENCOUNTER — Other Ambulatory Visit: Payer: Self-pay | Admitting: Allergy and Immunology

## 2018-10-13 DIAGNOSIS — J4540 Moderate persistent asthma, uncomplicated: Secondary | ICD-10-CM

## 2018-10-13 MED FILL — MONTELUKAST SOD 10 MG TAB: 10 | 90 days supply | Qty: 90 | Fill #0

## 2018-11-14 MED FILL — AMLODIPINE BESYLATE 5 MG TA: 5 | 90 days supply | Qty: 90 | Fill #1

## 2018-12-27 ENCOUNTER — Other Ambulatory Visit: Payer: Self-pay

## 2018-12-27 DIAGNOSIS — K219 Gastro-esophageal reflux disease without esophagitis: Secondary | ICD-10-CM

## 2018-12-27 MED ORDER — ESOMEPRAZOLE MAGNESIUM 40 MG PO CPDR: 40.0000 mg | DELAYED_RELEASE_CAPSULE | Freq: Every day | ORAL | 1 refills | Status: DC

## 2018-12-27 NOTE — Telephone Encounter (Signed)
Patient is calling for a 90 day refill on Nexium send to [facility]. This will be a different pharmacy for him.   Please Advise

## 2018-12-30 MED FILL — ESOMEPRAZOLE MAG DR 40 MG C: 40 | 90 days supply | Qty: 90 | Fill #0

## 2019-01-16 ENCOUNTER — Other Ambulatory Visit: Payer: Self-pay | Admitting: Allergy and Immunology

## 2019-01-16 DIAGNOSIS — J4540 Moderate persistent asthma, uncomplicated: Secondary | ICD-10-CM

## 2019-01-16 MED FILL — MONTELUKAST SOD 10 MG TAB: 10 | 90 days supply | Qty: 90 | Fill #0

## 2019-02-15 MED FILL — AMLODIPINE BESYLATE 5 MG TA: 5 | 90 days supply | Qty: 90 | Fill #2

## 2019-03-17 DIAGNOSIS — I10 Essential (primary) hypertension: Secondary | ICD-10-CM | POA: Diagnosis not present

## 2019-03-17 DIAGNOSIS — R05 Cough: Secondary | ICD-10-CM | POA: Diagnosis not present

## 2019-03-17 DIAGNOSIS — J45909 Unspecified asthma, uncomplicated: Secondary | ICD-10-CM | POA: Diagnosis not present

## 2019-03-17 DIAGNOSIS — E7800 Pure hypercholesterolemia, unspecified: Secondary | ICD-10-CM | POA: Diagnosis not present

## 2019-03-17 DIAGNOSIS — K219 Gastro-esophageal reflux disease without esophagitis: Secondary | ICD-10-CM | POA: Diagnosis not present

## 2019-03-17 MED FILL — FAMOTIDINE 40 MG TABLET: 40 | 30 days supply | Qty: 30 | Fill #0

## 2019-03-20 DIAGNOSIS — I10 Essential (primary) hypertension: Secondary | ICD-10-CM | POA: Diagnosis not present

## 2019-03-20 DIAGNOSIS — E7800 Pure hypercholesterolemia, unspecified: Secondary | ICD-10-CM | POA: Diagnosis not present

## 2019-03-27 ENCOUNTER — Other Ambulatory Visit: Payer: Self-pay | Admitting: Allergy and Immunology

## 2019-03-27 DIAGNOSIS — J4540 Moderate persistent asthma, uncomplicated: Secondary | ICD-10-CM

## 2019-03-27 MED FILL — ADVAIR 250/50 DISKUS: 250-50 | 90 days supply | Qty: 180 | Fill #0

## 2019-03-27 MED FILL — ESOMEPRAZOLE MAG DR 40 MG C: 40 | 90 days supply | Qty: 90 | Fill #0

## 2019-04-17 ENCOUNTER — Other Ambulatory Visit: Payer: Self-pay | Admitting: Allergy and Immunology

## 2019-04-17 DIAGNOSIS — J4540 Moderate persistent asthma, uncomplicated: Secondary | ICD-10-CM

## 2019-04-17 MED FILL — FAMOTIDINE 40 MG TABLET: 40 | 30 days supply | Qty: 30 | Fill #1

## 2019-04-18 ENCOUNTER — Other Ambulatory Visit: Payer: Self-pay

## 2019-04-18 ENCOUNTER — Ambulatory Visit: Payer: 59 | Admitting: Allergy and Immunology

## 2019-04-18 VITALS — BP 120/82 | HR 83 | Temp 98.7°F | Resp 16

## 2019-04-18 DIAGNOSIS — J3089 Other allergic rhinitis: Secondary | ICD-10-CM

## 2019-04-18 DIAGNOSIS — J4540 Moderate persistent asthma, uncomplicated: Secondary | ICD-10-CM | POA: Diagnosis not present

## 2019-04-18 DIAGNOSIS — K219 Gastro-esophageal reflux disease without esophagitis: Secondary | ICD-10-CM | POA: Diagnosis not present

## 2019-04-18 MED ORDER — MONTELUKAST SODIUM 10 MG PO TABS: 10.0000 mg | ORAL_TABLET | Freq: Every day | ORAL | 2 refills | Status: DC

## 2019-04-18 MED ORDER — ESOMEPRAZOLE MAGNESIUM 40 MG PO CPDR: 40.0000 mg | DELAYED_RELEASE_CAPSULE | Freq: Two times a day (BID) | ORAL | 1 refills | Status: DC

## 2019-04-18 MED FILL — MONTELUKAST SOD 10 MG TAB: 10 | 90 days supply | Qty: 90 | Fill #0

## 2019-04-18 NOTE — Patient Instructions (Addendum)
   1. Continue Advair 250 one inhalation 1-2 times per day depending on disease activity  2. Continue Singulair 10 mg daily    3. Continue to Treat reflux:   A. INCREASE generic nexium 40mg  2 times per day  B. Famotidine 40 mg in PM  C. DECREASE caffeine consumption  D. No large late meals  E. Elevated head of bed  4. Continue OTC Nasacort one spray each nostril 1-2 times per day during periods of upper airway symptoms.    5.  Nasal saline, Mucinex DM, ibuprofen, Claritin, and ProAir HFA if needed  6. Return to clinic in 6 months or earlier if problem  7.  If still with throat issues will need ENT evaluation  8.  Obtain fall flu vaccine (and COVID vaccine)

## 2019-04-18 NOTE — Progress Notes (Signed)
[facility] [place] [facility] [place]   Follow-up Note  Referring Provider: Alroy Dust, L.Marlou Sa, MD Primary Provider: Alroy Dust, Carlean Jews.Marlou Sa, MD Date of Office Visit: 04/18/2019  Subjective:   Tony Roberts (DOB: 1941-08-05) is a 78 y.o. male who returns to the [facility] on 04/18/2019 in re-evaluation of the following:  HPI: Tony Roberts returns to this clinic in reevaluation of asthma and allergic rhinitis and LPR.  His last visit to this clinic was 18 May 2018.  He has had very good control of his asthma on his current medical therapy which includes Advair and montelukast.  Rarely does use a short acting bronchodilator.  He does not really exercise to any significant degree because of a back and radiculopathy issue.  He has not required a systemic steroid or antibiotic to treat any type of airway issue since his last visit.  He has had very little issues with his nose and he rarely uses any nasal steroid at this point.  He does have "congestion" in his throat.  He has lots of throat clearing and lots of postnasal drip.  He uses Mucinex DM which does not really help this issue very much.  He intermittently gets raspy and hoarse.  This has been a longstanding issue tied up with his LPR but might be somewhat worse recently.  He still regurgitates at least 1 time per week especially if he eats the wrong foods such as a barbecue biscuit.  He still drinks 2 L of diet Dr. Malachi Bonds per day.  Allergies as of 04/18/2019      Reactions   No Known Allergies       Medication List    Advair Diskus 250-50 MCG/DOSE Aepb Generic drug: Fluticasone-Salmeterol INHALE 1 PUFF BY MOUTH 1-2 TIMES A DAY TO PREVENT COUGH OR WHEEZE. RINSE, GARGLE, AND SPIT AFTER USE.   albuterol 108 (90 Base) MCG/ACT inhaler Commonly known as: Proventil HFA Inhale two puffs every four to six hours as needed for cough or wheeze.   amLODipine 5 MG tablet Commonly known as: NORVASC Take 5 mg by mouth  every other day.   esomeprazole 40 MG capsule Commonly known as: NEXIUM Take 1 capsule (40 mg total) by mouth daily.   famotidine 40 MG tablet Commonly known as: PEPCID   montelukast 10 MG tablet Commonly known as: SINGULAIR TAKE 1 TABLET BY MOUTH AT BEDTIME.   Nasacort Allergy 24HR 55 MCG/ACT Aero nasal inhaler Generic drug: triamcinolone Place 1 spray into the nose daily.       Past Medical History:  Diagnosis Date  . Arthritis   . Asthma    last asthma attack at age 58  . GERD (gastroesophageal reflux disease)   . Headache   . Other specified iron deficiency anemias   . PAC (premature atrial contraction)    occasional PAC's    Past Surgical History:  Procedure Laterality Date  . CATARACT EXTRACTION    . HERNIA REPAIR    . SPINE SURGERY  10/29/2016  . TONSILLECTOMY      Review of systems negative except as noted in HPI / PMHx or noted below:  Review of Systems  Constitutional: Negative.   HENT: Negative.   Eyes: Negative.   Respiratory: Negative.   Cardiovascular: Negative.   Gastrointestinal: Negative.   Genitourinary: Negative.   Musculoskeletal: Negative.   Skin: Negative.   Neurological: Negative.   Endo/Heme/Allergies: Negative.   Psychiatric/Behavioral: Negative.      Objective:  Vitals:   04/18/19 1040  BP: 120/82  Pulse: 83  Resp: 16  Temp: 98.7 F (37.1 C)  SpO2: 97%          Physical Exam Constitutional:      Appearance: He is not diaphoretic.  HENT:     Head: Normocephalic.     Right Ear: Tympanic membrane, ear canal and external ear normal.     Left Ear: Tympanic membrane, ear canal and external ear normal.     Nose: Nose normal. No mucosal edema or rhinorrhea.     Mouth/Throat:     Pharynx: Uvula midline. No oropharyngeal exudate.  Eyes:     Conjunctiva/sclera: Conjunctivae normal.  Neck:     Thyroid: No thyromegaly.     Trachea: Trachea normal. No tracheal tenderness or tracheal deviation.  Cardiovascular:      Rate and Rhythm: Normal rate and regular rhythm.     Heart sounds: Normal heart sounds, S1 normal and S2 normal. No murmur.  Pulmonary:     Effort: No respiratory distress.     Breath sounds: Normal breath sounds. No stridor. No wheezing or rales.  Lymphadenopathy:     Head:     Right side of head: No tonsillar adenopathy.     Left side of head: No tonsillar adenopathy.     Cervical: No cervical adenopathy.  Skin:    Findings: No erythema or rash.     Nails: There is no clubbing.   Neurological:     Mental Status: He is alert.     Diagnostics:    Spirometry was performed and demonstrated an FEV1 of 2.50 at 100 % of predicted.  Assessment and Plan:   1. Asthma, moderate persistent, well-controlled   2. Other allergic rhinitis   3. LPRD (laryngopharyngeal reflux disease)     1. Continue Advair 250 one inhalation 1-2 times per day depending on disease activity  2. Continue Singulair 10 mg daily    3. Continue to Treat reflux:   A. INCREASE generic nexium 40mg  2 times per day  B. Famotidine 40 mg in PM  C. DECREASE caffeine consumption  D. No large late meals  E. Elevated head of bed  4. Continue OTC Nasacort one spray each nostril 1-2 times per day during periods of upper airway symptoms.    5.  Nasal saline, Mucinex DM, ibuprofen, Claritin, and ProAir HFA if needed  6. Return to clinic in 6 months or earlier if problem  7.  If still with throat issues will need ENT evaluation  8.  Obtain fall flu vaccine (and COVID vaccine)  [name] appears to be doing pretty good with his airway issue other than the fact that his LPR might still be active in the face of utilizing a proton pump inhibitor and H2 receptor blocker.  We will get him to increase his Nexium to twice a day and hopefully decrease his caffeine consumption and see what happens over the course of the next month.  If he still continues to have significant throat issues we will refer him on to ENT for a look at his  vocal cords.  He will remain on anti-inflammatory agents for both his upper and lower airway and I will see him back in this clinic and 6 months or earlier if there is a problem.  [name], MD Allergy / Immunology [place]

## 2019-04-19 ENCOUNTER — Encounter: Payer: Self-pay | Admitting: Allergy and Immunology

## 2019-05-16 MED FILL — AMLODIPINE BESYLATE 5 MG TA: 5 | 90 days supply | Qty: 90 | Fill #3

## 2019-05-16 MED FILL — FAMOTIDINE 40 MG TABLET: 40 | 30 days supply | Qty: 30 | Fill #2

## 2019-06-15 MED FILL — FAMOTIDINE 40 MG TABLET: 40 | 30 days supply | Qty: 30 | Fill #3

## 2019-06-27 ENCOUNTER — Other Ambulatory Visit: Payer: Self-pay | Admitting: Allergy and Immunology

## 2019-06-27 DIAGNOSIS — K219 Gastro-esophageal reflux disease without esophagitis: Secondary | ICD-10-CM

## 2019-07-17 MED FILL — MONTELUKAST SOD 10 MG TAB: 10 | 90 days supply | Qty: 90 | Fill #1

## 2019-07-21 MED FILL — FAMOTIDINE 40 MG TABLET: 40 | 30 days supply | Qty: 30 | Fill #4

## 2019-08-21 MED FILL — AMLODIPINE BESYLATE 5 MG TA: 5 | 90 days supply | Qty: 90 | Fill #0

## 2019-08-21 MED FILL — FAMOTIDINE 40 MG TABLET: 40 | 30 days supply | Qty: 30 | Fill #5

## 2019-09-04 ENCOUNTER — Other Ambulatory Visit: Payer: Self-pay | Admitting: Allergy and Immunology

## 2019-09-04 DIAGNOSIS — J4540 Moderate persistent asthma, uncomplicated: Secondary | ICD-10-CM

## 2019-09-04 MED FILL — ADVAIR 250/50 DISKUS: 250-50 | 90 days supply | Qty: 180 | Fill #0

## 2019-09-18 DIAGNOSIS — K219 Gastro-esophageal reflux disease without esophagitis: Secondary | ICD-10-CM | POA: Diagnosis not present

## 2019-09-18 DIAGNOSIS — E7800 Pure hypercholesterolemia, unspecified: Secondary | ICD-10-CM | POA: Diagnosis not present

## 2019-09-18 DIAGNOSIS — I10 Essential (primary) hypertension: Secondary | ICD-10-CM | POA: Diagnosis not present

## 2019-09-18 DIAGNOSIS — R05 Cough: Secondary | ICD-10-CM | POA: Diagnosis not present

## 2019-09-18 DIAGNOSIS — J45909 Unspecified asthma, uncomplicated: Secondary | ICD-10-CM | POA: Diagnosis not present

## 2019-09-19 MED FILL — FAMOTIDINE 40 MG TABLET: 40 | 30 days supply | Qty: 30 | Fill #6

## 2019-10-16 MED FILL — MONTELUKAST SOD 10 MG TAB: 10 | 90 days supply | Qty: 90 | Fill #2

## 2019-10-23 MED FILL — FAMOTIDINE 40 MG TABLET: 40 | 30 days supply | Qty: 30 | Fill #7

## 2019-11-22 MED FILL — FAMOTIDINE 40 MG TABLET: 40 | 30 days supply | Qty: 30 | Fill #8

## 2019-11-27 MED FILL — AMLODIPINE BESYLATE 5 MG TA: 5 | 90 days supply | Qty: 90 | Fill #1

## 2020-01-01 MED FILL — FAMOTIDINE 40 MG TABLET: 40 | 30 days supply | Qty: 30 | Fill #9

## 2020-01-01 MED FILL — ESOMEPRAZOLE MAG DR 40 MG C: 40 | 90 days supply | Qty: 180 | Fill #0

## 2020-01-15 ENCOUNTER — Other Ambulatory Visit: Payer: Self-pay | Admitting: Allergy and Immunology

## 2020-01-15 DIAGNOSIS — J4540 Moderate persistent asthma, uncomplicated: Secondary | ICD-10-CM

## 2020-01-18 ENCOUNTER — Telehealth: Payer: Self-pay | Admitting: Allergy and Immunology

## 2020-01-18 DIAGNOSIS — J4540 Moderate persistent asthma, uncomplicated: Secondary | ICD-10-CM

## 2020-01-18 MED ORDER — MONTELUKAST SODIUM 10 MG PO TABS: 10.0000 mg | ORAL_TABLET | Freq: Every day | ORAL | 0 refills | Status: DC

## 2020-01-18 MED FILL — MONTELUKAST SOD 10 MG TAB: 10 | 90 days supply | Qty: 90 | Fill #0

## 2020-01-18 NOTE — Telephone Encounter (Signed)
Courtesy refill has been sent in. Called patient and informed. Patient verbalized understanding.

## 2020-01-18 NOTE — Telephone Encounter (Signed)
Patient called and made appointment for 01/23/2020 and needs to have montelukast called [facility] to [facility] [address]. [phone].  Now. He is out of it

## 2020-01-23 ENCOUNTER — Other Ambulatory Visit: Payer: Self-pay

## 2020-01-23 ENCOUNTER — Ambulatory Visit (INDEPENDENT_AMBULATORY_CARE_PROVIDER_SITE_OTHER): Payer: Medicare Other | Admitting: Allergy and Immunology

## 2020-01-23 ENCOUNTER — Other Ambulatory Visit (HOSPITAL_COMMUNITY): Payer: Self-pay | Admitting: Allergy and Immunology

## 2020-01-23 ENCOUNTER — Encounter: Payer: Self-pay | Admitting: Allergy and Immunology

## 2020-01-23 VITALS — BP 120/70 | HR 75 | Temp 97.5°F | Resp 16 | Wt 190.4 lb

## 2020-01-23 DIAGNOSIS — J4540 Moderate persistent asthma, uncomplicated: Secondary | ICD-10-CM | POA: Diagnosis not present

## 2020-01-23 DIAGNOSIS — J3089 Other allergic rhinitis: Secondary | ICD-10-CM

## 2020-01-23 DIAGNOSIS — K219 Gastro-esophageal reflux disease without esophagitis: Secondary | ICD-10-CM

## 2020-01-23 MED ORDER — OMEPRAZOLE 40 MG PO CPDR
40.0000 mg | DELAYED_RELEASE_CAPSULE | Freq: Two times a day (BID) | ORAL | 5 refills | Status: DC
Start: 2020-01-23 — End: 2020-08-28

## 2020-01-23 MED ORDER — MONTELUKAST SODIUM 10 MG PO TABS: 10.0000 mg | ORAL_TABLET | Freq: Every day | ORAL | 0 refills | Status: DC

## 2020-01-23 MED FILL — OMEPRAZOLE 40 MG CPDR: 40 | 30 days supply | Qty: 60 | Fill #0

## 2020-01-23 NOTE — Patient Instructions (Addendum)
   1. Continue Advair 250 one inhalation 1-2 times per day depending on disease activity  2. Continue Singulair 10 mg daily    3. Continue to Treat reflux:   A. Omeprazole 40mg  2 times per day (replaces Nexium)  B. Famotidine 40 mg in PM  4.  Continue OTC Nasacort one spray each nostril 1-2 times per day during periods of upper airway symptoms.    5.  Nasal saline, Mucinex DM, ibuprofen, Claritin, and ProAir HFA if needed  6.  Return to clinic in 12 months or earlier if problem  7.  Obtain COVID vaccine

## 2020-01-23 NOTE — Progress Notes (Signed)
[place] [facility] [place] [facility] [place]   Follow-up Note  Referring Provider: Alroy Dust, L.Marlou Sa, MD Primary Provider: Alroy Dust, Carlean Jews.Marlou Sa, MD Date of Office Visit: 01/23/2020  Subjective:   Tony Roberts (DOB: 1941/05/02) is a 79 y.o. male who returns to the [facility] [place] on 01/23/2020 in re-evaluation of the following:  HPI: [name] returns to this clinic in evaluation of asthma and allergic rhinitis and LPR.  His last visit to this clinic was 18 April 2019.  He has really done well since his last visit.  He has not required a systemic steroid or an antibiotic for any type of airway issue and he rarely uses a short acting bronchodilator and he can exert himself to the extent that he so desires while consistently using Advair mostly 1 time per day and continuing on a nasal steroid.  He believes that his reflux is under very good control as is his throat issue while using Nexium twice a day and famotidine in the evening.  He is having some problems with [place] healthcare advantage plan paying for his Nexium.  He still continues to consume significant amounts of caffeine throughout the day.  Allergies as of 01/23/2020      Reactions   No Known Allergies       Medication List      Advair Diskus 250-50 MCG/DOSE Aepb Generic drug: Fluticasone-Salmeterol INHALE 1 PUFF BY MOUTH 1-2 TIMES A DAY TO PREVENT COUGH OR WHEEZE. RINSE, GARGLE, AND SPIT AFTER USE.   albuterol 108 (90 Base) MCG/ACT inhaler Commonly known as: Proventil HFA Inhale two puffs every four to six hours as needed for cough or wheeze.   amLODipine 5 MG tablet Commonly known as: NORVASC Take 5 mg by mouth every other day.   esomeprazole 40 MG capsule Commonly known as: NEXIUM Take one capsule twice daily   famotidine 40 MG tablet Commonly known as: PEPCID   montelukast 10 MG tablet Commonly known as: SINGULAIR Take 1 tablet (10 mg total) by mouth at bedtime.   Nasacort Allergy 24HR  55 MCG/ACT Aero nasal inhaler Generic drug: triamcinolone Place 1 spray into the nose daily.       Past Medical History:  Diagnosis Date  . Arthritis   . Asthma    last asthma attack at age 11  . GERD (gastroesophageal reflux disease)   . Headache   . Other specified iron deficiency anemias   . PAC (premature atrial contraction)    occasional PAC's    Past Surgical History:  Procedure Laterality Date  . CATARACT EXTRACTION    . HERNIA REPAIR    . SPINE SURGERY  10/29/2016  . TONSILLECTOMY      Review of systems negative except as noted in HPI / PMHx or noted below:  Review of Systems  Constitutional: Negative.   HENT: Negative.   Eyes: Negative.   Respiratory: Negative.   Cardiovascular: Negative.   Gastrointestinal: Negative.   Genitourinary: Negative.   Musculoskeletal: Negative.   Skin: Negative.   Neurological: Negative.   Endo/Heme/Allergies: Negative.   Psychiatric/Behavioral: Negative.      Objective:   Vitals:   01/23/20 0857  BP: 120/70  Pulse: 75  Resp: 16  Temp: (!) 97.5 F (36.4 C)  SpO2: 97%      Weight: 190 lb 6.4 oz (86.4 kg)   Physical Exam Constitutional:      Appearance: He is not diaphoretic.  HENT:     Head: Normocephalic.  Right Ear: Tympanic membrane, ear canal and external ear normal.     Left Ear: Tympanic membrane, ear canal and external ear normal.     Nose: Nose normal. No mucosal edema or rhinorrhea.     Mouth/Throat:     Pharynx: Uvula midline. No oropharyngeal exudate.  Eyes:     Conjunctiva/sclera: Conjunctivae normal.  Neck:     Thyroid: No thyromegaly.     Trachea: Trachea normal. No tracheal tenderness or tracheal deviation.  Cardiovascular:     Rate and Rhythm: Normal rate and regular rhythm.     Heart sounds: Normal heart sounds, S1 normal and S2 normal. No murmur.  Pulmonary:     Effort: No respiratory distress.     Breath sounds: Normal breath sounds. No stridor. No wheezing or rales.    Lymphadenopathy:     Head:     Right side of head: No tonsillar adenopathy.     Left side of head: No tonsillar adenopathy.     Cervical: No cervical adenopathy.  Skin:    Findings: No erythema or rash.     Nails: There is no clubbing.  Neurological:     Mental Status: He is alert.     Diagnostics:    Spirometry was performed and demonstrated an FEV1 of 2.70 at 109 % of predicted.  The patient had an Asthma Control Test with the following results: ACT Total Score: 25.    Assessment and Plan:   1. Asthma, moderate persistent, well-controlled   2. Other allergic rhinitis   3. LPRD (laryngopharyngeal reflux disease)     1. Continue Advair 250 one inhalation 1-2 times per day depending on disease activity  2. Continue Singulair 10 mg daily    3. Continue to Treat reflux:   A. Omeprazole 40mg  2 times per day (replaces Nexium)  B. Famotidine 40 mg in PM  4.  Continue OTC Nasacort one spray each nostril 1-2 times per day during periods of upper airway symptoms.    5.  Nasal saline, Mucinex DM, ibuprofen, Claritin, and ProAir HFA if needed  6.  Return to clinic in 12 months or earlier if problem  7.  Obtain COVID vaccine  [name] appears to be doing quite well on his current therapy which includes anti-inflammatory medications for his airway and therapy directed against reflux.  Because of his insurance difficulties we will have him use omeprazole in place of Nexium.  I encouraged him to obtain the Covid vaccine as soon as possible.  Assuming he does well with this plan I will see him back in his clinic in 12 months or earlier if there is a problem.   [name], MD Allergy / Immunology [place]

## 2020-01-24 ENCOUNTER — Encounter: Payer: Self-pay | Admitting: Allergy and Immunology

## 2020-02-06 MED FILL — FAMOTIDINE 40 MG TABLET: 40 | 30 days supply | Qty: 30 | Fill #10

## 2020-03-15 MED FILL — CYCLOBENZAPRINE HCL 10 MG T: 10 | 30 days supply | Qty: 30 | Fill #0

## 2020-03-15 MED FILL — GABAPENTIN 300 MG CAPSULE: 300 | 30 days supply | Qty: 30 | Fill #0

## 2020-04-01 ENCOUNTER — Other Ambulatory Visit: Payer: Self-pay | Admitting: Allergy and Immunology

## 2020-04-01 DIAGNOSIS — J4540 Moderate persistent asthma, uncomplicated: Secondary | ICD-10-CM

## 2020-04-01 MED FILL — ADVAIR 250/50 DISKUS: 250-50 | 90 days supply | Qty: 180 | Fill #0

## 2020-04-01 MED FILL — CYCLOBENZAPRINE HCL 10 MG T: 10 | 30 days supply | Qty: 30 | Fill #0

## 2020-04-02 ENCOUNTER — Other Ambulatory Visit (HOSPITAL_COMMUNITY): Payer: Self-pay | Admitting: Family Medicine

## 2020-04-02 MED FILL — FAMOTIDINE 40 MG TABLET: 40 | 30 days supply | Qty: 30 | Fill #0

## 2020-04-23 ENCOUNTER — Other Ambulatory Visit: Payer: Self-pay | Admitting: Allergy and Immunology

## 2020-04-23 MED FILL — MONTELUKAST SOD 10 MG TAB: 10 | 90 days supply | Qty: 90 | Fill #0

## 2020-05-08 MED FILL — FAMOTIDINE 40 MG TABLET: 40 | 30 days supply | Qty: 30 | Fill #1

## 2020-05-17 MED FILL — FAMOTIDINE 40 MG TABLET: 40 | 30 days supply | Qty: 30 | Fill #1

## 2020-06-26 ENCOUNTER — Other Ambulatory Visit (HOSPITAL_COMMUNITY): Payer: Self-pay | Admitting: Allergy and Immunology

## 2020-06-26 ENCOUNTER — Other Ambulatory Visit: Payer: Self-pay | Admitting: Allergy and Immunology

## 2020-06-26 DIAGNOSIS — K219 Gastro-esophageal reflux disease without esophagitis: Secondary | ICD-10-CM

## 2020-06-26 MED FILL — ESOMEPRAZOLE MAG DR 40 MG C: 40 | 90 days supply | Qty: 180 | Fill #0

## 2020-07-16 MED FILL — FAMOTIDINE 40 MG TABLET: 40 | 30 days supply | Qty: 30 | Fill #2

## 2020-07-22 MED FILL — AMLODIPINE BESYLATE 5 MG TA: 5 | 90 days supply | Qty: 90 | Fill #3

## 2020-07-22 MED FILL — MONTELUKAST SOD 10 MG TAB: 10 | 90 days supply | Qty: 90 | Fill #0

## 2020-07-30 ENCOUNTER — Other Ambulatory Visit: Payer: Self-pay

## 2020-07-30 DIAGNOSIS — R200 Anesthesia of skin: Secondary | ICD-10-CM

## 2020-08-05 ENCOUNTER — Ambulatory Visit (INDEPENDENT_AMBULATORY_CARE_PROVIDER_SITE_OTHER): Payer: Medicare Other

## 2020-08-05 ENCOUNTER — Other Ambulatory Visit: Payer: Self-pay

## 2020-08-05 DIAGNOSIS — R200 Anesthesia of skin: Secondary | ICD-10-CM | POA: Diagnosis not present

## 2020-08-05 IMAGING — MR MR HEAD WO/W CM
12 series · 48 of 48 positions shown · IV contrast (gadavist)
Comparison: None.

CLINICAL DATA: 79-year-old male with recurrent episodes of left
side numbness in the left face and extremities over the last several
weeks. Each episode 20-30 minutes.

EXAM:
MRI HEAD WITHOUT AND WITH CONTRAST
TECHNIQUE: Multiplanar, multiecho pulse sequences of the brain and surrounding
structures were obtained without and with intravenous contrast.
CONTRAST:  8mL GADAVIST GADOBUTROL 1 MMOL/ML IV SOLN

[Series 3: DWI · axial · 3.0mm · 1.20mm/px · z∈[-35,+123]mm · 3 of 55 slices shown (1 of 2)]
[im 1/55]
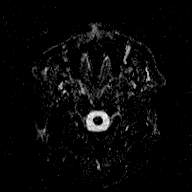
[im 28/55]
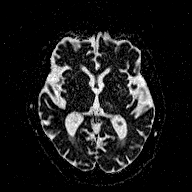
[im 55/55]
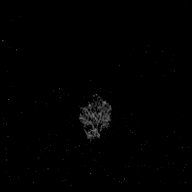

[Series 5: DWI · coronal · 3.0mm · 1.15mm/px · 2 of 45 slices shown (2 of 2)]
[im 1/45]
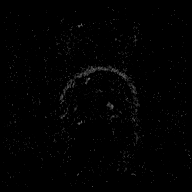
[im 45/45]
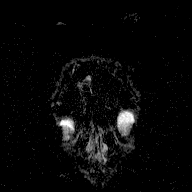

[Series 6: T1 · sagittal · 5.0mm · 0.45mm/px · 2 of 23 slices shown (1 of 2)]
[im 1/23]
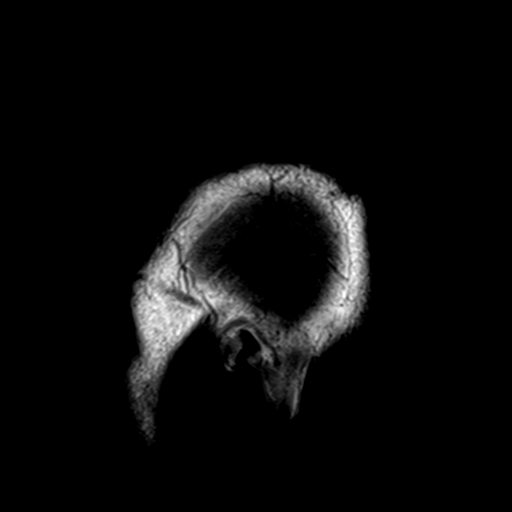
[im 23/23]
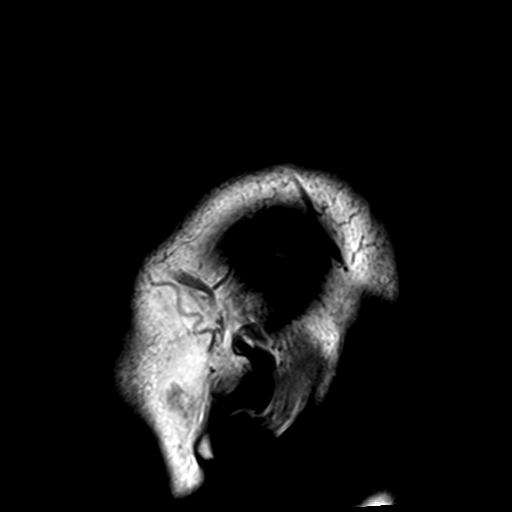

[Series 7: T2 · axial · 5.0mm · 0.72mm/px · z∈[-43,+110]mm · 2 of 23 slices shown (1 of 2)]
[im 1/23]
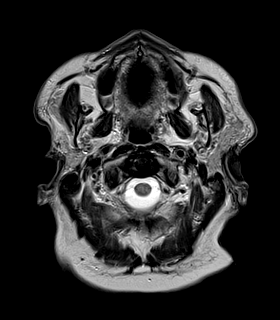
[im 23/23]
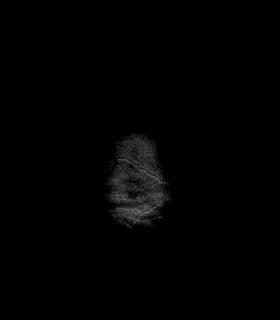

[Series 8: FLAIR · axial · 3.0mm · 0.45mm/px · z∈[-47,+114]mm · 4 of 55 slices shown]
[im 1/55]
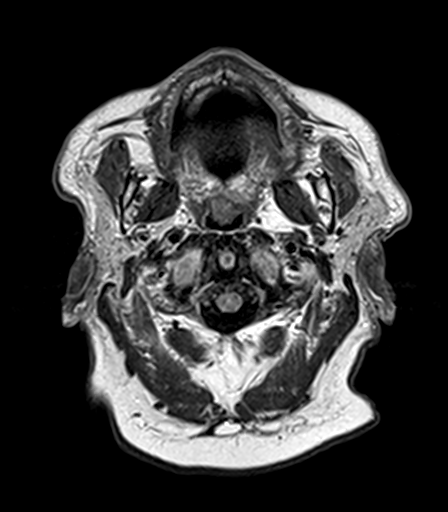
[im 19/55]
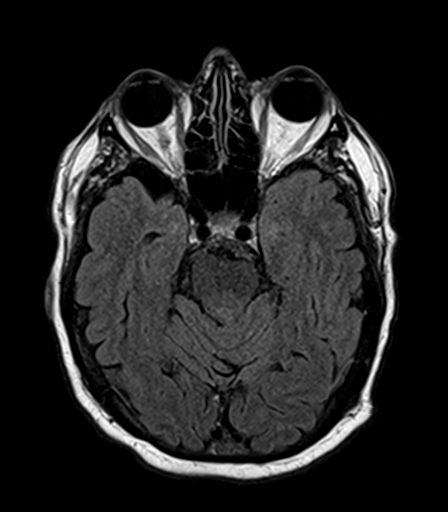
[im 37/55]
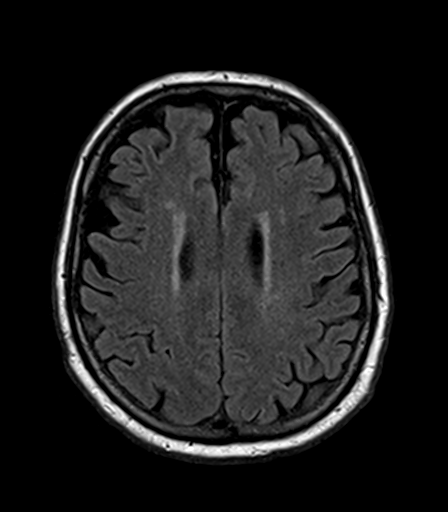
[im 55/55]
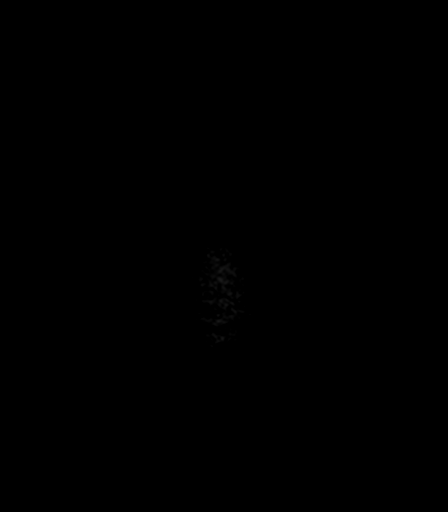

[Series 9: T2 · axial · 5.0mm · 0.72mm/px · z∈[-43,+110]mm · 2 of 23 slices shown (2 of 2)]
[im 1/23]
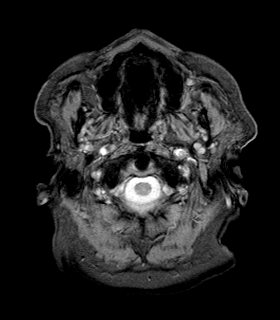
[im 23/23]
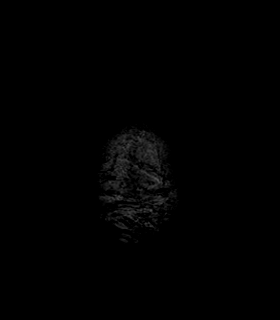

[Series 10: T1 · axial · 1.0mm · 1.00mm/px · z∈[-44,+114]mm · 11 of 160 slices shown (2 of 2)]
[im 1/160]
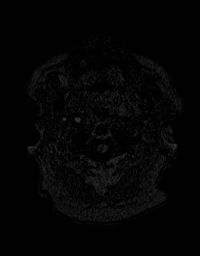
[im 16/160]
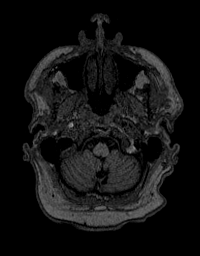
[im 32/160]
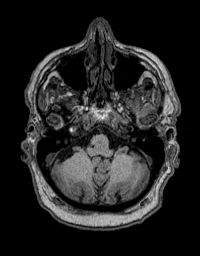
[im 48/160]
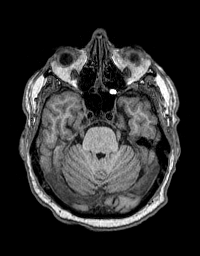
[im 64/160]
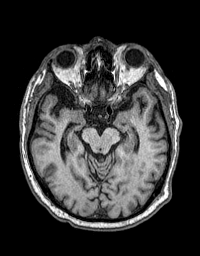
[im 80/160]
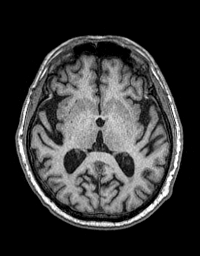
[im 96/160]
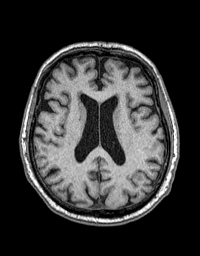
[im 112/160]
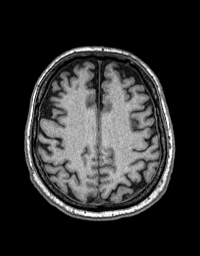
[im 128/160]
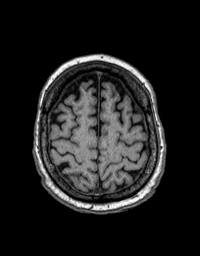
[im 144/160]
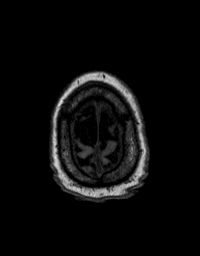
[im 160/160]
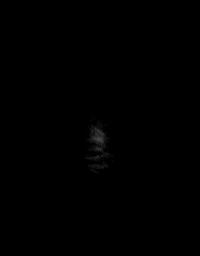

[Series 11: T2 post-contrast · coronal · 5.0mm · 0.43mm/px · 2 of 31 slices shown]
[im 1/31]
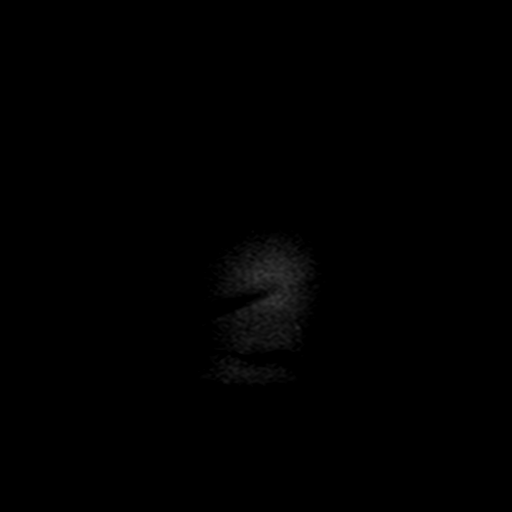
[im 31/31]
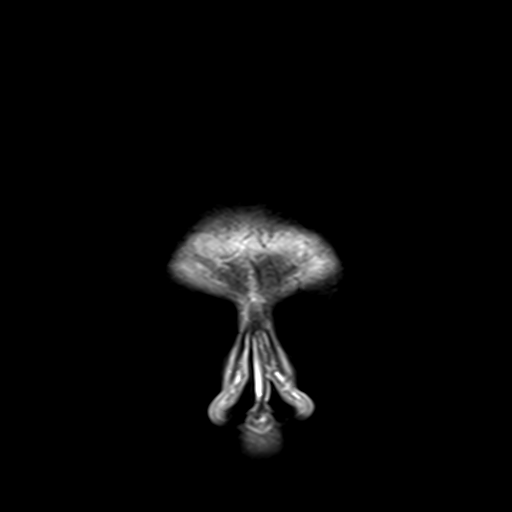

[Series 12: T1 post-contrast · axial · 1.0mm · 1.00mm/px · z∈[-44,+114]mm · 11 of 160 slices shown (1 of 2)]
[im 1/160]
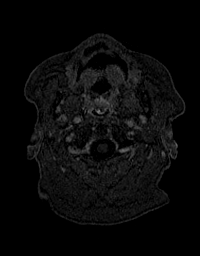
[im 16/160]
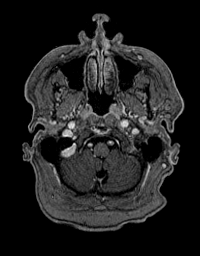
[im 32/160]
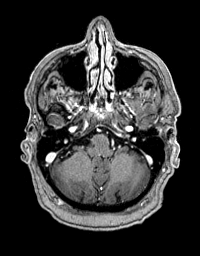
[im 48/160]
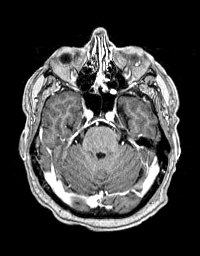
[im 64/160]
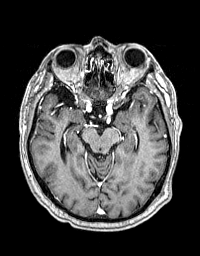
[im 80/160]
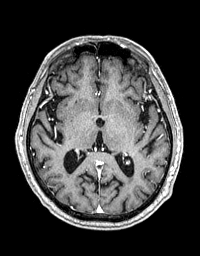
[im 96/160]
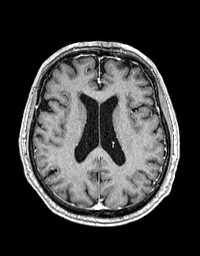
[im 112/160]
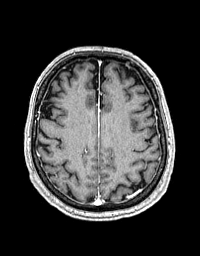
[im 128/160]
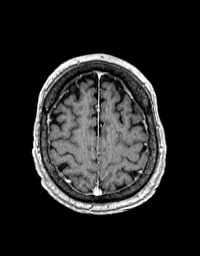
[im 144/160]
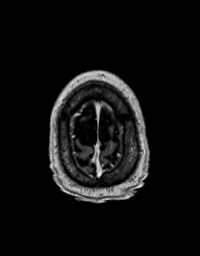
[im 160/160]
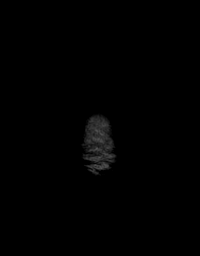

[Series 13: T1 post-contrast · coronal · 5.0mm · 0.43mm/px · 2 of 31 slices shown (2 of 2)]
[im 1/31]
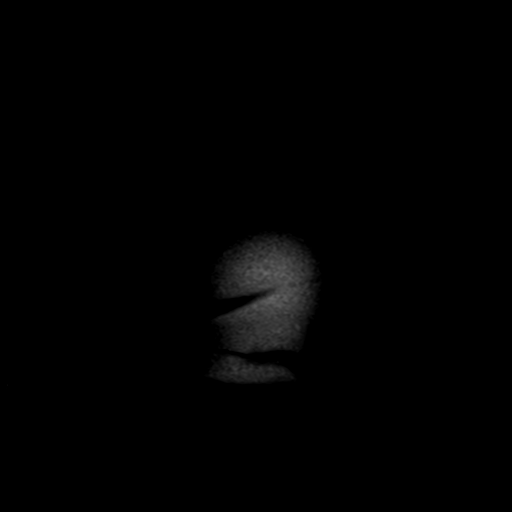
[im 31/31]
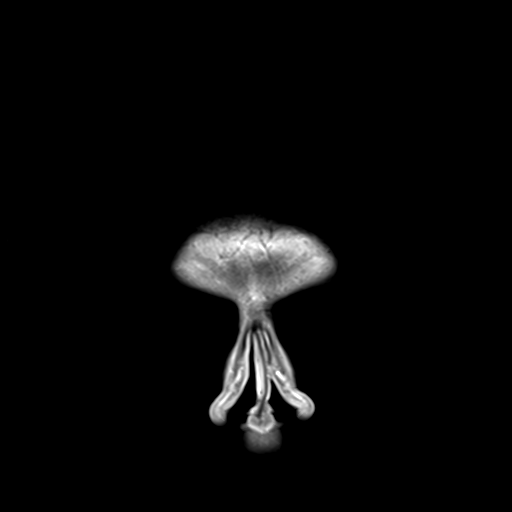

[Series 100: (id) ax · axial · 3.0mm · 1.20mm/px · z∈[-35,+123]mm · 4 of 54 slices shown]
[im 1/54]
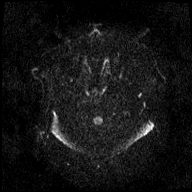
[im 18/54]
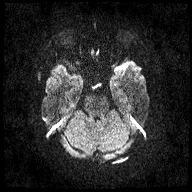
[im 36/54]
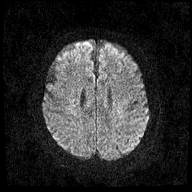
[im 54/54]
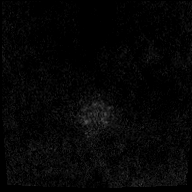

[Series 101: (id) cor · coronal · 3.0mm · 1.15mm/px · 3 of 41 slices shown]
[im 1/41]
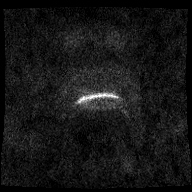
[im 21/41]
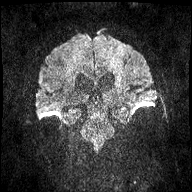
[im 41/41]
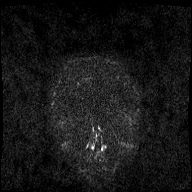

[48 of 48 positions shown; findings below may reference images not displayed]

FINDINGS: Brain: No restricted diffusion to suggest acute infarction. No
midline shift, mass effect, evidence of mass lesion,
ventriculomegaly, extra-axial collection or acute intracranial
hemorrhage. Cervicomedullary junction and pituitary are within
normal limits.

Gray and white matter signal is largely normal for age throughout
the brain.

Minimal for age nonspecific scattered small cerebral white matter T2
and FLAIR hyperintense foci, such as series 8, image 36. No cortical
encephalomalacia or chronic cerebral blood products identified. Deep
gray matter nuclei and brainstem appear within normal limits.

There is a subtle chronic infarct suspected in the left lateral
cerebellum (series 10, image 35).

No abnormal enhancement identified.  No dural thickening.

Vascular: Major intracranial vascular flow voids are preserved. The
major dural venous sinuses are enhancing and appear to be patent.

Skull and upper cervical spine: Congenital incomplete segmentation
of the C2-C3 vertebrae. Otherwise negative visible upper cervical
spine. Visualized bone marrow signal is within normal limits.

Sinuses/Orbits: Postoperative changes to both globes, otherwise
negative.

Other: Mastoids are clear. Visible internal auditory structures
appear normal. Visible scalp and face soft tissues appear negative.
IMPRESSION: No acute intracranial abnormality and largely normal for age MRI
appearance of the brain. A tiny chronic infarct is suspected in the
left cerebellum.

## 2020-08-05 MED ORDER — GADOBUTROL 1 MMOL/ML IV SOLN
8.0000 mL | Freq: Once | INTRAVENOUS | Status: AC | PRN
Start: 2020-08-05 — End: 2020-08-05
  Administered 2020-08-05: 8 mL via INTRAVENOUS

## 2020-08-19 ENCOUNTER — Ambulatory Visit: Payer: Medicare Other | Admitting: Allergy and Immunology

## 2020-08-19 ENCOUNTER — Other Ambulatory Visit: Payer: Self-pay

## 2020-08-19 ENCOUNTER — Encounter: Payer: Self-pay | Admitting: Allergy and Immunology

## 2020-08-19 VITALS — BP 142/92 | HR 130 | Resp 20 | Ht 65.0 in | Wt 198.0 lb

## 2020-08-19 DIAGNOSIS — J4551 Severe persistent asthma with (acute) exacerbation: Secondary | ICD-10-CM | POA: Diagnosis not present

## 2020-08-19 DIAGNOSIS — J3089 Other allergic rhinitis: Secondary | ICD-10-CM | POA: Diagnosis not present

## 2020-08-19 DIAGNOSIS — K219 Gastro-esophageal reflux disease without esophagitis: Secondary | ICD-10-CM

## 2020-08-19 DIAGNOSIS — R000 Tachycardia, unspecified: Secondary | ICD-10-CM

## 2020-08-19 MED ORDER — METHYLPREDNISOLONE ACETATE 80 MG/ML IJ SUSP
80.0000 mg | Freq: Once | INTRAMUSCULAR | Status: AC
  Administered 2020-08-19: 80 mg via INTRAMUSCULAR

## 2020-08-19 NOTE — Patient Instructions (Addendum)
   1. Continue Advair 250 one inhalation 1-2 times per day depending on disease activity  2. Continue Singulair 10 mg daily    3. Continue to Treat reflux:   A. Omeprazole 40mg  2 times per day (replaces Nexium)  B. Famotidine 40 mg in PM  4.  Continue OTC Nasacort one spray each nostril 1-2 times per day during periods of upper airway symptoms.    5.  Nasal saline, Mucinex DM, ibuprofen, Claritin, and ProAir HFA if needed  6. For this recent episode:   A. Depomedrol 80 IM delivered in clinic today   B. Visit with ER, UC, or Dr. Alroy Dust today about rapid heart rate.  7. Further evaluation for possible atrial fibrillation and TIA???  8.  Return to clinic in 12 months or earlier if problem

## 2020-08-19 NOTE — Progress Notes (Signed)
[place] [facility] [place] [facility] [place]   Follow-up Note   Referring Provider: Alroy Dust, L.Marlou Sa, MD Primary Provider: Alroy Dust, Carlean Jews.Marlou Sa, MD Date of Office Visit: 08/19/2020  Subjective:   Tony Roberts (DOB: [birth year]) is a 79 y.o. male who returns to the [facility] [place] on 08/19/2020 in re-evaluation of the following:  HPI: Tony Roberts returns to this clinic in reevaluation of asthma and allergic rhinitis and LPR.  His last visit to this clinic was 23 Jan 2020.  He was doing very well with his respiratory tract issue not requiring a systemic steroid or antibiotic and rare use of a short acting bronchodilator while he remained on therapy directed against respiratory tract inflammation including use of Advair just 1 time per day and therapy directed against reflux.  Unfortunately, 6 days ago, he developed relatively acute onset of feeling as though he could not breathe very good.  He had a little bit of runny nose and then he has developed chest tightness and coughing at night and cannot really exert himself to any degree without getting very short of breath.  When he uses a short acting bronchodilator he does get short-term relief.  Has not had any fever or ugly nasal discharge or chest pain or ugly sputum production only ugly nasal discharge.  Interestingly, he has apparently had several TIAs with his first one occurring on 19 July 2020 and a another 1 on 8 29 July 2020 and 02 August 2020.  These predominantly involve his left hand and arm and left face.  He feels as though he cannot really control his arm or face and on one occasion he could not really speak.  They lasted about 25 minutes.  Apparently had an MRI of his brain which did not identify any significant abnormality.  Allergies as of 08/19/2020      Reactions   No Known Allergies       Medication List    Advair Diskus 250-50 MCG/DOSE Aepb Generic drug: Fluticasone-Salmeterol INHALE 1 PUFF BY  MOUTH ONCE TO TWICE A DAY TO PREVENT COUGH OR WHEEZE. RINSE, GARGLE, AND SPIT AFTER USE.   albuterol 108 (90 Base) MCG/ACT inhaler Commonly known as: Proventil HFA Inhale two puffs every four to six hours as needed for cough or wheeze.   amLODipine 5 MG tablet Commonly known as: NORVASC Take 5 mg by mouth every other day.   famotidine 40 MG tablet Commonly known as: PEPCID   montelukast 10 MG tablet Commonly known as: SINGULAIR TAKE 1 TABLET (10 MG TOTAL) BY MOUTH AT BEDTIME.   Nasacort Allergy 24HR 55 MCG/ACT Aero nasal inhaler Generic drug: triamcinolone Place 1 spray into the nose daily.   omeprazole 40 MG capsule Commonly known as: PRILOSEC Take 1 capsule (40 mg total) by mouth in the morning and at bedtime.       Past Medical History:  Diagnosis Date  . Arthritis   . Asthma    last asthma attack at age 44  . GERD (gastroesophageal reflux disease)   . Headache   . Other specified iron deficiency anemias   . PAC (premature atrial contraction)    occasional PAC's    Past Surgical History:  Procedure Laterality Date  . CATARACT EXTRACTION    . HERNIA REPAIR    . SPINE SURGERY  10/29/2016  . TONSILLECTOMY      Review of systems negative except as noted in HPI / PMHx or noted below:  Review of Systems  Constitutional: Negative.   HENT: Negative.   Eyes: Negative.   Respiratory: Negative.   Cardiovascular: Negative.   Gastrointestinal: Negative.   Genitourinary: Negative.   Musculoskeletal: Negative.   Skin: Negative.   Neurological: Negative.   Endo/Heme/Allergies: Negative.   Psychiatric/Behavioral: Negative.      Objective:   Vitals:   08/19/20 1341  BP: (!) 142/92  Pulse: (!) 130  Resp: 20  SpO2: 96%   Height: 5\' 5"  (165.1 cm)  Weight: 198 lb (89.8 kg)   Physical Exam Constitutional:      Appearance: He is not diaphoretic.  HENT:     Head: Normocephalic.     Right Ear: Tympanic membrane, ear canal and external ear normal.     Left  Ear: Tympanic membrane, ear canal and external ear normal.     Nose: Nose normal. No mucosal edema or rhinorrhea.     Mouth/Throat:     Pharynx: Uvula midline. No oropharyngeal exudate.  Eyes:     Conjunctiva/sclera: Conjunctivae normal.  Neck:     Thyroid: No thyromegaly.     Trachea: Trachea normal. No tracheal tenderness or tracheal deviation.  Cardiovascular:     Rate and Rhythm: Tachycardia present. Rhythm irregular.     Heart sounds: S1 normal and S2 normal. No murmur heard.   Pulmonary:     Effort: No respiratory distress.     Breath sounds: Normal breath sounds. No stridor. No wheezing (Minimal basilar inspiratory crackles posterior lung fields bilaterally) or rales.  Lymphadenopathy:     Head:     Right side of head: No tonsillar adenopathy.     Left side of head: No tonsillar adenopathy.     Cervical: No cervical adenopathy.  Skin:    Findings: No erythema or rash.     Nails: There is no clubbing.  Neurological:     Mental Status: He is alert.     Diagnostics:    Spirometry was performed and demonstrated an FEV1 of 1.48 at 65 % of predicted.   Assessment and Plan:   1. Asthma, not well controlled, severe persistent, with acute exacerbation   2. Other allergic rhinitis   3. LPRD (laryngopharyngeal reflux disease)   4. Tachycardia     1. Continue Advair 250 one inhalation 1-2 times per day depending on disease activity  2. Continue Singulair 10 mg daily    3. Continue to Treat reflux:   A. Omeprazole 40mg  2 times per day (replaces Nexium)  B. Famotidine 40 mg in PM  4.  Continue OTC Nasacort one spray each nostril 1-2 times per day during periods of upper airway symptoms.    5.  Nasal saline, Mucinex DM, ibuprofen, Claritin, and ProAir HFA if needed  6. For this recent episode:   A. Depomedrol 80 IM delivered in clinic today   B. Visit with ER, UC, or Dr. Alroy Dust today about rapid heart rate.  7. Further evaluation for possible atrial fibrillation  and TIA???  8.  Return to clinic in 12 months or earlier if problem  I have addressed Tony Roberts's inflammatory component giving rise to some of his respiratory tract symptoms but I think he needs further evaluation for possible atrial fibrillation with rapid ventricular rate giving rise to some of his symptoms as well.  I have informed him that he needs to go to the urgent care, or emergency room, or see Dr. Alroy Dust today regarding this issue.  He will obviously require a EKG and a chest x-ray and certainly  other therapy if he is in atrial fibrillation.  It is quite possible that he is having short-term atrial fibrillation intermittently which is giving rise to his 3 episodes of apparent TIAs over the course of the past month or so.  [name], MD Allergy / Immunology [place]

## 2020-08-19 NOTE — Addendum Note (Signed)
Addended by: Farrel Demark R on: 08/19/2020 04:05 PM   Modules accepted: Orders

## 2020-08-20 MED FILL — FAMOTIDINE 40 MG TABLET: 40 | 30 days supply | Qty: 30 | Fill #3

## 2020-08-21 ENCOUNTER — Other Ambulatory Visit: Payer: Self-pay

## 2020-08-21 ENCOUNTER — Encounter (HOSPITAL_COMMUNITY): Payer: Self-pay | Admitting: Internal Medicine

## 2020-08-21 ENCOUNTER — Inpatient Hospital Stay (HOSPITAL_COMMUNITY)
Admission: EM | Admit: 2020-08-21 | Discharge: 2020-08-28 | DRG: 308 | Disposition: A | Discharge status: Home / Self Care | Payer: Medicare Other | Attending: Internal Medicine | Admitting: Internal Medicine

## 2020-08-21 ENCOUNTER — Ambulatory Visit (HOSPITAL_COMMUNITY)
Admission: EM | Admit: 2020-08-21 | Discharge: 2020-08-21 | Disposition: A | Discharge status: Other Acute Inpatient Hospital | Payer: Medicare Other | Attending: Family Medicine | Admitting: Family Medicine

## 2020-08-21 ENCOUNTER — Emergency Department (HOSPITAL_COMMUNITY): Payer: Medicare Other

## 2020-08-21 DIAGNOSIS — Z7951 Long term (current) use of inhaled steroids: Secondary | ICD-10-CM

## 2020-08-21 DIAGNOSIS — Z20822 Contact with and (suspected) exposure to covid-19: Secondary | ICD-10-CM | POA: Diagnosis present

## 2020-08-21 DIAGNOSIS — I351 Nonrheumatic aortic (valve) insufficiency: Secondary | ICD-10-CM | POA: Diagnosis not present

## 2020-08-21 DIAGNOSIS — I083 Combined rheumatic disorders of mitral, aortic and tricuspid valves: Secondary | ICD-10-CM | POA: Diagnosis present

## 2020-08-21 DIAGNOSIS — I361 Nonrheumatic tricuspid (valve) insufficiency: Secondary | ICD-10-CM | POA: Diagnosis not present

## 2020-08-21 DIAGNOSIS — E876 Hypokalemia: Secondary | ICD-10-CM | POA: Diagnosis present

## 2020-08-21 DIAGNOSIS — I4891 Unspecified atrial fibrillation: Principal | ICD-10-CM

## 2020-08-21 DIAGNOSIS — Z79899 Other long term (current) drug therapy: Secondary | ICD-10-CM | POA: Diagnosis not present

## 2020-08-21 DIAGNOSIS — S4292XA Fracture of left shoulder girdle, part unspecified, initial encounter for closed fracture: Secondary | ICD-10-CM | POA: Diagnosis not present

## 2020-08-21 DIAGNOSIS — Z820 Family history of epilepsy and other diseases of the nervous system: Secondary | ICD-10-CM | POA: Diagnosis not present

## 2020-08-21 DIAGNOSIS — D509 Iron deficiency anemia, unspecified: Secondary | ICD-10-CM | POA: Diagnosis present

## 2020-08-21 DIAGNOSIS — W06XXXA Fall from bed, initial encounter: Secondary | ICD-10-CM | POA: Diagnosis not present

## 2020-08-21 DIAGNOSIS — I517 Cardiomegaly: Secondary | ICD-10-CM | POA: Diagnosis not present

## 2020-08-21 DIAGNOSIS — Z6829 Body mass index (BMI) 29.0-29.9, adult: Secondary | ICD-10-CM | POA: Diagnosis not present

## 2020-08-21 DIAGNOSIS — Z8673 Personal history of transient ischemic attack (TIA), and cerebral infarction without residual deficits: Secondary | ICD-10-CM

## 2020-08-21 DIAGNOSIS — I472 Ventricular tachycardia: Secondary | ICD-10-CM | POA: Diagnosis present

## 2020-08-21 DIAGNOSIS — I493 Ventricular premature depolarization: Secondary | ICD-10-CM | POA: Diagnosis present

## 2020-08-21 DIAGNOSIS — J4520 Mild intermittent asthma, uncomplicated: Secondary | ICD-10-CM | POA: Diagnosis present

## 2020-08-21 DIAGNOSIS — D508 Other iron deficiency anemias: Secondary | ICD-10-CM | POA: Diagnosis not present

## 2020-08-21 DIAGNOSIS — Z825 Family history of asthma and other chronic lower respiratory diseases: Secondary | ICD-10-CM

## 2020-08-21 DIAGNOSIS — I5021 Acute systolic (congestive) heart failure: Secondary | ICD-10-CM | POA: Diagnosis present

## 2020-08-21 DIAGNOSIS — Y92230 Patient room in hospital as the place of occurrence of the external cause: Secondary | ICD-10-CM | POA: Diagnosis not present

## 2020-08-21 DIAGNOSIS — E669 Obesity, unspecified: Secondary | ICD-10-CM | POA: Diagnosis present

## 2020-08-21 DIAGNOSIS — S43035A Inferior dislocation of left humerus, initial encounter: Secondary | ICD-10-CM | POA: Diagnosis not present

## 2020-08-21 DIAGNOSIS — I429 Cardiomyopathy, unspecified: Secondary | ICD-10-CM | POA: Diagnosis present

## 2020-08-21 DIAGNOSIS — I77810 Thoracic aortic ectasia: Secondary | ICD-10-CM | POA: Diagnosis present

## 2020-08-21 DIAGNOSIS — D649 Anemia, unspecified: Secondary | ICD-10-CM | POA: Diagnosis not present

## 2020-08-21 DIAGNOSIS — I110 Hypertensive heart disease with heart failure: Secondary | ICD-10-CM | POA: Diagnosis present

## 2020-08-21 DIAGNOSIS — S4292XS Fracture of left shoulder girdle, part unspecified, sequela: Secondary | ICD-10-CM | POA: Diagnosis not present

## 2020-08-21 DIAGNOSIS — R0602 Shortness of breath: Secondary | ICD-10-CM

## 2020-08-21 DIAGNOSIS — I509 Heart failure, unspecified: Secondary | ICD-10-CM | POA: Diagnosis not present

## 2020-08-21 DIAGNOSIS — I10 Essential (primary) hypertension: Secondary | ICD-10-CM | POA: Diagnosis not present

## 2020-08-21 DIAGNOSIS — W19XXXS Unspecified fall, sequela: Secondary | ICD-10-CM | POA: Diagnosis not present

## 2020-08-21 DIAGNOSIS — I420 Dilated cardiomyopathy: Secondary | ICD-10-CM | POA: Diagnosis not present

## 2020-08-21 DIAGNOSIS — I340 Nonrheumatic mitral (valve) insufficiency: Secondary | ICD-10-CM | POA: Diagnosis not present

## 2020-08-21 DIAGNOSIS — K219 Gastro-esophageal reflux disease without esophagitis: Secondary | ICD-10-CM | POA: Diagnosis present

## 2020-08-21 DIAGNOSIS — I428 Other cardiomyopathies: Secondary | ICD-10-CM | POA: Diagnosis not present

## 2020-08-21 DIAGNOSIS — W19XXXA Unspecified fall, initial encounter: Secondary | ICD-10-CM

## 2020-08-21 DIAGNOSIS — I350 Nonrheumatic aortic (valve) stenosis: Secondary | ICD-10-CM | POA: Diagnosis not present

## 2020-08-21 DIAGNOSIS — Z419 Encounter for procedure for purposes other than remedying health state, unspecified: Secondary | ICD-10-CM

## 2020-08-21 LAB — PHOSPHORUS: Phosphorus: 3.3 mg/dL (ref 2.5–4.6)

## 2020-08-21 LAB — MAGNESIUM: Magnesium: 2.1 mg/dL (ref 1.7–2.4)

## 2020-08-21 LAB — IRON AND TIBC
Iron: 23 ug/dL — ABNORMAL LOW (ref 45–182)
Saturation Ratios: 6 % — ABNORMAL LOW (ref 17.9–39.5)
TIBC: 416 ug/dL (ref 250–450)
UIBC: 393 ug/dL

## 2020-08-21 LAB — TROPONIN I (HIGH SENSITIVITY)
Troponin I (High Sensitivity): 10 ng/L (ref <18)
Troponin I (High Sensitivity): 10 ng/L (ref <18)

## 2020-08-21 LAB — HEMOGLOBIN A1C
Hgb A1c MFr Bld: 5.8 % — ABNORMAL HIGH (ref 4.8–5.6)
Mean Plasma Glucose: 119.76 mg/dL

## 2020-08-21 LAB — CBC WITH DIFFERENTIAL/PLATELET
Abs Immature Granulocytes: 0.03 10*3/uL (ref 0.00–0.07)
Basophils Absolute: 0.1 10*3/uL (ref 0.0–0.1)
Basophils Relative: 1 %
Eosinophils Absolute: 0.2 10*3/uL (ref 0.0–0.5)
Eosinophils Relative: 3 %
HCT: 38.1 % — ABNORMAL LOW (ref 39.0–52.0)
Hemoglobin: 11.6 g/dL — ABNORMAL LOW (ref 13.0–17.0)
Immature Granulocytes: 0 %
Lymphocytes Relative: 13 %
Lymphs Abs: 1 10*3/uL (ref 0.7–4.0)
MCH: 24.5 pg — ABNORMAL LOW (ref 26.0–34.0)
MCHC: 30.4 g/dL (ref 30.0–36.0)
MCV: 80.4 fL (ref 80.0–100.0)
Monocytes Absolute: 0.6 10*3/uL (ref 0.1–1.0)
Monocytes Relative: 9 %
Neutro Abs: 5.3 10*3/uL (ref 1.7–7.7)
Neutrophils Relative %: 74 %
Platelets: 152 10*3/uL (ref 150–400)
RBC: 4.74 MIL/uL (ref 4.22–5.81)
RDW: 16.2 % — ABNORMAL HIGH (ref 11.5–15.5)
WBC: 7.2 10*3/uL (ref 4.0–10.5)
nRBC: 0 % (ref 0.0–0.2)

## 2020-08-21 LAB — LIPID PANEL
Cholesterol: 187 mg/dL (ref 0–200)
HDL: 37 mg/dL — ABNORMAL LOW (ref >40)
LDL Cholesterol: 137 mg/dL — ABNORMAL HIGH (ref 0–99)
Total CHOL/HDL Ratio: 5.1 RATIO
Triglycerides: 64 mg/dL (ref <150)
VLDL: 13 mg/dL (ref 0–40)

## 2020-08-21 LAB — COMPREHENSIVE METABOLIC PANEL
ALT: 20 U/L (ref 0–44)
AST: 26 U/L (ref 15–41)
Albumin: 3.5 g/dL (ref 3.5–5.0)
Alkaline Phosphatase: 64 U/L (ref 38–126)
Anion gap: 13 (ref 5–15)
BUN: 10 mg/dL (ref 8–23)
CO2: 21 mmol/L — ABNORMAL LOW (ref 22–32)
Calcium: 8.8 mg/dL — ABNORMAL LOW (ref 8.9–10.3)
Chloride: 105 mmol/L (ref 98–111)
Creatinine, Ser: 1.1 mg/dL (ref 0.61–1.24)
GFR, Estimated: >60 mL/min (ref >60)
Glucose, Bld: 135 mg/dL — ABNORMAL HIGH (ref 70–99)
Potassium: 3.4 mmol/L — ABNORMAL LOW (ref 3.5–5.1)
Sodium: 139 mmol/L (ref 135–145)
Total Bilirubin: 1.4 mg/dL — ABNORMAL HIGH (ref 0.3–1.2)
Total Protein: 6.3 g/dL — ABNORMAL LOW (ref 6.5–8.1)

## 2020-08-21 LAB — RESP PANEL BY RT-PCR (FLU A&B, COVID) ARPGX2
Influenza A by PCR: NEGATIVE
Influenza B by PCR: NEGATIVE
SARS Coronavirus 2 by RT PCR: NEGATIVE

## 2020-08-21 LAB — TSH: TSH: 4.301 u[IU]/mL (ref 0.350–4.500)

## 2020-08-21 LAB — FERRITIN: Ferritin: 8 ng/mL — ABNORMAL LOW (ref 24–336)

## 2020-08-21 LAB — BRAIN NATRIURETIC PEPTIDE: B Natriuretic Peptide: 328.4 pg/mL — ABNORMAL HIGH (ref 0.0–100.0)

## 2020-08-21 IMAGING — DX DG CHEST 1V PORT
1 series · 1 of 1 positions shown · non-contrast
Comparison: [DATE]

CLINICAL DATA: Shortness of breath and palpitation

EXAM:
PORTABLE CHEST 1 VIEW

[chest ap]
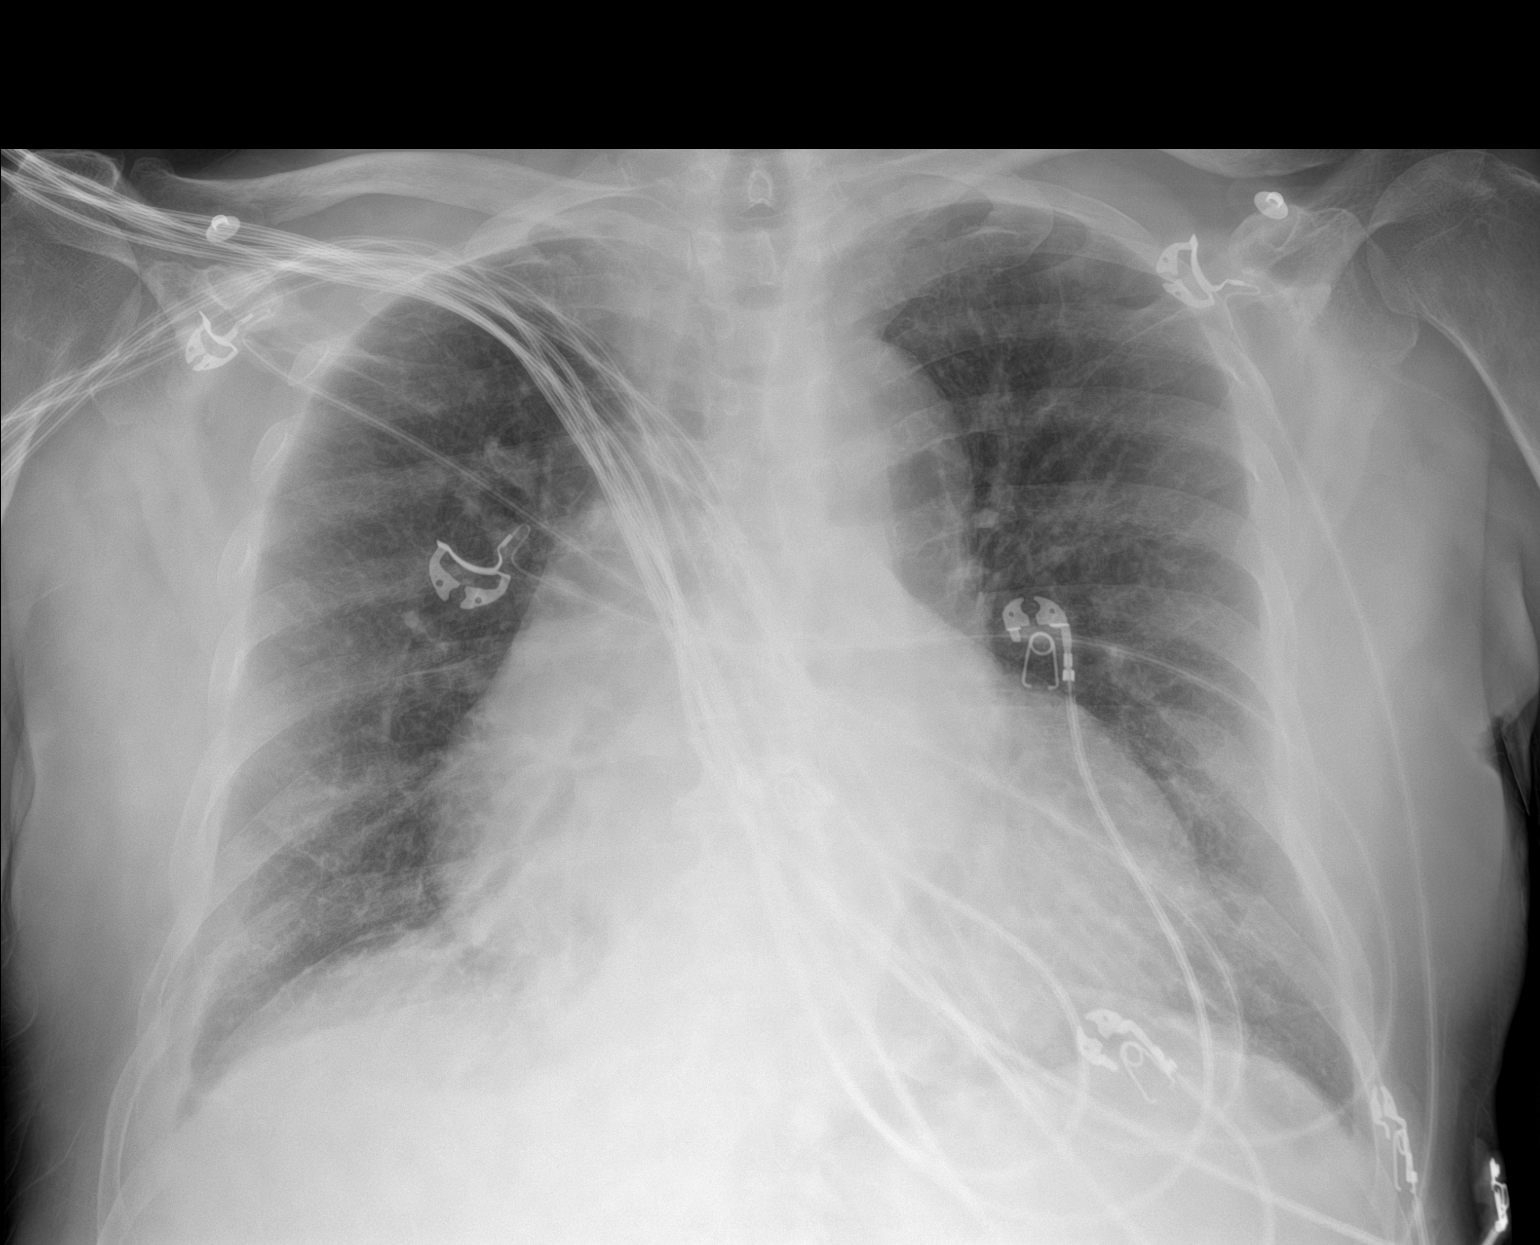

[1 of 1 positions shown; findings below may reference images not displayed]

FINDINGS: Cardiomegaly and aortic tortuosity. Cardiomegaly appears
new/progressed. Suspect small right pleural effusion. Mild
interstitial prominence that is similar to prior. No pneumothorax.
IMPRESSION: 1. Cardiopericardial enlargement.
2. Small right pleural effusion.

## 2020-08-21 MED ORDER — FAMOTIDINE 20 MG PO TABS
40.0000 mg | ORAL_TABLET | Freq: Every day | ORAL | Status: DC
  Administered 2020-08-21 – 2020-08-27 (×7): 40 mg via ORAL
  Filled 2020-08-21 (×7): qty 2

## 2020-08-21 MED ORDER — FUROSEMIDE 10 MG/ML IJ SOLN
40.0000 mg | Freq: Once | INTRAMUSCULAR | Status: AC
  Administered 2020-08-21: 40 mg via INTRAVENOUS
  Filled 2020-08-21: qty 4

## 2020-08-21 MED ORDER — SODIUM CHLORIDE 0.9% FLUSH
3.0000 mL | Freq: Two times a day (BID) | INTRAVENOUS | Status: DC
  Administered 2020-08-21 – 2020-08-28 (×13): 3 mL via INTRAVENOUS

## 2020-08-21 MED ORDER — METOPROLOL TARTRATE 25 MG PO TABS
25.0000 mg | ORAL_TABLET | Freq: Two times a day (BID) | ORAL | Status: DC
Start: 2020-08-21 — End: 2020-08-21

## 2020-08-21 MED ORDER — POTASSIUM CHLORIDE CRYS ER 20 MEQ PO TBCR
40.0000 meq | EXTENDED_RELEASE_TABLET | Freq: Once | ORAL | Status: AC
  Administered 2020-08-21: 40 meq via ORAL
  Filled 2020-08-21: qty 2

## 2020-08-21 MED ORDER — ACETAMINOPHEN 325 MG PO TABS
650.0000 mg | ORAL_TABLET | ORAL | Status: DC | PRN
  Administered 2020-08-22: 650 mg via ORAL
  Filled 2020-08-21: qty 2

## 2020-08-21 MED ORDER — SODIUM CHLORIDE 0.9 % IV SOLN
250.0000 mL | INTRAVENOUS | Status: DC | PRN
  Administered 2020-08-27: 250 mL via INTRAVENOUS

## 2020-08-21 MED ORDER — SODIUM CHLORIDE 0.9% FLUSH: 3.0000 mL | INTRAVENOUS | Status: DC | PRN

## 2020-08-21 MED ORDER — IPRATROPIUM BROMIDE HFA 17 MCG/ACT IN AERS
2.0000 | INHALATION_SPRAY | RESPIRATORY_TRACT | Status: DC
  Administered 2020-08-21 – 2020-08-22 (×5): 2 via RESPIRATORY_TRACT
  Filled 2020-08-21: qty 12.9

## 2020-08-21 MED ORDER — TRIAMCINOLONE ACETONIDE 55 MCG/ACT NA AERO
1.0000 | INHALATION_SPRAY | Freq: Every day | NASAL | Status: DC | PRN
  Filled 2020-08-21: qty 10.8

## 2020-08-21 MED ORDER — DILTIAZEM HCL-DEXTROSE 125-5 MG/125ML-% IV SOLN (PREMIX)
5.0000 mg/h | INTRAVENOUS | Status: DC
  Administered 2020-08-21: 5 mg/h via INTRAVENOUS
  Administered 2020-08-21 – 2020-08-22 (×3): 15 mg/h via INTRAVENOUS
  Filled 2020-08-21 (×6): qty 125

## 2020-08-21 MED ORDER — PANTOPRAZOLE SODIUM 40 MG PO TBEC
40.0000 mg | DELAYED_RELEASE_TABLET | Freq: Every day | ORAL | Status: DC
  Administered 2020-08-22 – 2020-08-28 (×6): 40 mg via ORAL
  Filled 2020-08-21 (×6): qty 1

## 2020-08-21 MED ORDER — ENOXAPARIN SODIUM 40 MG/0.4ML ~~LOC~~ SOLN: 40.0000 mg | SUBCUTANEOUS | Status: DC

## 2020-08-21 MED ORDER — METOPROLOL TARTRATE 25 MG PO TABS
25.0000 mg | ORAL_TABLET | Freq: Two times a day (BID) | ORAL | Status: DC
  Administered 2020-08-21: 25 mg via ORAL
  Filled 2020-08-21 (×2): qty 1

## 2020-08-21 MED ORDER — MONTELUKAST SODIUM 10 MG PO TABS
10.0000 mg | ORAL_TABLET | Freq: Every day | ORAL | Status: DC
  Administered 2020-08-21 – 2020-08-27 (×7): 10 mg via ORAL
  Filled 2020-08-21 (×8): qty 1

## 2020-08-21 MED ORDER — ONDANSETRON HCL 4 MG/2ML IJ SOLN
4.0000 mg | Freq: Four times a day (QID) | INTRAMUSCULAR | Status: DC | PRN
  Administered 2020-08-24: 4 mg via INTRAVENOUS
  Filled 2020-08-21: qty 2

## 2020-08-21 MED ORDER — ASPIRIN EC 81 MG PO TBEC
81.0000 mg | DELAYED_RELEASE_TABLET | Freq: Every day | ORAL | Status: DC
  Administered 2020-08-21 – 2020-08-22 (×2): 81 mg via ORAL
  Filled 2020-08-21 (×2): qty 1

## 2020-08-21 MED ORDER — HEPARIN (PORCINE) 25000 UT/250ML-% IV SOLN
1200.0000 [IU]/h | INTRAVENOUS | Status: DC
  Administered 2020-08-21: 1000 [IU]/h via INTRAVENOUS
  Administered 2020-08-22: 1200 [IU]/h via INTRAVENOUS
  Filled 2020-08-21 (×2): qty 250

## 2020-08-21 MED ORDER — DILTIAZEM LOAD VIA INFUSION
10.0000 mg | Freq: Once | INTRAVENOUS | Status: AC
  Administered 2020-08-21: 10 mg via INTRAVENOUS
  Filled 2020-08-21: qty 10

## 2020-08-21 MED ORDER — FLUTICASONE FUROATE-VILANTEROL 200-25 MCG/INH IN AEPB
1.0000 | INHALATION_SPRAY | Freq: Every day | RESPIRATORY_TRACT | Status: DC
  Administered 2020-08-21 – 2020-08-28 (×6): 1 via RESPIRATORY_TRACT
  Filled 2020-08-21: qty 28

## 2020-08-21 NOTE — ED Triage Notes (Signed)
Pt sent from  [place] office  Because of HR 130

## 2020-08-21 NOTE — ED Triage Notes (Signed)
BIB EMS. Pt been lethargic, congestion and wheezing so he went to his allergist. He was referred to his for high HR but place was busy so he went to UC this morning. Pt came with afib RVR 150s. No chest pai. Pt is lethargic and SOB. No history of any condition or afib.

## 2020-08-21 NOTE — Consult Note (Addendum)
Cardiology Consultation:   Patient ID: Tony Roberts MRN: [id number]; DOB: [birth year]  Admit date: 08/21/2020 Date of Consult: 08/21/2020  Primary Care Provider: Alroy Dust, L.Marlou Sa, MD [place] Cardiologist: No primary care provider on file. [facility] Electrophysiologist:  None    Patient Profile:   Tony Roberts is a 79 y.o. male with a hx of HTN, GERD, asthma who is being seen today for the evaluation of atrial fib with RVR at the request of Dr. Roosevelt Locks.   History of Present Illness:   Tony Roberts with no prior cardiac hx presented to ER today with palpitations DOE, and increase of cough.  He was found in a fib 3 days ago. HR was 130 at [place] office. No chest pain. He was asked to go to ER or see PCP. Pt came today. He states he started to feel bad on Thursday.   He has had recent TIAs from 06/2020; 07/29/20; 08/02/20 each last about 25 min.  MRI of brain without acute abnormality per allergist note.  In ER HR 130-150s .    No chest pain mostly dyspneic with heavy exertion -climbing 2 steps, but not so much walking around the house.  Last evening some mild SOB lying flat to sleep.  CXR cardiopericardial enlargement and aortic tortuosity.  Suspect small Rt pl effusion.   He was placed on IV dilt and 10 mg bolus.  HR not controlled.  Pt does have hx of anemia in the past. And on iron.  No bleeding now.  He has been placed on ASA.  Several years ago he tells me his hgb got to 5 but he was unaware.   EKG:  The EKG was personally reviewed and demonstrates:  Atrial fib with RVR 153 non specific T wave abnormality  Rare PVC Telemetry:  Telemetry was personally reviewed and demonstrates:  A fib with RVR  BNP 328, Na 139, K+ 3.4 BUN 10, Cr 1.10 K+ 3.4  Hs troponin 10 and 10  hgb 11.6, WBC 7.2 plts 152 TSH 4.301 Covid panel pending.   Past Medical History:  Diagnosis Date  . Arthritis   . Asthma    last asthma attack at age 51  . GERD (gastroesophageal reflux disease)    . Headache   . Other specified iron deficiency anemias   . PAC (premature atrial contraction)    occasional PAC's    Past Surgical History:  Procedure Laterality Date  . CATARACT EXTRACTION    . HERNIA REPAIR    . SPINE SURGERY  10/29/2016  . TONSILLECTOMY       Home Medications:  Prior to Admission medications   Medication Sig Start Date End Date Taking? Authorizing Provider  ADVAIR DISKUS 250-50 MCG/DOSE AEPB INHALE 1 PUFF BY MOUTH ONCE TO TWICE A DAY TO PREVENT COUGH OR WHEEZE. RINSE, GARGLE, AND SPIT AFTER USE. Patient taking differently: Inhale 1 puff into the lungs See admin instructions. Inhale 1 puff into the lungs at bedtime and an additional 1 puff in the morning as needed for coughing, wheezing, or shortness of breath 04/01/20  Yes Kozlow, Donnamarie Poag, MD  albuterol (PROVENTIL HFA) 108 (90 Base) MCG/ACT inhaler Inhale two puffs every four to six hours as needed for cough or wheeze. Patient taking differently: Inhale 2 puffs into the lungs See admin instructions. Inhale 2 puffs into the lungs every four to six hours as needed for coughing or wheezing 05/19/16  Yes Kozlow, Donnamarie Poag, MD  amLODipine (NORVASC) 5 MG tablet Take  5 mg by mouth at bedtime.  12/18/15  Yes [provider]  esomeprazole (NEXIUM) 40 MG capsule Take 40 mg by mouth See admin instructions. Take 40 mg by mouth in the morning before breakfast and an additional 40 mg once a day as needed for recurring heartburn   Yes [provider]  famotidine (PEPCID) 40 MG tablet Take 40 mg by mouth at bedtime.  03/17/19  Yes [provider]  montelukast (SINGULAIR) 10 MG tablet TAKE 1 TABLET (10 MG TOTAL) BY MOUTH AT BEDTIME. 04/23/20  Yes Kozlow, Donnamarie Poag, MD  triamcinolone (NASACORT ALLERGY 24HR) 55 MCG/ACT AERO nasal inhaler Place 1 spray into the nose daily as needed (for allergies or rhinitis).    Yes [provider]  omeprazole (PRILOSEC) 40 MG capsule Take 1 capsule (40 mg total) by mouth in the  morning and at bedtime. Patient not taking: Reported on 08/21/2020 01/23/20   Jiles Prows, MD    Inpatient Medications: Scheduled Meds: . aspirin EC  81 mg Oral Daily  . enoxaparin (LOVENOX) injection  40 mg Subcutaneous Q24H  . famotidine  40 mg Oral QHS  . fluticasone furoate-vilanterol  1 puff Inhalation Daily  . ipratropium  2 puff Inhalation Q4H  . montelukast  10 mg Oral QHS  . [START ON 08/22/2020] pantoprazole  40 mg Oral Daily  . potassium chloride  40 mEq Oral Once  . sodium chloride flush  3 mL Intravenous Q12H   Continuous Infusions: . sodium chloride    . diltiazem (CARDIZEM) infusion 15 mg/hr (08/21/20 1300)   PRN Meds: sodium chloride, acetaminophen, ondansetron (ZOFRAN) IV, sodium chloride flush, triamcinolone  Allergies:   No Known Allergies  Social History:   Social History   Socioeconomic History  . Marital status: Married    Spouse name: Not on file  . Number of children: Not on file  . Years of education: Not on file  . Highest education level: Not on file  Occupational History  . Not on file  Tobacco Use  . Smoking status: Never Smoker  . Smokeless tobacco: Never Used  Substance and Sexual Activity  . Alcohol use: No  . Drug use: No  . Sexual activity: Not on file  Other Topics Concern  . Not on file  Social History Narrative  . Not on file   Social Determinants of Health   Financial Resource Strain:   . Difficulty of Paying Living Expenses: Not on file  Food Insecurity:   . Worried About Charity fundraiser in the Last Year: Not on file  . Ran Out of Food in the Last Year: Not on file  Transportation Needs:   . Lack of Transportation (Medical): Not on file  . Lack of Transportation (Non-Medical): Not on file  Physical Activity:   . Days of Exercise per Week: Not on file  . Minutes of Exercise per Session: Not on file  Stress:   . Feeling of Stress : Not on file  Social Connections:   . Frequency of Communication with Friends and  Family: Not on file  . Frequency of Social Gatherings with Friends and Family: Not on file  . Attends Religious Services: Not on file  . Active Member of Clubs or Organizations: Not on file  . Attends Archivist Meetings: Not on file  . Marital Status: Not on file  Intimate Partner Violence:   . Fear of Current or Ex-Partner: Not on file  . Emotionally Abused: Not on  file  . Physically Abused: Not on file  . Sexually Abused: Not on file    Family History:    Family History  Problem Relation Age of Onset  . Alzheimer's disease Mother   . Asthma Father      ROS:  Please see the history of present illness.  General:no colds or fevers, no weight changes Skin:no rashes or ulcers HEENT:no blurred vision, no congestion CV:see HPI PUL:see HPI GI:no diarrhea constipation or melena, no indigestion GU:no hematuria, no dysuria MS:no joint pain, no claudication Neuro:no syncope, no lightheadedness Endo:no diabetes, no thyroid disease  All other ROS reviewed and negative.     Physical Exam/Data:   Vitals:   08/21/20 1330 08/21/20 1345 08/21/20 1430 08/21/20 1500  BP: (!) 116/93 (!) 139/106 (!) 118/98 (!) 128/97  Pulse: (!) 134 (!) 133 (!) 129 64  Resp: 16 13 15 15   Temp:      TempSrc:      SpO2: 93% 97% 97% 97%   No intake or output data in the 24 hours ending 08/21/20 1549 Last 3 Weights 08/19/2020 01/23/2020 10/23/2016  Weight (lbs) 198 lb 190 lb 6.4 oz 197 lb 4.8 oz  Weight (kg) 89.812 kg 86.365 kg 89.495 kg     There is no height or weight on file to calculate BMI.  General:  Well nourished, well developed, in no acute distress, appears younger than stated age. HEENT: normal Lymph: no adenopathy Neck: + JVD Endocrine:  No thryomegaly Vascular: No carotid bruits; pedal pulses 1+ bilaterally  Cardiac:  irreg irreg; 1-4/4 systolic murmur no gallup or rub Lungs:  Few rales to auscultation bilaterally, no wheezing, rhonchi  Abd: soft, nontender, no hepatomegaly   Ext: no edema Musculoskeletal:  No deformities, BUE and BLE strength normal and equal Skin: warm and dry  Neuro:  Alert and oreineted X 3 MAE follows commands, no focal abnormalities noted Psych:  Normal affect    Relevant CV Studies: Echo pending  Laboratory Data:  High Sensitivity Troponin:   Recent Labs  Lab 08/21/20 1105 08/21/20 1343  TROPONINIHS 10 10     Chemistry Recent Labs  Lab 08/21/20 1105  NA 139  K 3.4*  CL 105  CO2 21*  GLUCOSE 135*  BUN 10  CREATININE 1.10  CALCIUM 8.8*  GFRNONAA >60  ANIONGAP 13    Recent Labs  Lab 08/21/20 1105  PROT 6.3*  ALBUMIN 3.5  AST 26  ALT 20  ALKPHOS 64  BILITOT 1.4*   Hematology Recent Labs  Lab 08/21/20 1105  WBC 7.2  RBC 4.74  HGB 11.6*  HCT 38.1*  MCV 80.4  MCH 24.5*  MCHC 30.4  RDW 16.2*  PLT 152   BNP Recent Labs  Lab 08/21/20 1127  BNP 328.4*    DDimer No results for input(s): DDIMER in the last 168 hours.   Radiology/Studies:  DG Chest Port 1 View  Result Date: 08/21/2020 CLINICAL DATA:  Shortness of breath and palpitation EXAM: PORTABLE CHEST 1 VIEW COMPARISON:  10/23/2016 FINDINGS: Cardiomegaly and aortic tortuosity. Cardiomegaly appears new/progressed. Suspect small right pleural effusion. Mild interstitial prominence that is similar to prior. No pneumothorax. IMPRESSION: 1. Cardiopericardial enlargement. 2. Small right pleural effusion. Electronically Signed   By: Monte Fantasia M.D.   On: 08/21/2020 11:37     Assessment and Plan:   1. Atrial fib RVR symptomatic with DOE --concern with recent TIAs - though has been anemic in the past with presentation will anticoagulate with recent TIAs and  a fib may have been in and out since Oct.   Rate not controlled, BP is stable may try IV BB , hesitant to DCCV with at least 3 days of a fib and unsure how long he has been in may be a moth or more. unless TEE DCCV is done .   No chest pain and neg troponin. Will add BB 2. Cardiomegaly on CXR  concern for cardiomyopathy with rapid HR.  Check echo-  mild CHF may need one dose of lasix  40 mg  after K+ replacement.  3. Recent TIAs, resolved after 25-30 min.  MRI of head was neg 08/05/20 4. Hx asthma 5. Hx of iron def anemia check anemia panel and place on anticoag if Dr. Radford Pax agrees. Check stools for heme 6. HTN controlled on amlodipine at home        New York Heart Association (NYHA) Functional Class NYHA Class II   CHA2DS2-VASc Score = 5  This indicates a 7.2% annual risk of stroke. The patient's score is based upon: CHF History: 0 HTN History: 1 Diabetes History: 0 Stroke History: 2 Vascular Disease History: 0 Age Score: 2 Gender Score: 0         For questions or updates, please contact [place] Please consult [email or web address] for contact info under    Signed, Cecilie Kicks, NP  08/21/2020 3:49 PM

## 2020-08-21 NOTE — ED Provider Notes (Signed)
[facility] EMERGENCY DEPARTMENT Provider Note   CSN: [id number] Arrival date & time: 08/21/20  1055     History No chief complaint on file.   Tony Roberts is a 79 y.o. male.  Patient is a 79 year old male with history of GERD, asthma, arthritis.  He presents today for evaluation of weakness and shortness of breath.  This has been worsening over the past 2 weeks.  Patient describes feeling dyspneic with climbing stairs.  He was seen by his primary doctor earlier this week and diagnosed with atrial fibrillation.  He was discharged, now presents with ongoing symptoms.  He denies to me he is having any chest pain.  He has no prior history of cardiac issues.  The history is provided by the patient.       Past Medical History:  Diagnosis Date  . Arthritis   . Asthma    last asthma attack at age 87  . GERD (gastroesophageal reflux disease)   . Headache   . Other specified iron deficiency anemias   . PAC (premature atrial contraction)    occasional PAC's    Patient Active Problem List   Diagnosis Date Noted  . A-fib ([place]) 08/21/2020  . Radiculopathy 10/29/2016  . Left fibular fracture 10/27/2013  . Sprain and strain of unspecified site of hip and thigh 05/26/2012  . Other specified iron deficiency anemias     Past Surgical History:  Procedure Laterality Date  . CATARACT EXTRACTION    . HERNIA REPAIR    . SPINE SURGERY  10/29/2016  . TONSILLECTOMY         Family History  Problem Relation Age of Onset  . Alzheimer's disease Mother   . Asthma Father     Social History   Tobacco Use  . Smoking status: Never Smoker  . Smokeless tobacco: Never Used  Substance Use Topics  . Alcohol use: No  . Drug use: No    Home Medications Prior to Admission medications   Medication Sig Start Date End Date Taking? Authorizing Provider  ADVAIR DISKUS 250-50 MCG/DOSE AEPB INHALE 1 PUFF BY MOUTH ONCE TO TWICE A DAY TO PREVENT COUGH OR WHEEZE. RINSE, GARGLE,  AND SPIT AFTER USE. 04/01/20   Kozlow, Donnamarie Poag, MD  albuterol (PROVENTIL HFA) 108 (90 Base) MCG/ACT inhaler Inhale two puffs every four to six hours as needed for cough or wheeze. 05/19/16   Kozlow, Donnamarie Poag, MD  amLODipine (NORVASC) 5 MG tablet Take 5 mg by mouth every other day.  12/18/15   [provider]  famotidine (PEPCID) 40 MG tablet  03/17/19   [provider]  montelukast (SINGULAIR) 10 MG tablet TAKE 1 TABLET (10 MG TOTAL) BY MOUTH AT BEDTIME. 04/23/20   Kozlow, Donnamarie Poag, MD  omeprazole (PRILOSEC) 40 MG capsule Take 1 capsule (40 mg total) by mouth in the morning and at bedtime. 01/23/20   Kozlow, Donnamarie Poag, MD  triamcinolone (NASACORT ALLERGY 24HR) 55 MCG/ACT AERO nasal inhaler Place 1 spray into the nose daily.    [provider]    Allergies    No known allergies  Review of Systems   Review of Systems  All other systems reviewed and are negative.   Physical Exam Updated Vital Signs BP (!) 116/93   Pulse (!) 134   Temp 98.1 F (36.7 C) (Oral)   Resp 16   SpO2 93%   Physical Exam Vitals and nursing note reviewed.  Constitutional:      General:  He is not in acute distress.    Appearance: He is well-developed. He is not diaphoretic.  HENT:     Head: Normocephalic and atraumatic.  Cardiovascular:     Rate and Rhythm: Tachycardia present. Rhythm irregular.     Heart sounds: No murmur heard.  No friction rub.  Pulmonary:     Effort: Pulmonary effort is normal. No respiratory distress.     Breath sounds: Normal breath sounds. No wheezing or rales.  Abdominal:     General: Bowel sounds are normal. There is no distension.     Palpations: Abdomen is soft.     Tenderness: There is no abdominal tenderness.  Musculoskeletal:        General: Normal range of motion.     Cervical back: Normal range of motion and neck supple.  Skin:    General: Skin is warm and dry.  Neurological:     General: No focal deficit present.     Mental Status: He is alert and  oriented to person, place, and time.     Coordination: Coordination normal.     ED Results / Procedures / Treatments   Labs (all labs ordered are listed, but only abnormal results are displayed) Labs Reviewed  COMPREHENSIVE METABOLIC PANEL - Abnormal; Notable for the following components:      Result Value   Potassium 3.4 (*)    CO2 21 (*)    Glucose, Bld 135 (*)    Calcium 8.8 (*)    Total Protein 6.3 (*)    Total Bilirubin 1.4 (*)    All other components within normal limits  BRAIN NATRIURETIC PEPTIDE - Abnormal; Notable for the following components:   B Natriuretic Peptide 328.4 (*)    All other components within normal limits  CBC WITH DIFFERENTIAL/PLATELET - Abnormal; Notable for the following components:   Hemoglobin 11.6 (*)    HCT 38.1 (*)    MCH 24.5 (*)    RDW 16.2 (*)    All other components within normal limits  RESP PANEL BY RT-PCR (FLU A&B, COVID) ARPGX2  TSH  TROPONIN I (HIGH SENSITIVITY)  TROPONIN I (HIGH SENSITIVITY)    EKG EKG Interpretation  Date/Time:  Wednesday August 21 2020 10:58:07 EST Ventricular Rate:  153 PR Interval:    QRS Duration: 87 QT Interval:  315 QTC Calculation: 503 R Axis:   -4 Text Interpretation: Atrial fibrillation with rapid V-rate Abnormal R-wave progression, early transition Borderline T abnormalities, anterior leads Confirmed by Veryl Speak [phone]) on 08/21/2020 1:50:15 PM   Radiology DG Chest Port 1 View  Result Date: 08/21/2020 CLINICAL DATA:  Shortness of breath and palpitation EXAM: PORTABLE CHEST 1 VIEW COMPARISON:  10/23/2016 FINDINGS: Cardiomegaly and aortic tortuosity. Cardiomegaly appears new/progressed. Suspect small right pleural effusion. Mild interstitial prominence that is similar to prior. No pneumothorax. IMPRESSION: 1. Cardiopericardial enlargement. 2. Small right pleural effusion. Electronically Signed   By: Monte Fantasia M.D.   On: 08/21/2020 11:37    Procedures Procedures (including critical  care time)  Medications Ordered in ED Medications  diltiazem (CARDIZEM) 1 mg/mL load via infusion 10 mg (10 mg Intravenous Bolus from Bag 08/21/20 1158)    And  diltiazem (CARDIZEM) 125 mg in dextrose 5% 125 mL (1 mg/mL) infusion (15 mg/hr Intravenous Rate/Dose Change 08/21/20 1300)    ED Course  I have reviewed the triage vital signs and the nursing notes.  Pertinent labs & imaging results that were available during my care of the patient were reviewed by  me and considered in my medical decision making (see chart for details).    MDM Rules/Calculators/A&P  Patient presenting with shortness of breath and new onset atrial fibrillation.  Initial heart rate in the 150s.  Patient started on a Cardizem drip with little effect on his tachycardia/A. fib.  Laboratory studies unremarkable.  I feels the patient will require admission for rate control, echocardiogram, and further observation.  I have spoken with Dr. Roosevelt Locks who agrees to admit.   CRITICAL CARE Performed by: Veryl Speak Total critical care time: 35 minutes Critical care time was exclusive of separately billable procedures and treating other patients. Critical care was necessary to treat or prevent imminent or life-threatening deterioration. Critical care was time spent personally by me on the following activities: development of treatment plan with patient and/or surrogate as well as nursing, discussions with consultants, evaluation of patient's response to treatment, examination of patient, obtaining history from patient or surrogate, ordering and performing treatments and interventions, ordering and review of laboratory studies, ordering and review of radiographic studies, pulse oximetry and re-evaluation of patient's condition.   Final Clinical Impression(s) / ED Diagnoses Final diagnoses:  None    Rx / DC Orders ED Discharge Orders    None       Veryl Speak, MD 08/21/20 1352

## 2020-08-21 NOTE — Progress Notes (Signed)
ANTICOAGULATION CONSULT NOTE - Initial Consult  Pharmacy Consult for IV Heparin Indication: atrial fibrillation and recent TIAs  No Known Allergies  Patient Measurements: Weight 89.8 kg (from 08/19/20) Hight 5'5 (from 08/19/20) Heparin Dosing Weight: 81 kg  Vital Signs: Temp: 98 F (36.7 C) (12/01 1608) Temp Source: Oral (12/01 1608) BP: 143/106 (12/01 1608) Pulse Rate: 131 (12/01 1608)  Labs: Recent Labs    08/21/20 1105 08/21/20 1343  HGB 11.6*  --   HCT 38.1*  --   PLT 152  --   CREATININE 1.10  --   TROPONINIHS 10 10    Estimated Creatinine Clearance: 56.1 mL/min (by C-G formula based on SCr of 1.1 mg/dL).   Medical History: Past Medical History:  Diagnosis Date  . Arthritis   . Asthma    last asthma attack at age 76  . GERD (gastroesophageal reflux disease)   . Headache   . Other specified iron deficiency anemias   . PAC (premature atrial contraction)    occasional PAC's    Medications:  Medications Prior to Admission  Medication Sig Dispense Refill Last Dose  . ADVAIR DISKUS 250-50 MCG/DOSE AEPB INHALE 1 PUFF BY MOUTH ONCE TO TWICE A DAY TO PREVENT COUGH OR WHEEZE. RINSE, GARGLE, AND SPIT AFTER USE. (Patient taking differently: Inhale 1 puff into the lungs See admin instructions. Inhale 1 puff into the lungs at bedtime and an additional 1 puff in the morning as needed for coughing, wheezing, or shortness of breath) 180 each 0 08/20/2020 at Unknown time  . albuterol (PROVENTIL HFA) 108 (90 Base) MCG/ACT inhaler Inhale two puffs every four to six hours as needed for cough or wheeze. (Patient taking differently: Inhale 2 puffs into the lungs See admin instructions. Inhale 2 puffs into the lungs every four to six hours as needed for coughing or wheezing) 1 Inhaler 1 unk  . amLODipine (NORVASC) 5 MG tablet Take 5 mg by mouth at bedtime.   11 08/20/2020 at pm  . esomeprazole (NEXIUM) 40 MG capsule Take 40 mg by mouth See admin instructions. Take 40 mg by mouth in  the morning before breakfast and an additional 40 mg once a day as needed for recurring heartburn   08/21/2020 at am  . famotidine (PEPCID) 40 MG tablet Take 40 mg by mouth at bedtime.    08/19/2020 at pm  . montelukast (SINGULAIR) 10 MG tablet TAKE 1 TABLET (10 MG TOTAL) BY MOUTH AT BEDTIME. 90 tablet 0 08/20/2020 at pm  . triamcinolone (NASACORT ALLERGY 24HR) 55 MCG/ACT AERO nasal inhaler Place 1 spray into the nose daily as needed (for allergies or rhinitis).    unk  . omeprazole (PRILOSEC) 40 MG capsule Take 1 capsule (40 mg total) by mouth in the morning and at bedtime. (Patient not taking: Reported on 08/21/2020) 60 capsule 5 Not Taking at Unknown time    Assessment: 79 years of age male with history of several TIAs in the last month and admitted with atrial fibrillation to start IV Heparin per pharmacy dosing. Hgb/Hct are stable from previous labs. Platelets are stable at 152. No bleeding reported. Patient was not on an anticoagulant prior to admission.   Goal of Therapy:  Heparin level 0.3 to 0.5 units/ml  - lower goal due to multiple recent TIAs Monitor platelets by anticoagulation protocol: Yes   Plan:  Start IV Heparin at 1000 units/hr (No bolus due to recent TIAs).  Heparin level to be checked in 8 hours.  Daily heparin level and  CBC while on therapy.   Sloan Leiter, PharmD, BCPS, BCCCP Clinical Pharmacist Please refer to [facility] for [place] numbers 08/21/2020,4:14 PM

## 2020-08-21 NOTE — TOC Benefit Eligibility Note (Signed)
Transition of Care [facility]) Benefit Eligibility Note    Patient Details  Name: Tony Roberts MRN: [id number] Date of Birth: November 29, 1940   Medication/Dose: Eliquis 2.5 and or 5mg .bid, Xarelto 15mg . and or 20mg  daily  30 day supply.  Covered?: Yes  Tier: 3 Drug  Prescription Coverage Preferred Pharmacy: [facility], [facility]  Spoke with Person/Company/Phone Number:: [place] [phone]  Co-Pay: &47.00  Prior Approval: No  Deductible: Unmet       Shelda Altes Phone Number: 08/21/2020, 4:37 PM

## 2020-08-21 NOTE — ED Provider Notes (Signed)
Tony Roberts   [id number] 08/21/20 Arrival Time: 3818  ASSESSMENT & PLAN:  1. New onset atrial fibrillation ([place])   2. SOB (shortness of breath)     Pt seen in triage. Reports on/off feelings of palpitations over the past month or two; more frequently recently. Associated SOB with exertion. No specific chest pain reported.  ECG: Performed today and interpreted by me: atrial fibrillation with RVR. Rate ranging 130-150s.  Also reports transient episodes of trouble speaking that may last 15-20 minutes. Three episodes over the past several weeks. No current symptoms.  Discussed diagnosis and need for ED evaluation. To ED via EMS. Stable upon discharge.   Reviewed expectations re: course of current medical issues. Questions answered. Outlined signs and symptoms indicating need for more acute intervention. Patient verbalized understanding. After Visit Summary given.   SUBJECTIVE:  History from: patient. Tony Roberts is a 79 y.o. male. See above for history obtained.  Social History   Tobacco Use  Smoking Status Never Smoker  Smokeless Tobacco Never Used   Social History   Substance and Sexual Activity  Alcohol Use No     OBJECTIVE:  Vitals:   08/21/20 1012  BP: (!) 134/99  Pulse: (!) 152  Resp: 18  TempSrc: Oral  SpO2: 98%    General appearance: alert, oriented, no acute distress Eyes: PERRLA; EOMI; conjunctivae normal HENT: normocephalic; atraumatic Neck: supple with FROM Lungs: without labored respirations; speaks full sentences without difficulty; CTAB Heart: tachycardic; irregularly irregular Chest Wall: without tenderness to palpation Abdomen: soft, non-tender; no guarding or rebound tenderness Extremities: without edema; without calf swelling or tenderness; symmetrical without gross deformities Skin: warm and dry; without rash or lesions Psychological: alert and cooperative; normal mood and affect   Allergies  Allergen Reactions  . No  Known Allergies     Past Medical History:  Diagnosis Date  . Arthritis   . Asthma    last asthma attack at age 35  . GERD (gastroesophageal reflux disease)   . Headache   . Other specified iron deficiency anemias   . PAC (premature atrial contraction)    occasional PAC's   Social History   Socioeconomic History  . Marital status: Married    Spouse name: Not on file  . Number of children: Not on file  . Years of education: Not on file  . Highest education level: Not on file  Occupational History  . Not on file  Tobacco Use  . Smoking status: Never Smoker  . Smokeless tobacco: Never Used  Substance and Sexual Activity  . Alcohol use: No  . Drug use: No  . Sexual activity: Not on file  Other Topics Concern  . Not on file  Social History Narrative  . Not on file   Social Determinants of Health   Financial Resource Strain:   . Difficulty of Paying Living Expenses: Not on file  Food Insecurity:   . Worried About Charity fundraiser in the Last Year: Not on file  . Ran Out of Food in the Last Year: Not on file  Transportation Needs:   . Lack of Transportation (Medical): Not on file  . Lack of Transportation (Non-Medical): Not on file  Physical Activity:   . Days of Exercise per Week: Not on file  . Minutes of Exercise per Session: Not on file  Stress:   . Feeling of Stress : Not on file  Social Connections:   . Frequency of Communication with Friends and Family: Not  on file  . Frequency of Social Gatherings with Friends and Family: Not on file  . Attends Religious Services: Not on file  . Active Member of Clubs or Organizations: Not on file  . Attends Archivist Meetings: Not on file  . Marital Status: Not on file  Intimate Partner Violence:   . Fear of Current or Ex-Partner: Not on file  . Emotionally Abused: Not on file  . Physically Abused: Not on file  . Sexually Abused: Not on file   Family History  Problem Relation Age of Onset  . Alzheimer's  disease Mother   . Asthma Father    Past Surgical History:  Procedure Laterality Date  . CATARACT EXTRACTION    . HERNIA REPAIR    . SPINE SURGERY  10/29/2016  . Tony Dawes, MD 08/21/20 1108

## 2020-08-21 NOTE — H&P (Addendum)
History and Physical    Tony Roberts CVU:[id number] DOB: August 15, 1941 DOA: 08/21/2020  PCP: Alroy Dust, L.Marlou Sa, MD (Confirm with patient/family/NH records and if not entered, this has to be entered at [facility] point of entry) Patient coming from: Home  I have personally briefly reviewed patient's old medical records in [place]  Chief Complaint: Palpitations  HPI: Tony Roberts is a 79 y.o. male with medical history significant of HTN, GERD, asthma, diverticulosis, presented with new onset of palpitations.  Symptoms started 4 to 5 days ago, with persistent palpitations, exertional shortness of breath and worsening of cough (chronic cough secondary to asthma, for which patient been following with allergist).  Patient went to see allergist 3 days ago, was found in rapid A. fib.  Denied any chest pain, no leg swelling no fever chills weight loss.  Before this, patient has had 3 TIAs in last 1 month.  Each episode last about 30 minutes, symptoms was one-sided numbness and facial droop and speech problems.  Symptoms resolved spontaneously.  Patient went to see PCP after the first episode, and MRI was done on 11/15, with no acute stroke but tiny chronic infarct suspected in left cerebellum.  ED Course: Patient was found heart rate in the 150s, Cardizem drip was started.  Review of Systems: As per HPI otherwise 14 point review of systems negative.    Past Medical History:  Diagnosis Date  . Arthritis   . Asthma    last asthma attack at age 61  . GERD (gastroesophageal reflux disease)   . Headache   . Other specified iron deficiency anemias   . PAC (premature atrial contraction)    occasional PAC's    Past Surgical History:  Procedure Laterality Date  . CATARACT EXTRACTION    . HERNIA REPAIR    . SPINE SURGERY  10/29/2016  . TONSILLECTOMY       reports that he has never smoked. He has never used smokeless tobacco. He reports that he does not drink alcohol and does not use  drugs.  No Known Allergies  Family History  Problem Relation Age of Onset  . Alzheimer's disease Mother   . Asthma Father      Prior to Admission medications   Medication Sig Start Date End Date Taking? Authorizing Provider  ADVAIR DISKUS 250-50 MCG/DOSE AEPB INHALE 1 PUFF BY MOUTH ONCE TO TWICE A DAY TO PREVENT COUGH OR WHEEZE. RINSE, GARGLE, AND SPIT AFTER USE. Patient taking differently: Inhale 1 puff into the lungs See admin instructions. Inhale 1 puff into the lungs at bedtime and an additional 1 puff in the morning as needed for coughing, wheezing, or shortness of breath 04/01/20  Yes Kozlow, Donnamarie Poag, MD  albuterol (PROVENTIL HFA) 108 (90 Base) MCG/ACT inhaler Inhale two puffs every four to six hours as needed for cough or wheeze. Patient taking differently: Inhale 2 puffs into the lungs See admin instructions. Inhale 2 puffs into the lungs every four to six hours as needed for coughing or wheezing 05/19/16  Yes Kozlow, Donnamarie Poag, MD  amLODipine (NORVASC) 5 MG tablet Take 5 mg by mouth at bedtime.  12/18/15  Yes [provider]  esomeprazole (NEXIUM) 40 MG capsule Take 40 mg by mouth See admin instructions. Take 40 mg by mouth in the morning before breakfast and an additional 40 mg once a day as needed for recurring heartburn   Yes [provider]  famotidine (PEPCID) 40 MG tablet Take 40 mg by mouth at bedtime.  03/17/19  Yes [provider]  montelukast (SINGULAIR) 10 MG tablet TAKE 1 TABLET (10 MG TOTAL) BY MOUTH AT BEDTIME. 04/23/20  Yes Kozlow, Donnamarie Poag, MD  triamcinolone (NASACORT ALLERGY 24HR) 55 MCG/ACT AERO nasal inhaler Place 1 spray into the nose daily as needed (for allergies or rhinitis).    Yes [provider]  omeprazole (PRILOSEC) 40 MG capsule Take 1 capsule (40 mg total) by mouth in the morning and at bedtime. Patient not taking: Reported on 08/21/2020 01/23/20   Jiles Prows, MD    Physical Exam: Vitals:   08/21/20 1300 08/21/20 1315  08/21/20 1330 08/21/20 1430  BP: 124/83 (!) 126/92 (!) 116/93 (!) 118/98  Pulse: (!) 144 (!) 105 (!) 134 (!) 129  Resp: 16 16 16 15   Temp:      TempSrc:      SpO2: 95% 94% 93% 97%    Constitutional: NAD, calm, comfortable Vitals:   08/21/20 1300 08/21/20 1315 08/21/20 1330 08/21/20 1430  BP: 124/83 (!) 126/92 (!) 116/93 (!) 118/98  Pulse: (!) 144 (!) 105 (!) 134 (!) 129  Resp: 16 16 16 15   Temp:      TempSrc:      SpO2: 95% 94% 93% 97%   Eyes: PERRL, lids and conjunctivae normal ENMT: Mucous membranes are moist. Posterior pharynx clear of any exudate or lesions.Normal dentition.  Neck: normal, supple, no masses, no thyromegaly Respiratory: clear to auscultation bilaterally, no wheezing, no crackles. Normal respiratory effort. No accessory muscle use.  Cardiovascular: Irregular rate, no murmurs / rubs / gallops. No extremity edema. 2+ pedal pulses. No carotid bruits.  Abdomen: no tenderness, no masses palpated. No hepatosplenomegaly. Bowel sounds positive.  Musculoskeletal: no clubbing / cyanosis. No joint deformity upper and lower extremities. Good ROM, no contractures. Normal muscle tone.  Skin: no rashes, lesions, ulcers. No induration Neurologic: CN 2-12 grossly intact. Sensation intact, DTR normal. Strength 5/5 in all 4.  Psychiatric: Normal judgment and insight. Alert and oriented x 3. Normal mood.     Labs on Admission: I have personally reviewed following labs and imaging studies  CBC: Recent Labs  Lab 08/21/20 1105  WBC 7.2  NEUTROABS 5.3  HGB 11.6*  HCT 38.1*  MCV 80.4  PLT 480   Basic Metabolic Panel: Recent Labs  Lab 08/21/20 1105  NA 139  K 3.4*  CL 105  CO2 21*  GLUCOSE 135*  BUN 10  CREATININE 1.10  CALCIUM 8.8*   GFR: Estimated Creatinine Clearance: 56.1 mL/min (by C-G formula based on SCr of 1.1 mg/dL). Liver Function Tests: Recent Labs  Lab 08/21/20 1105  AST 26  ALT 20  ALKPHOS 64  BILITOT 1.4*  PROT 6.3*  ALBUMIN 3.5   No  results for input(s): LIPASE, AMYLASE in the last 168 hours. No results for input(s): AMMONIA in the last 168 hours. Coagulation Profile: No results for input(s): INR, PROTIME in the last 168 hours. Cardiac Enzymes: No results for input(s): CKTOTAL, CKMB, CKMBINDEX, TROPONINI in the last 168 hours. BNP (last 3 results) No results for input(s): PROBNP in the last 8760 hours. HbA1C: No results for input(s): HGBA1C in the last 72 hours. CBG: No results for input(s): GLUCAP in the last 168 hours. Lipid Profile: No results for input(s): CHOL, HDL, LDLCALC, TRIG, CHOLHDL, LDLDIRECT in the last 72 hours. Thyroid Function Tests: No results for input(s): TSH, T4TOTAL, FREET4, T3FREE, THYROIDAB in the last 72 hours. Anemia Panel: No results for input(s): VITAMINB12, FOLATE, FERRITIN, TIBC, IRON, RETICCTPCT  in the last 72 hours. Urine analysis:    Component Value Date/Time   COLORURINE YELLOW 10/23/2016 1430   APPEARANCEUR CLEAR 10/23/2016 1430   LABSPEC 1.014 10/23/2016 1430   PHURINE 5.0 10/23/2016 1430   GLUCOSEU NEGATIVE 10/23/2016 1430   HGBUR NEGATIVE 10/23/2016 1430   BILIRUBINUR NEGATIVE 10/23/2016 1430   KETONESUR NEGATIVE 10/23/2016 1430   PROTEINUR NEGATIVE 10/23/2016 1430   NITRITE NEGATIVE 10/23/2016 1430   LEUKOCYTESUR NEGATIVE 10/23/2016 1430    Radiological Exams on Admission: DG Chest Port 1 View  Result Date: 08/21/2020 CLINICAL DATA:  Shortness of breath and palpitation EXAM: PORTABLE CHEST 1 VIEW COMPARISON:  10/23/2016 FINDINGS: Cardiomegaly and aortic tortuosity. Cardiomegaly appears new/progressed. Suspect small right pleural effusion. Mild interstitial prominence that is similar to prior. No pneumothorax. IMPRESSION: 1. Cardiopericardial enlargement. 2. Small right pleural effusion. Electronically Signed   By: Monte Fantasia M.D.   On: 08/21/2020 11:37    EKG: Independently reviewed.  Rapid A. fib  Assessment/Plan Active Problems:   A-fib (HCC)  (please  populate well all problems here in Problem List. (For example, if patient is on BP meds at home and you resume or decide to hold them, it is a problem that needs to be her. Same for CAD, COPD, HLD and so on)  New onset A. fib with RVR -Of CHADS2=4, anticoagulation indicated, however given the patient told me that he had a severe anemia years ago with the hemoglobin drop will drop to 5, and for which patient followed with hematologist for year and was placed on iron supplement, hemoglobin level recovered but patient never went to see GI for his severe iron deficiency anemia.  Patient did have several colonoscopy in the past and was told he had alkalosis.  Again patient denied any abdominal pain dark-colored stool, hemorrhoids or blood in the stool. -Discussed with cardiology briefly who will see the patient this afternoon, will start patient on aspirin while waiting for cardiologist plan. -Heart rate not controlled on the Cardizem drip in the last 3 hours, expect another rate control regimen versus cardioversion.  Defer to cardiology to start anticoagulation for cardioversion.  Hypokalemia -Replace and recheck, check magnesium level as well  TIAs recent -MRI -2 weeks ago. -Check lipid panel and A1c  Chronic normocytic anemia -As above, will need outpatient GI work-up -Send iron study  Asthma, mild intermittent -Change albuterol to Atrovent.  DVT prophylaxis: Lovenox Code Status: Full Code Family Communication: Wife at bedside Disposition Plan: Expect more than 2 midnight hospital stay for new onset A. Fib, expect patient may need cardioversion. Consults called: Cardiology Admission status: PCU   Lequita Halt MD [facility] Pager [phone]  08/21/2020, 2:34 PM

## 2020-08-22 ENCOUNTER — Encounter (HOSPITAL_COMMUNITY): Payer: Self-pay | Admitting: Internal Medicine

## 2020-08-22 ENCOUNTER — Inpatient Hospital Stay (HOSPITAL_COMMUNITY): Payer: Medicare Other

## 2020-08-22 ENCOUNTER — Encounter (HOSPITAL_COMMUNITY): Payer: Self-pay | Admitting: Anesthesiology

## 2020-08-22 ENCOUNTER — Other Ambulatory Visit (HOSPITAL_COMMUNITY): Payer: Self-pay | Admitting: Family Medicine

## 2020-08-22 DIAGNOSIS — I350 Nonrheumatic aortic (valve) stenosis: Secondary | ICD-10-CM | POA: Diagnosis not present

## 2020-08-22 DIAGNOSIS — D508 Other iron deficiency anemias: Secondary | ICD-10-CM | POA: Diagnosis not present

## 2020-08-22 DIAGNOSIS — I361 Nonrheumatic tricuspid (valve) insufficiency: Secondary | ICD-10-CM

## 2020-08-22 DIAGNOSIS — I4891 Unspecified atrial fibrillation: Secondary | ICD-10-CM

## 2020-08-22 DIAGNOSIS — I10 Essential (primary) hypertension: Secondary | ICD-10-CM | POA: Diagnosis not present

## 2020-08-22 DIAGNOSIS — J4520 Mild intermittent asthma, uncomplicated: Secondary | ICD-10-CM

## 2020-08-22 DIAGNOSIS — I340 Nonrheumatic mitral (valve) insufficiency: Secondary | ICD-10-CM | POA: Diagnosis not present

## 2020-08-22 DIAGNOSIS — I517 Cardiomegaly: Secondary | ICD-10-CM

## 2020-08-22 DIAGNOSIS — D649 Anemia, unspecified: Secondary | ICD-10-CM

## 2020-08-22 LAB — BASIC METABOLIC PANEL
Anion gap: 12 (ref 5–15)
BUN: 9 mg/dL (ref 8–23)
CO2: 24 mmol/L (ref 22–32)
Calcium: 9 mg/dL (ref 8.9–10.3)
Chloride: 105 mmol/L (ref 98–111)
Creatinine, Ser: 1.09 mg/dL (ref 0.61–1.24)
GFR, Estimated: >60 mL/min (ref >60)
Glucose, Bld: 121 mg/dL — ABNORMAL HIGH (ref 70–99)
Potassium: 3.8 mmol/L (ref 3.5–5.1)
Sodium: 141 mmol/L (ref 135–145)

## 2020-08-22 LAB — ECHOCARDIOGRAM COMPLETE
Calc EF: 25.3 %
Height: 65 in
P 1/2 time: 953 msec
S' Lateral: 3.6 cm
Single Plane A2C EF: 25.1 %
Single Plane A4C EF: 26.6 %
Weight: 2994.73 oz

## 2020-08-22 LAB — CBC
HCT: 36.3 % — ABNORMAL LOW (ref 39.0–52.0)
Hemoglobin: 11.5 g/dL — ABNORMAL LOW (ref 13.0–17.0)
MCH: 24.7 pg — ABNORMAL LOW (ref 26.0–34.0)
MCHC: 31.7 g/dL (ref 30.0–36.0)
MCV: 78.1 fL — ABNORMAL LOW (ref 80.0–100.0)
Platelets: 168 10*3/uL (ref 150–400)
RBC: 4.65 MIL/uL (ref 4.22–5.81)
RDW: 16.3 % — ABNORMAL HIGH (ref 11.5–15.5)
WBC: 6.6 10*3/uL (ref 4.0–10.5)
nRBC: 0 % (ref 0.0–0.2)

## 2020-08-22 LAB — HEPARIN LEVEL (UNFRACTIONATED)
Heparin Unfractionated: <0.1 IU/mL — ABNORMAL LOW (ref 0.30–0.70)
Heparin Unfractionated: 0.12 IU/mL — ABNORMAL LOW (ref 0.30–0.70)

## 2020-08-22 IMAGING — DX DG SHOULDER 1V*L*
2 series · 2 of 2 positions shown · non-contrast
Comparison: None.

CLINICAL DATA: Recent fall with left shoulder pain and deformity,
initial encounter

EXAM:
LEFT SHOULDER

[shoulder ap]
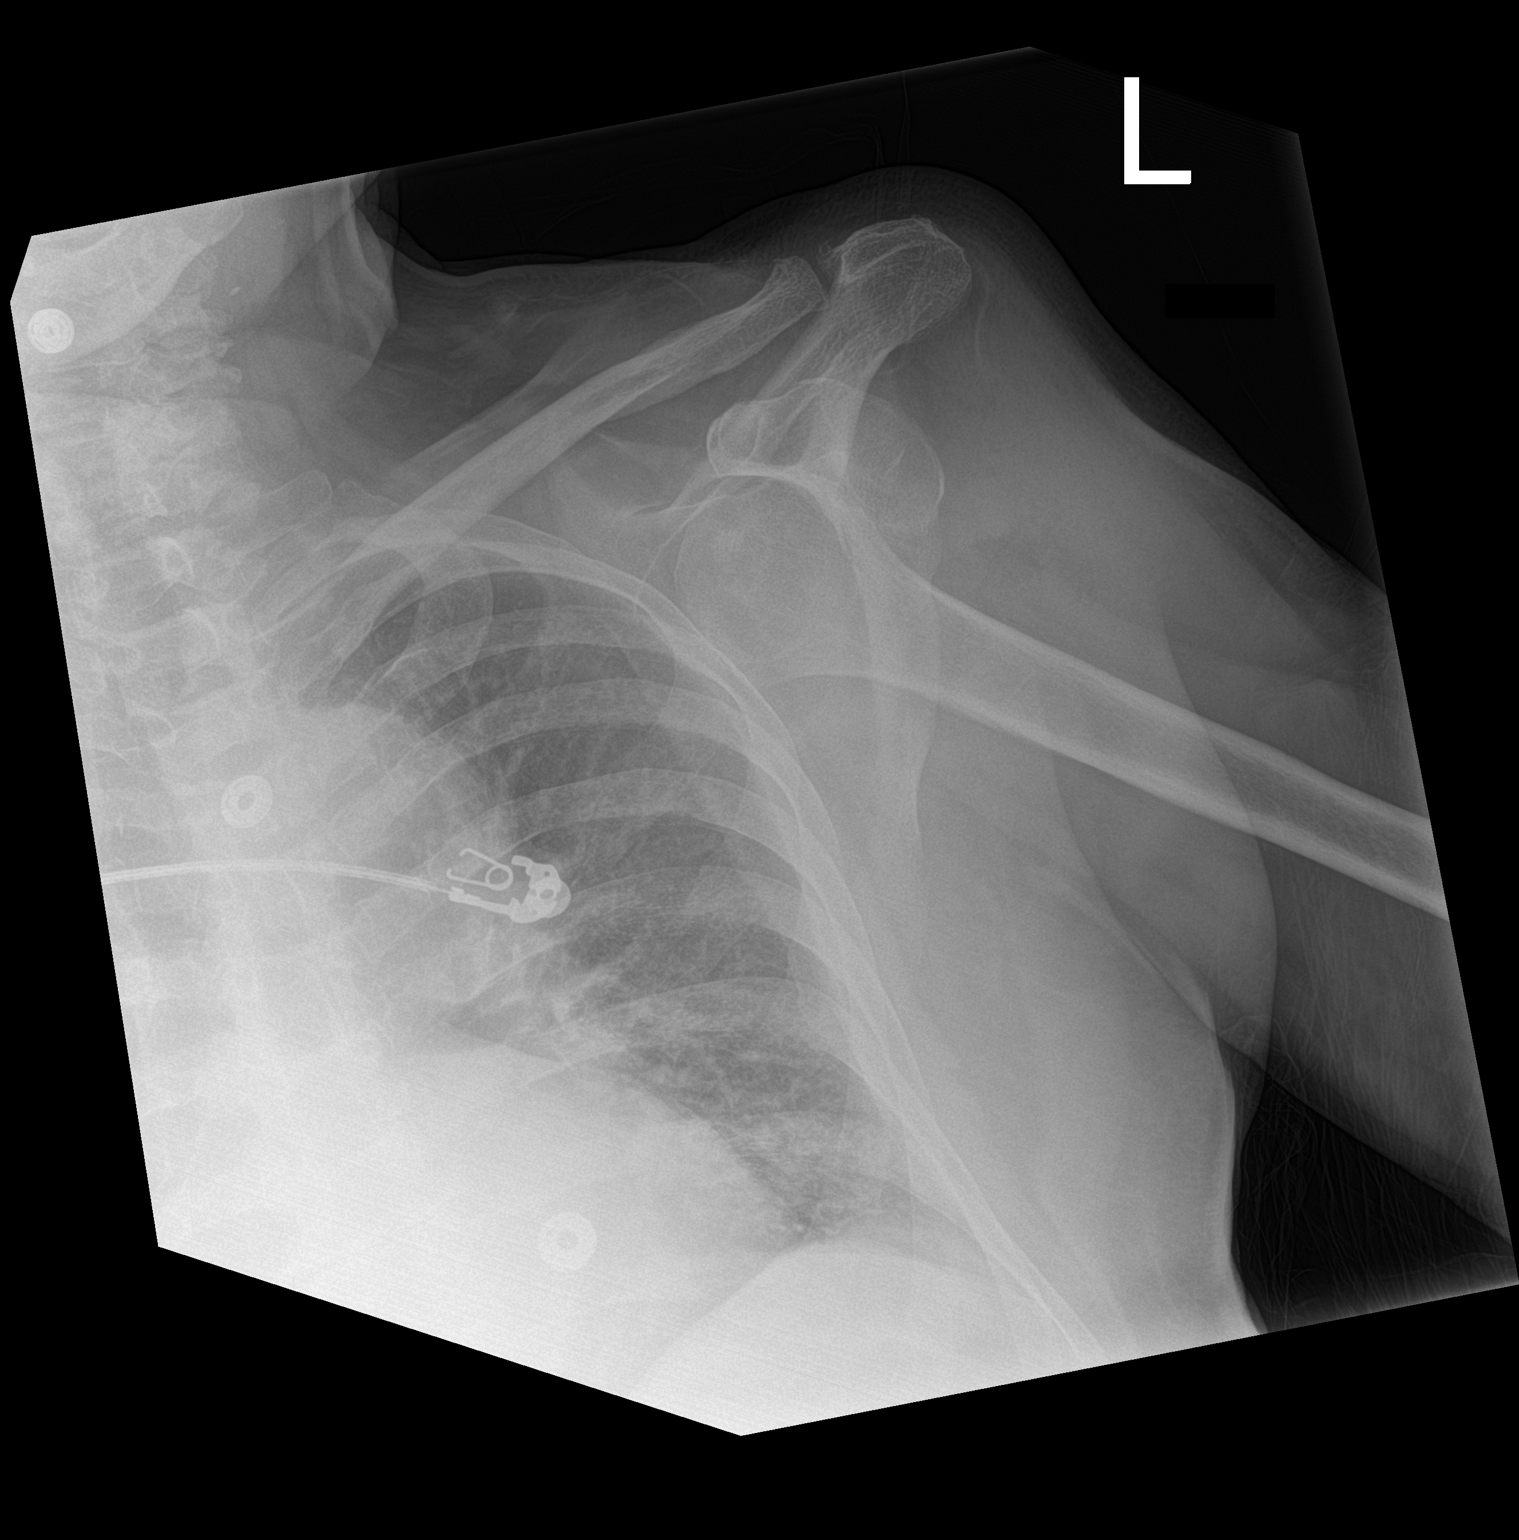

[shoulder obl]
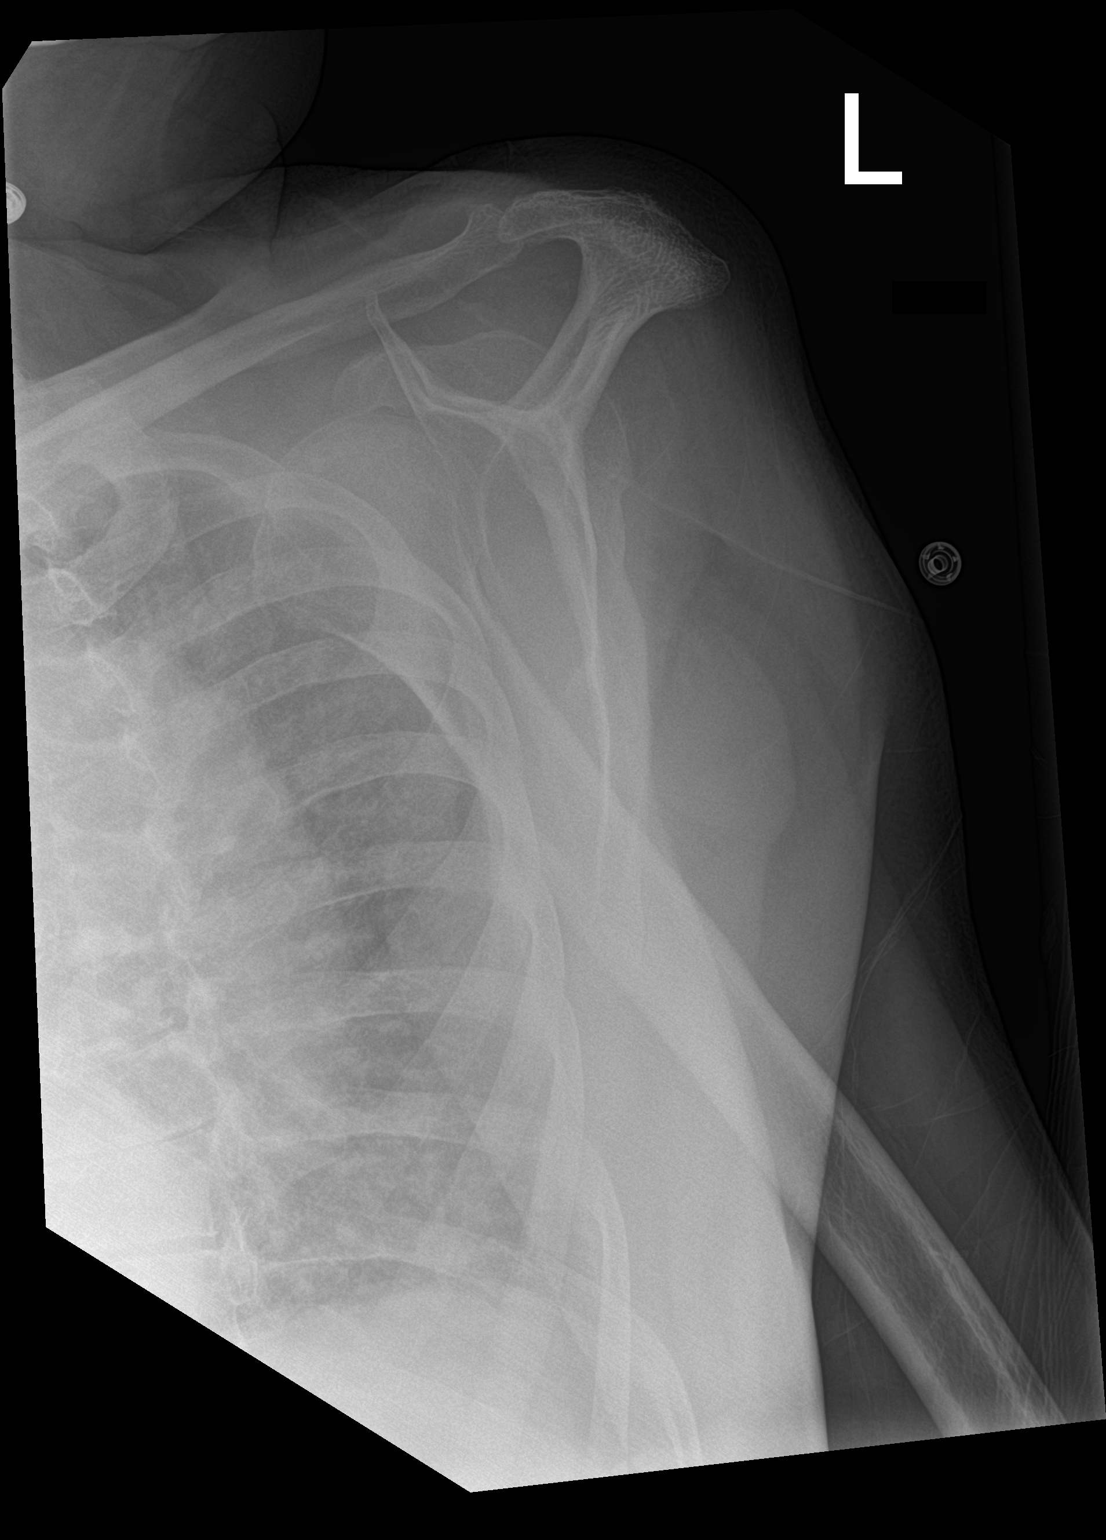

[2 of 2 positions shown; findings below may reference images not displayed]

FINDINGS: Anterior inferior dislocation of the humeral head with respect to
the glenoid is noted. No acute fracture is seen. The underlying bony
thorax and remainder of the shoulder girdle appear within normal
limits.
IMPRESSION: Anterior inferior dislocation of the left humeral head as described.

## 2020-08-22 IMAGING — CT CT CERVICAL SPINE W/O CM
3 of 4 series · 13 of 33 positions shown, 16 images · non-contrast
Comparison: None.

CLINICAL DATA: Head trauma

EXAM:
CT HEAD WITHOUT CONTRAST
CT CERVICAL SPINE WITHOUT CONTRAST
TECHNIQUE: Multidetector CT imaging of the head and cervical spine was
performed following the standard protocol without intravenous
contrast. Multiplanar CT image reconstructions of the cervical spine
were also generated.

[Series 5: c_spine 2.0 st · axial · 0.35mm/px · z∈[-256,-120]mm · 5 of 103 slices shown, 7 images]
[im 18/103  soft-tissue]
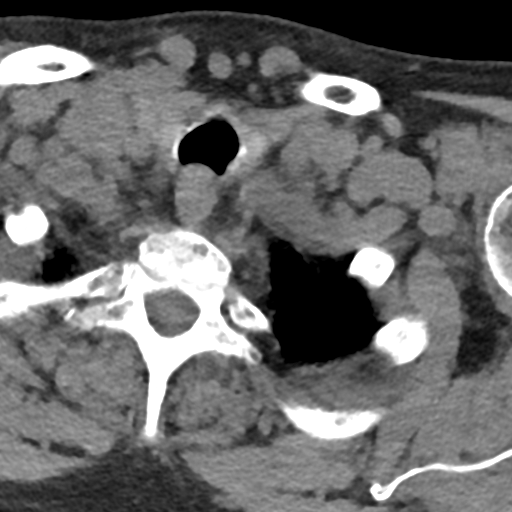
[im 18/103  bone]
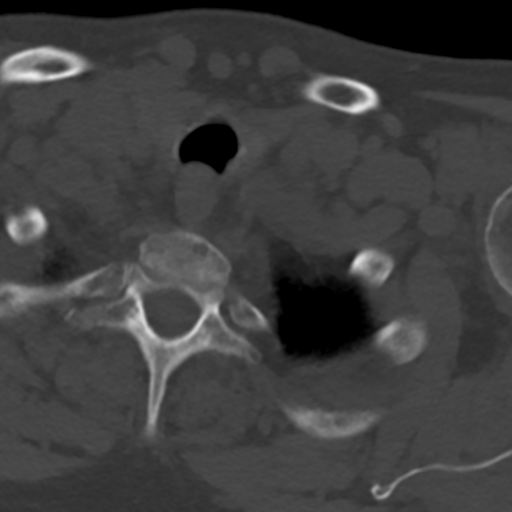
[im 35/103  bone]
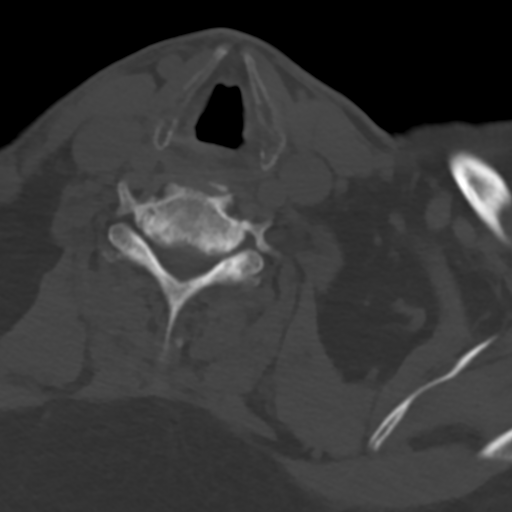
[im 52/103  bone]
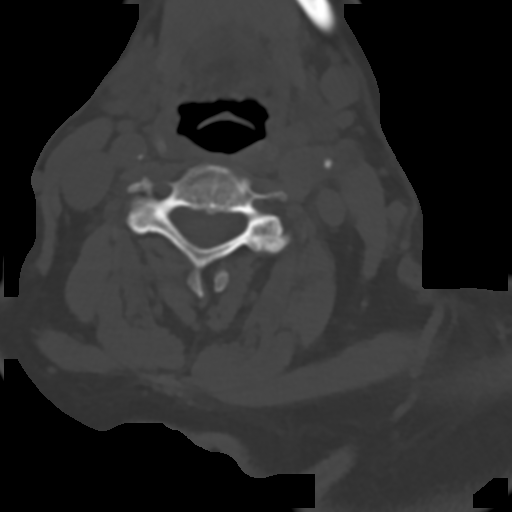
[im 69/103  bone]
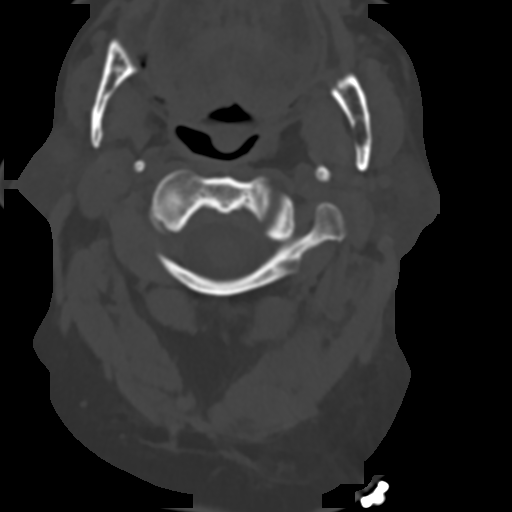
[im 86/103  soft-tissue]
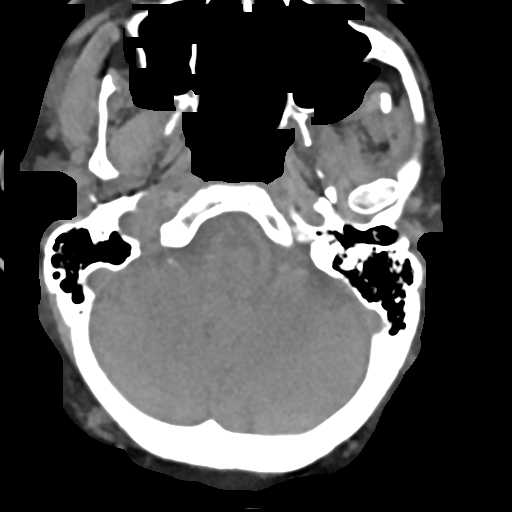
[im 86/103  bone]
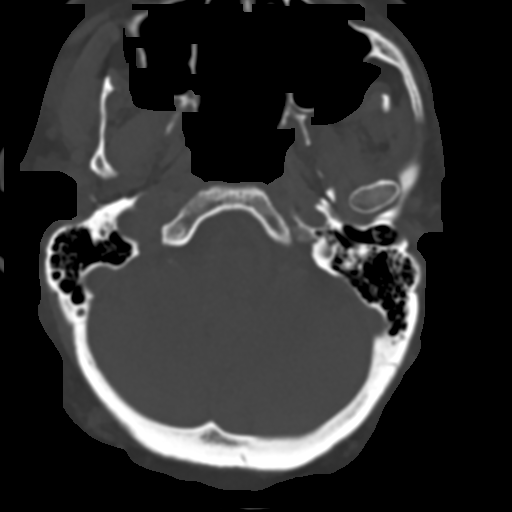

[Series 6: coronal bone · coronal · 0.30mm/px · 3 of 98 slices shown]
[im 20/98  bone]
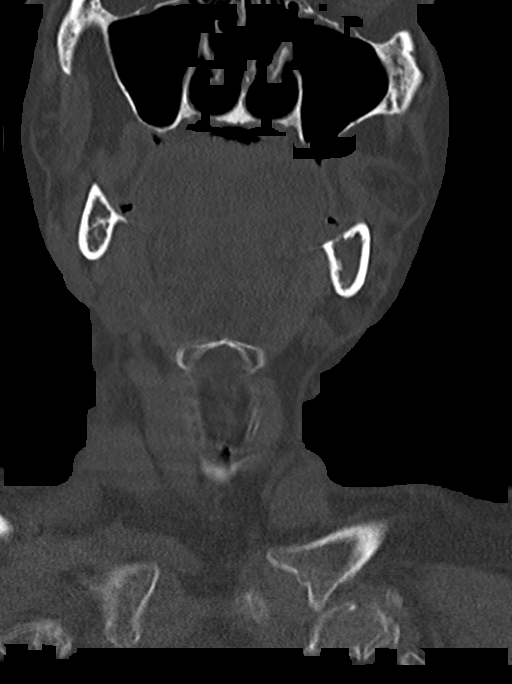
[im 39/98  bone]
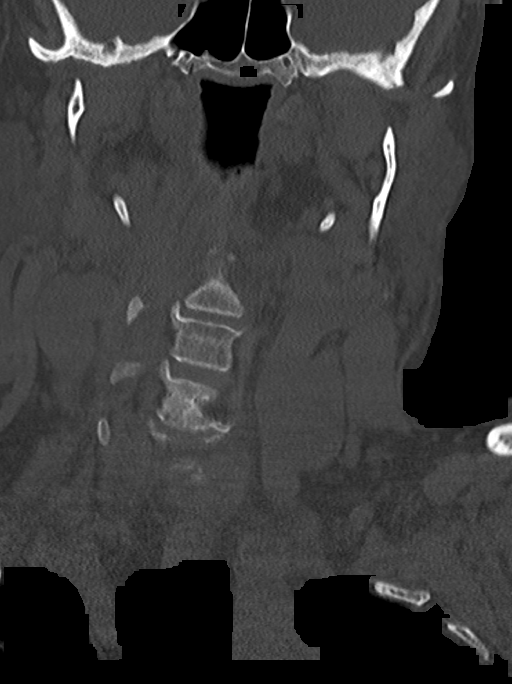
[im 59/98  bone]
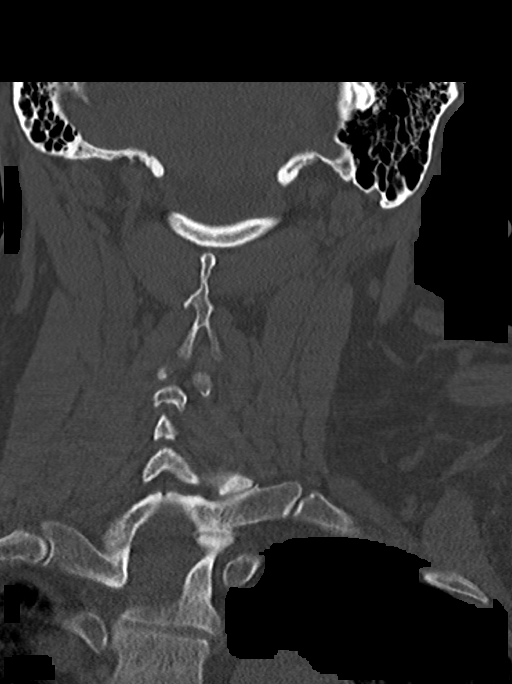

[Series 7: sagittal bone · sagittal · 0.33mm/px · 5 of 61 slices shown, 6 images]
[im 21/61  bone]
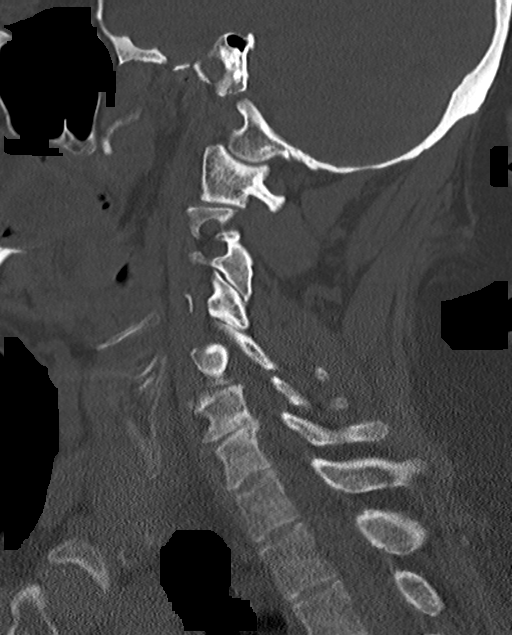
[im 26/61  bone]
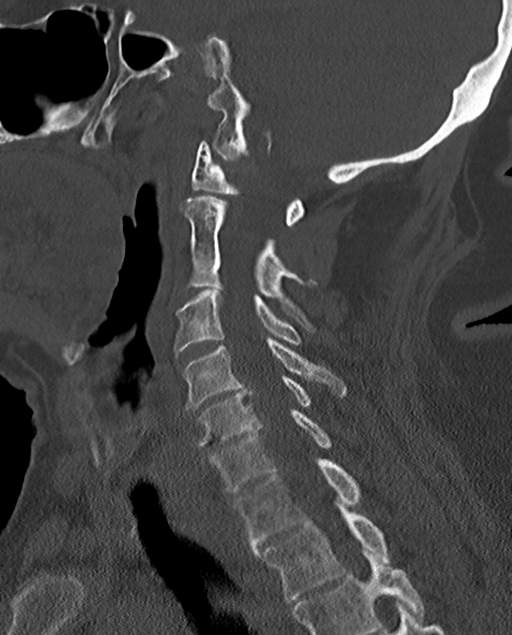
[im 31/61  soft-tissue]
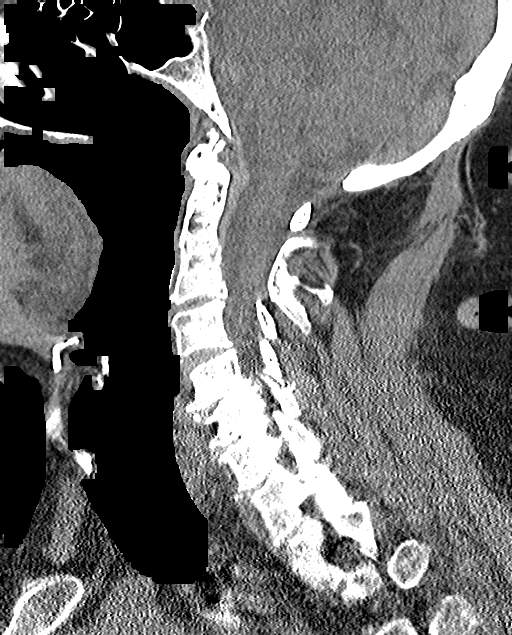
[im 31/61  bone]
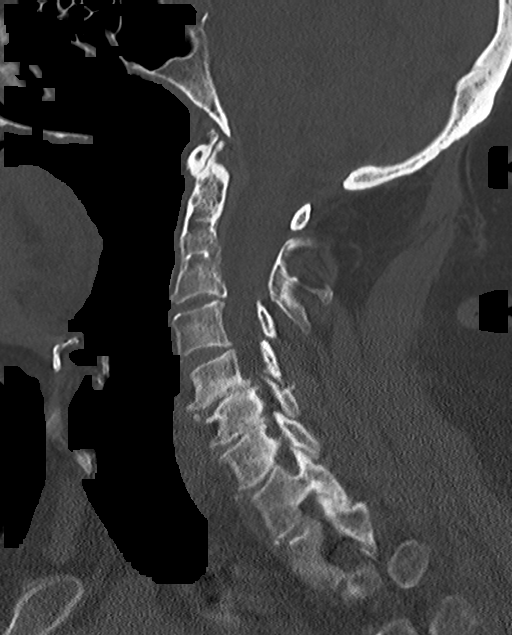
[im 36/61  bone]
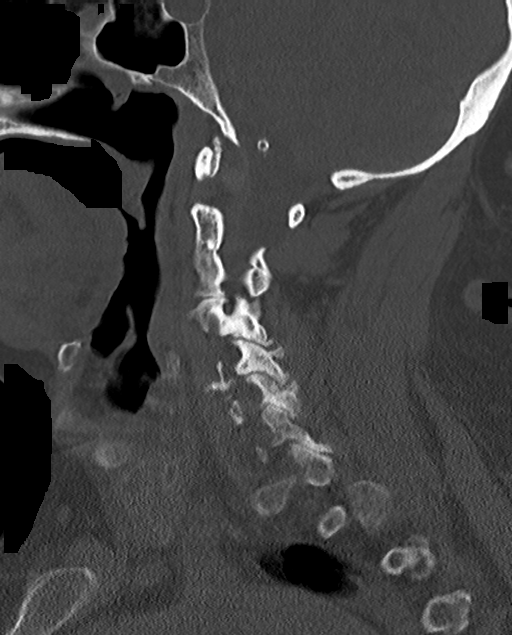
[im 41/61  bone]
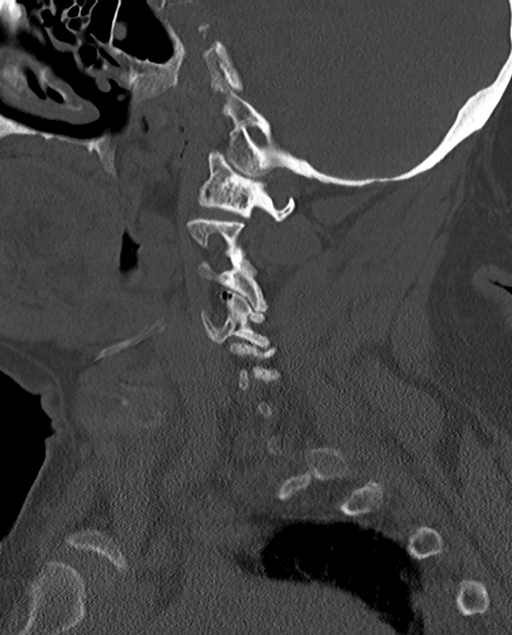

[13 of 33 positions shown; findings below may reference images not displayed]

FINDINGS: CT HEAD FINDINGS

Brain: There is no mass, hemorrhage or extra-axial collection. The
size and configuration of the ventricles and extra-axial CSF spaces
are normal. The brain parenchyma is normal, without evidence of
acute or chronic infarction.

Vascular: Atherosclerotic calcification of the vertebral and
internal carotid arteries at the skull base. No abnormal
hyperdensity of the major intracranial arteries or dural venous
sinuses.

Skull: The visualized skull base, calvarium and extracranial soft
tissues are normal.

Sinuses/Orbits: No fluid levels or advanced mucosal thickening of
the visualized paranasal sinuses. No mastoid or middle ear effusion.
The orbits are normal.

CT CERVICAL SPINE FINDINGS

Alignment: No static subluxation. Facets are aligned. Occipital
condyles are normally positioned.

Skull base and vertebrae: No acute fracture.

Soft tissues and spinal canal: No prevertebral fluid or swelling. No
visible canal hematoma.

Disc levels: No advanced spinal canal or neural foraminal stenosis.

Upper chest: No pneumothorax, pulmonary nodule or pleural effusion.

Other: Incidentally noted DARDLLY configuration.
IMPRESSION: 1. No acute intracranial abnormality.
2. No acute fracture or static subluxation of the cervical spine.

## 2020-08-22 IMAGING — CT CT HEAD W/O CM
2 of 4 series · 14 of 47 positions shown, 17 images · non-contrast
Comparison: None.

CLINICAL DATA: Head trauma

EXAM:
CT HEAD WITHOUT CONTRAST
CT CERVICAL SPINE WITHOUT CONTRAST
TECHNIQUE: Multidetector CT imaging of the head and cervical spine was
performed following the standard protocol without intravenous
contrast. Multiplanar CT image reconstructions of the cervical spine
were also generated.

[Series 4: head 2.0 h70h · axial · 0.43mm/px · z∈[-119,+45]mm · 11 of 92 slices shown, 14 images]
[im 5/92  brain]
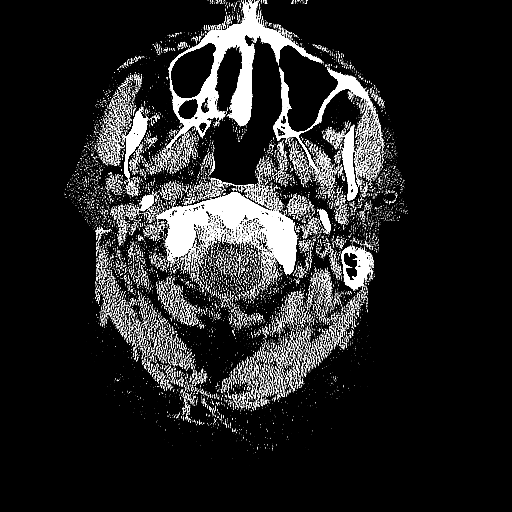
[im 5/92  bone]
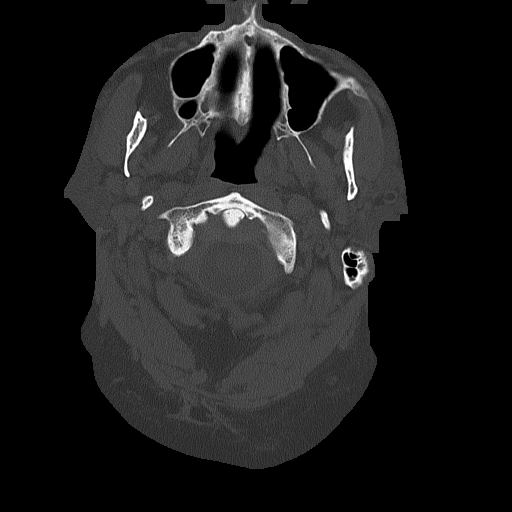
[im 14/92  brain]
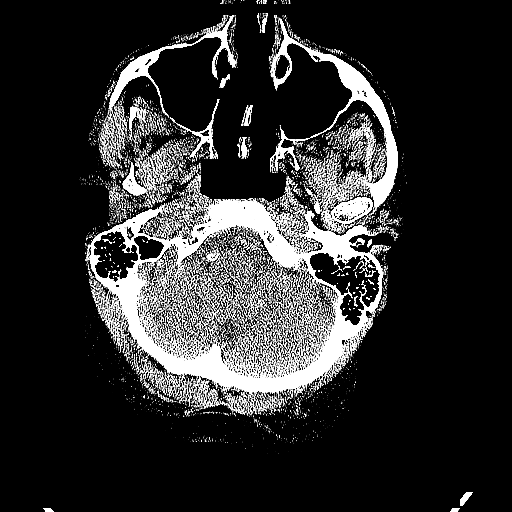
[im 23/92  brain]
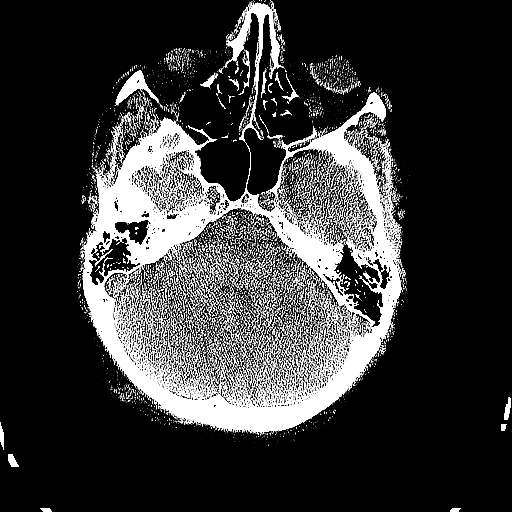
[im 32/92  brain]
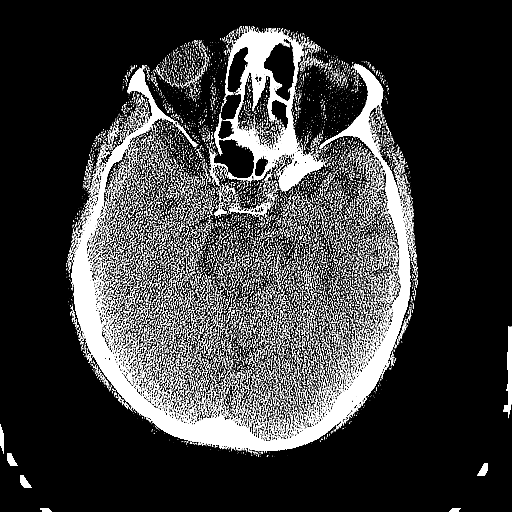
[im 37/92  brain]
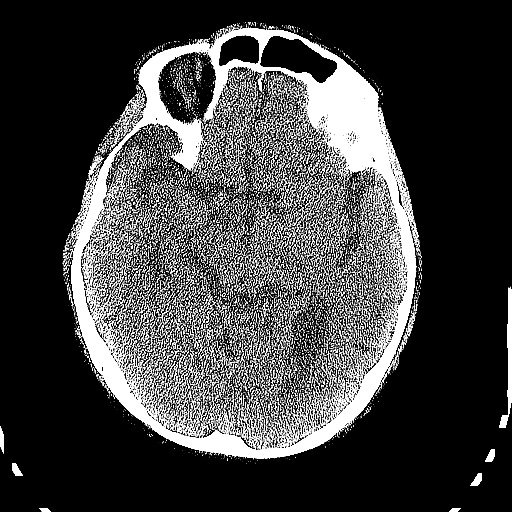
[im 37/92  bone]
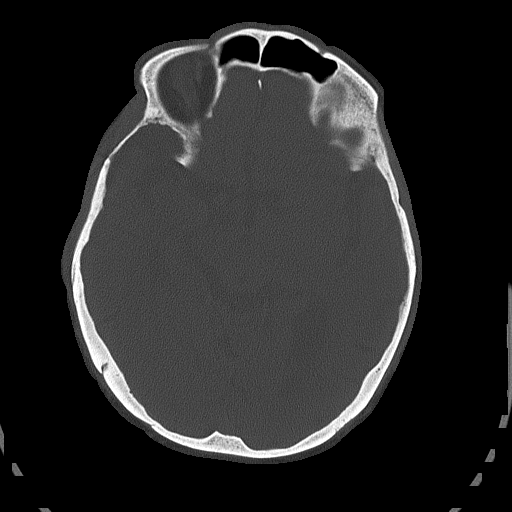
[im 46/92  brain]
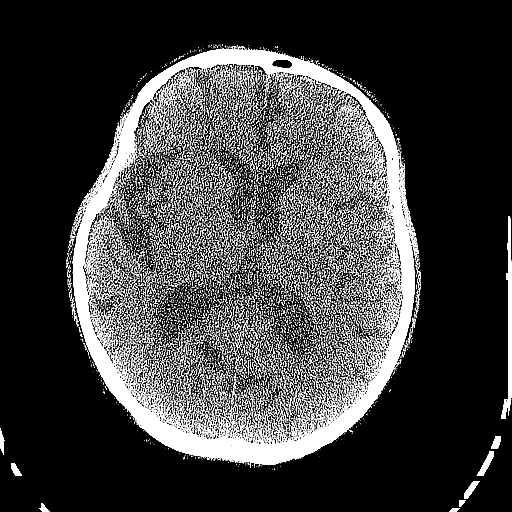
[im 55/92  brain]
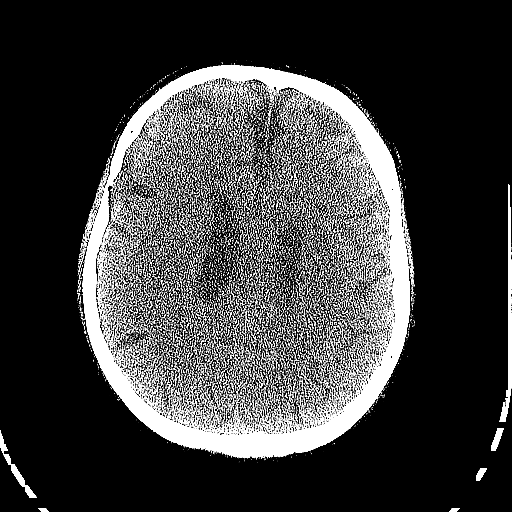
[im 60/92  brain]
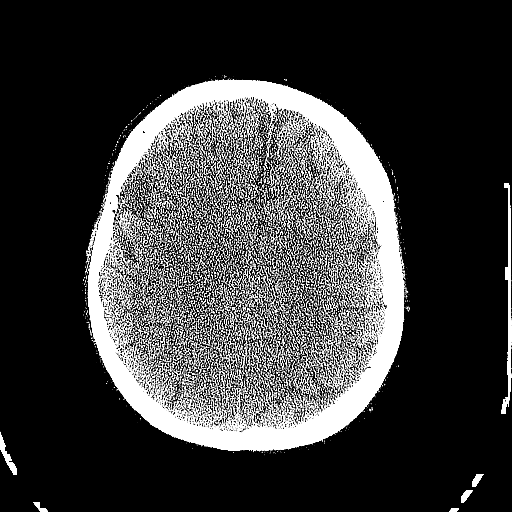
[im 69/92  brain]
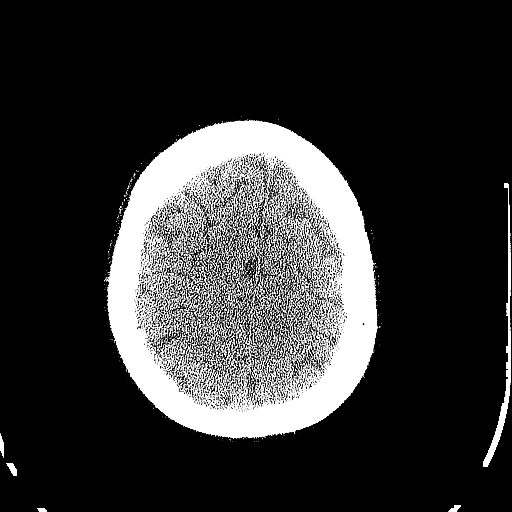
[im 69/92  bone]
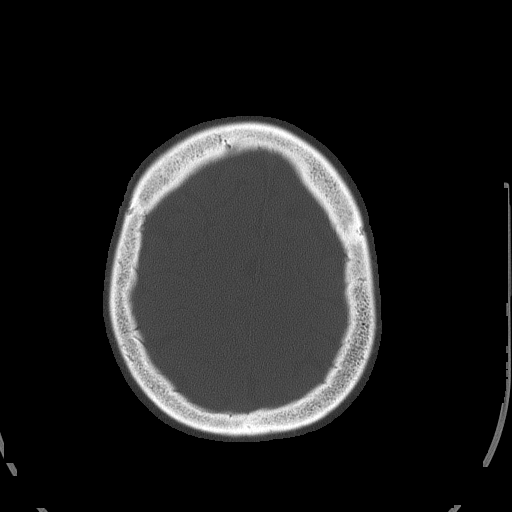
[im 78/92  brain]
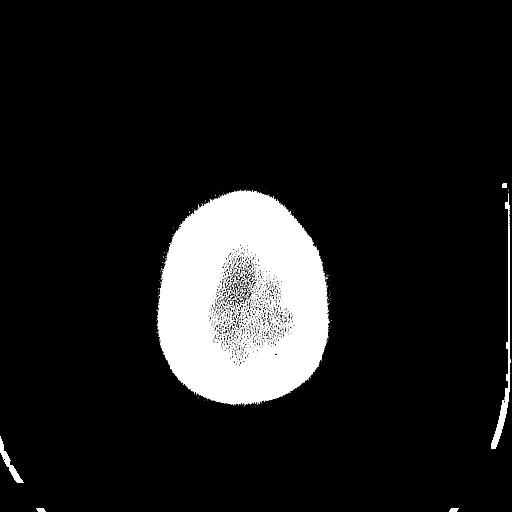
[im 87/92  brain]
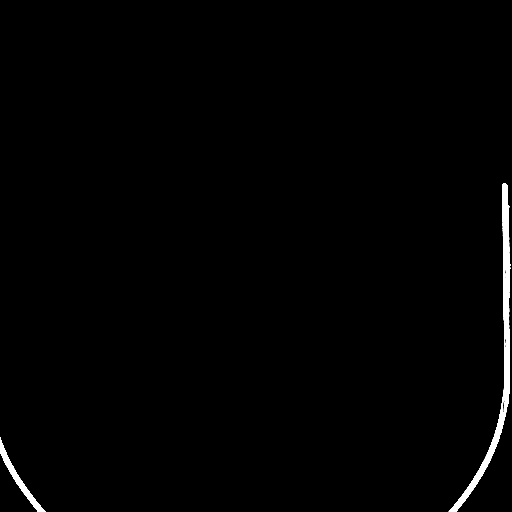

[Series 5: head 3.0 mpr cor · coronal · 0.36mm/px · 3 of 80 slices shown]
[im 27/80  brain]
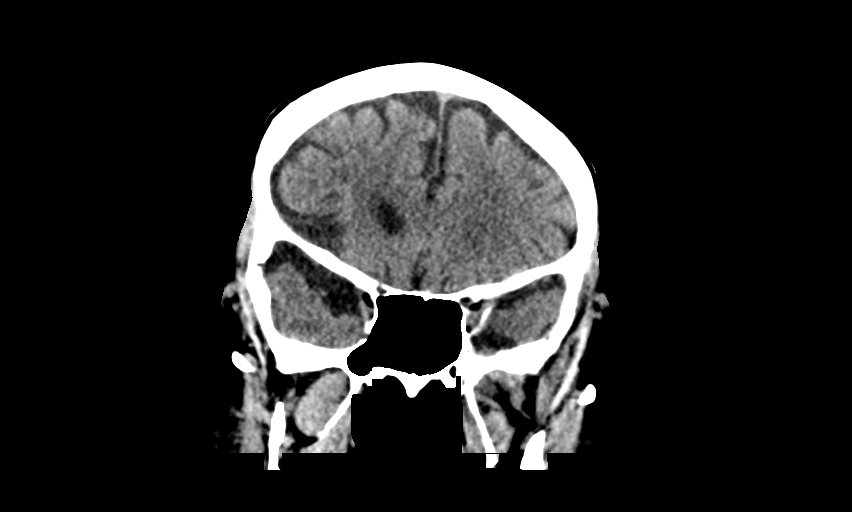
[im 36/80  brain]
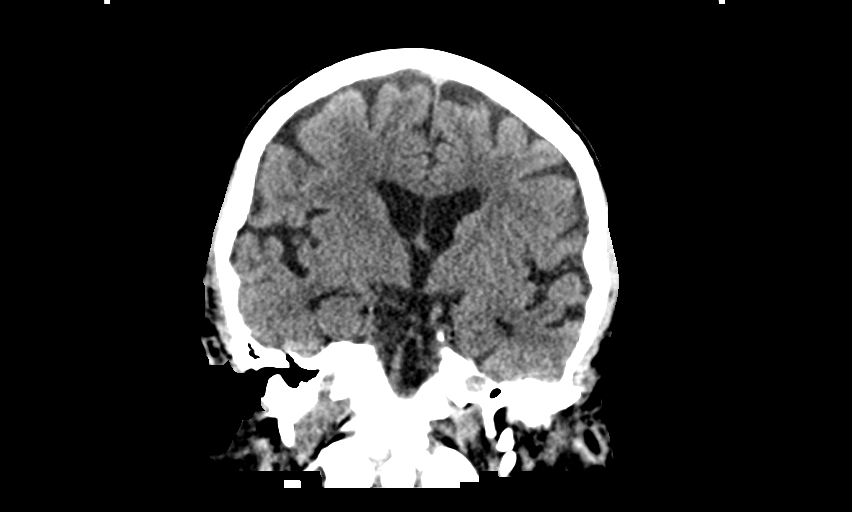
[im 44/80  brain]
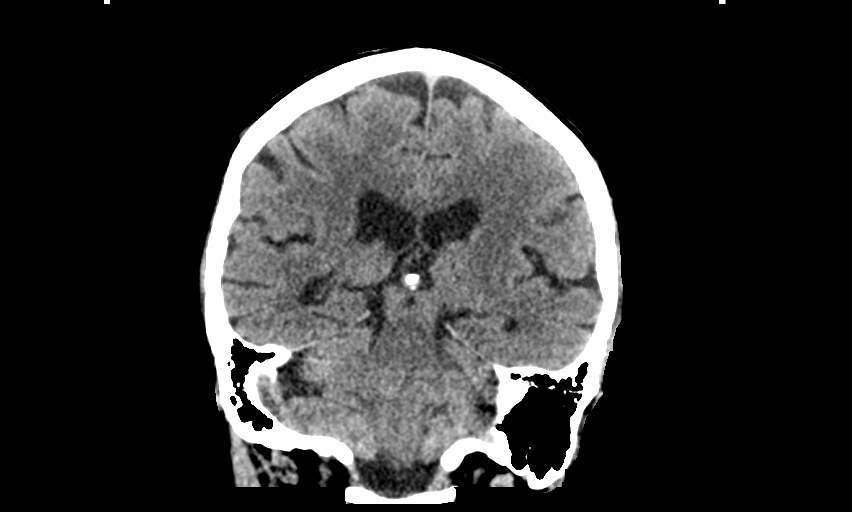

[14 of 47 positions shown; findings below may reference images not displayed]

FINDINGS: CT HEAD FINDINGS

Brain: There is no mass, hemorrhage or extra-axial collection. The
size and configuration of the ventricles and extra-axial CSF spaces
are normal. The brain parenchyma is normal, without evidence of
acute or chronic infarction.

Vascular: Atherosclerotic calcification of the vertebral and
internal carotid arteries at the skull base. No abnormal
hyperdensity of the major intracranial arteries or dural venous
sinuses.

Skull: The visualized skull base, calvarium and extracranial soft
tissues are normal.

Sinuses/Orbits: No fluid levels or advanced mucosal thickening of
the visualized paranasal sinuses. No mastoid or middle ear effusion.
The orbits are normal.

CT CERVICAL SPINE FINDINGS

Alignment: No static subluxation. Facets are aligned. Occipital
condyles are normally positioned.

Skull base and vertebrae: No acute fracture.

Soft tissues and spinal canal: No prevertebral fluid or swelling. No
visible canal hematoma.

Disc levels: No advanced spinal canal or neural foraminal stenosis.

Upper chest: No pneumothorax, pulmonary nodule or pleural effusion.

Other: Incidentally noted DARDLLY configuration.
IMPRESSION: 1. No acute intracranial abnormality.
2. No acute fracture or static subluxation of the cervical spine.

## 2020-08-22 MED ORDER — LEVALBUTEROL HCL 0.63 MG/3ML IN NEBU: 0.6300 mg | INHALATION_SOLUTION | Freq: Four times a day (QID) | RESPIRATORY_TRACT | Status: DC | PRN

## 2020-08-22 MED ORDER — TRAMADOL HCL 50 MG PO TABS
100.0000 mg | ORAL_TABLET | Freq: Four times a day (QID) | ORAL | Status: DC | PRN
  Administered 2020-08-22 – 2020-08-25 (×6): 100 mg via ORAL
  Filled 2020-08-22 (×6): qty 2

## 2020-08-22 MED ORDER — HEPARIN (PORCINE) 25000 UT/250ML-% IV SOLN
1400.0000 [IU]/h | INTRAVENOUS | Status: AC
  Administered 2020-08-22: 1200 [IU]/h via INTRAVENOUS

## 2020-08-22 MED ORDER — METOPROLOL TARTRATE 50 MG PO TABS
50.0000 mg | ORAL_TABLET | Freq: Two times a day (BID) | ORAL | Status: DC
  Administered 2020-08-22 (×2): 50 mg via ORAL
  Filled 2020-08-22 (×2): qty 1

## 2020-08-22 NOTE — Anesthesia Preprocedure Evaluation (Deleted)
Anesthesia Evaluation    Reviewed: Allergy & Precautions, Patient's Chart, lab work & pertinent test results  Airway        Dental   Pulmonary asthma ,           Cardiovascular negative cardio ROS       Neuro/Psych  Headaches,    GI/Hepatic Neg liver ROS, GERD  Medicated,  Endo/Other  negative endocrine ROS  Renal/GU negative Renal ROS     Musculoskeletal  (+) Arthritis ,   Abdominal (+) + obese,   Peds  Hematology  (+) anemia ,   Anesthesia Other Findings Iron deficiency  Reproductive/Obstetrics                             Anesthesia Physical Anesthesia Plan  ASA:   Anesthesia Plan: MAC   Post-op Pain Management:    Induction:   PONV Risk Score and Plan:   Airway Management Planned:   Additional Equipment:   Intra-op Plan:   Post-operative Plan:   Informed Consent:   Plan Discussed with:   Anesthesia Plan Comments:         Anesthesia Quick Evaluation

## 2020-08-22 NOTE — Consult Note (Signed)
Reason for Consult: Iron deficiency Referring Physician: Hospital team  Tony Roberts is an 79 y.o. male.  HPI: Patient seen and examined in his office computer chart in her hospital computer chart was reviewed and the case discussed with the hospital team as well he does have some reflux and is on both Pepcid and pump inhibitors and does use a fair amount of Aleve at home has no lower bowel complaints and his last work-up was in 2011 for iron deficiency then which was negative and his family history is negative from a GI standpoint and he is not a vegetarian and does not donate blood and was able to be maintained in the past on oral iron has not been on any in a long time has no other complaints  Past Medical History:  Diagnosis Date  . Arthritis   . Asthma    last asthma attack at age 58  . GERD (gastroesophageal reflux disease)   . Headache   . Other specified iron deficiency anemias   . PAC (premature atrial contraction)    occasional PAC's    Past Surgical History:  Procedure Laterality Date  . CATARACT EXTRACTION    . HERNIA REPAIR    . SPINE SURGERY  10/29/2016  . TONSILLECTOMY      Family History  Problem Relation Age of Onset  . Alzheimer's disease Mother   . Asthma Father     Social History:  reports that he has never smoked. He has never used smokeless tobacco. He reports that he does not drink alcohol and does not use drugs.  Allergies: No Known Allergies  Medications: I have reviewed the patient's current medications.  Results for orders placed or performed during the hospital encounter of 08/21/20 (from the past 48 hour(s))  Comprehensive metabolic panel     Status: Abnormal   Collection Time: 08/21/20 11:05 AM  Result Value Ref Range   Sodium 139 135 - 145 mmol/L   Potassium 3.4 (L) 3.5 - 5.1 mmol/L   Chloride 105 98 - 111 mmol/L   CO2 21 (L) 22 - 32 mmol/L   Glucose, Bld 135 (H) 70 - 99 mg/dL    Comment: Glucose reference range applies only to samples  taken after fasting for at least 8 hours.   BUN 10 8 - 23 mg/dL   Creatinine, Ser 1.10 0.61 - 1.24 mg/dL   Calcium 8.8 (L) 8.9 - 10.3 mg/dL   Total Protein 6.3 (L) 6.5 - 8.1 g/dL   Albumin 3.5 3.5 - 5.0 g/dL   AST 26 15 - 41 U/L   ALT 20 0 - 44 U/L   Alkaline Phosphatase 64 38 - 126 U/L   Total Bilirubin 1.4 (H) 0.3 - 1.2 mg/dL   GFR, Estimated >60 >60 mL/min    Comment: (NOTE) Calculated using the CKD-EPI Creatinine Equation (2021)    Anion gap 13 5 - 15    Comment: Performed at [facility], [place] [address], [place], [place] [address]  Troponin I (High Sensitivity)     Status: None   Collection Time: 08/21/20 11:05 AM  Result Value Ref Range   Troponin I (High Sensitivity) 10 <18 ng/L    Comment: (NOTE) Elevated high sensitivity troponin I (hsTnI) values and significant  changes across serial measurements may suggest ACS but many other  chronic and acute conditions are known to elevate hsTnI results.  Refer to the "Links" section for chest pain algorithms and additional  guidance. Performed at [facility]  [facility], [place] [address], [place], [place] [address]   CBC with Differential     Status: Abnormal   Collection Time: 08/21/20 11:05 AM  Result Value Ref Range   WBC 7.2 4.0 - 10.5 K/uL   RBC 4.74 4.22 - 5.81 MIL/uL   Hemoglobin 11.6 (L) 13.0 - 17.0 g/dL   HCT 38.1 (L) 39 - 52 %   MCV 80.4 80.0 - 100.0 fL   MCH 24.5 (L) 26.0 - 34.0 pg   MCHC 30.4 30.0 - 36.0 g/dL   RDW 16.2 (H) 11.5 - 15.5 %   Platelets 152 150 - 400 K/uL   nRBC 0.0 0.0 - 0.2 %   Neutrophils Relative % 74 %   Neutro Abs 5.3 1.7 - 7.7 K/uL   Lymphocytes Relative 13 %   Lymphs Abs 1.0 0.7 - 4.0 K/uL   Monocytes Relative 9 %   Monocytes Absolute 0.6 0.1 - 1.0 K/uL   Eosinophils Relative 3 %   Eosinophils Absolute 0.2 0.0 - 0.5 K/uL   Basophils Relative 1 %   Basophils Absolute 0.1 0.0 - 0.1 K/uL   Immature Granulocytes 0 %   Abs Immature Granulocytes 0.03 0.00 - 0.07 K/uL    Comment: Performed  at [facility], [address], [place], [place] [address]  Brain natriuretic peptide     Status: Abnormal   Collection Time: 08/21/20 11:27 AM  Result Value Ref Range   B Natriuretic Peptide 328.4 (H) 0.0 - 100.0 pg/mL    Comment: Performed at [facility] [address], [place], [place] [address]  Troponin I (High Sensitivity)     Status: None   Collection Time: 08/21/20  1:43 PM  Result Value Ref Range   Troponin I (High Sensitivity) 10 <18 ng/L    Comment: (NOTE) Elevated high sensitivity troponin I (hsTnI) values and significant  changes across serial measurements may suggest ACS but many other  chronic and acute conditions are known to elevate hsTnI results.  Refer to the "Links" section for chest pain algorithms and additional  guidance. Performed at [facility], [place] [address], [place], [place] [address]   TSH     Status: None   Collection Time: 08/21/20  1:43 PM  Result Value Ref Range   TSH 4.301 0.350 - 4.500 uIU/mL    Comment: Performed by a 3rd Generation assay with a functional sensitivity of <=0.01 uIU/mL. Performed at [facility], [place] [address], [place], [place] [address]   Resp Panel by RT-PCR (Flu A&B, Covid) Nasopharyngeal Swab     Status: None   Collection Time: 08/21/20  2:18 PM   Specimen: Nasopharyngeal Swab; Nasopharyngeal(NP) swabs in vial transport medium  Result Value Ref Range   SARS Coronavirus 2 by RT PCR NEGATIVE NEGATIVE    Comment: (NOTE) SARS-CoV-2 target nucleic acids are NOT DETECTED.  The SARS-CoV-2 RNA is generally detectable in upper respiratory specimens during the acute phase of infection. The lowest concentration of SARS-CoV-2 viral copies this assay can detect is 138 copies/mL. A negative result does not preclude SARS-Cov-2 infection and should not be used as the sole basis for treatment or other patient management decisions. A negative result may occur with  improper specimen collection/handling,  submission of specimen other than nasopharyngeal swab, presence of viral mutation(s) within the areas targeted by this assay, and inadequate number of viral copies(<138 copies/mL). A negative result must be combined with clinical observations, patient history, and epidemiological  information. The expected result is Negative.  Fact Sheet for Patients:  EntrepreneurPulse.com.au  Fact Sheet for Healthcare Providers:  IncredibleEmployment.be  This test is no t yet approved or cleared by the [place] FDA and  has been authorized for detection and/or diagnosis of SARS-CoV-2 by FDA under an Emergency Use Authorization (EUA). This EUA will remain  in effect (meaning this test can be used) for the duration of the COVID-19 declaration under Section 564(b)(1) of the Act, 21 U.S.C.section 360bbb-3(b)(1), unless the authorization is terminated  or revoked sooner.       Influenza A by PCR NEGATIVE NEGATIVE   Influenza B by PCR NEGATIVE NEGATIVE    Comment: (NOTE) The Xpert Xpress SARS-CoV-2/FLU/RSV plus assay is intended as an aid in the diagnosis of influenza from Nasopharyngeal swab specimens and should not be used as a sole basis for treatment. Nasal washings and aspirates are unacceptable for Xpert Xpress SARS-CoV-2/FLU/RSV testing.  Fact Sheet for Patients: EntrepreneurPulse.com.au  Fact Sheet for Healthcare Providers: IncredibleEmployment.be  This test is not yet approved or cleared by the [place] FDA and has been authorized for detection and/or diagnosis of SARS-CoV-2 by FDA under an Emergency Use Authorization (EUA). This EUA will remain in effect (meaning this test can be used) for the duration of the COVID-19 declaration under Section 564(b)(1) of the Act, 21 U.S.C. section 360bbb-3(b)(1), unless the authorization is terminated or revoked.  Performed at [facility], [place] [address]  [place], [place] [address]   Magnesium     Status: None   Collection Time: 08/21/20  4:29 PM  Result Value Ref Range   Magnesium 2.1 1.7 - 2.4 mg/dL    Comment: Performed at [facility], [place] [address] [place], [place] [address]  Phosphorus     Status: None   Collection Time: 08/21/20  4:29 PM  Result Value Ref Range   Phosphorus 3.3 2.5 - 4.6 mg/dL    Comment: Performed at [facility] [address] [place], [place] [address]  Hemoglobin A1c     Status: Abnormal   Collection Time: 08/21/20  4:29 PM  Result Value Ref Range   Hgb A1c MFr Bld 5.8 (H) 4.8 - 5.6 %    Comment: (NOTE) Pre diabetes:          5.7%-6.4%  Diabetes:              >6.4%  Glycemic control for   <7.0% adults with diabetes    Mean Plasma Glucose 119.76 mg/dL    Comment: Performed at [facility] [address], [place], [place] [address]  Lipid panel     Status: Abnormal   Collection Time: 08/21/20  4:29 PM  Result Value Ref Range   Cholesterol 187 0 - 200 mg/dL   Triglycerides 64 <150 mg/dL   HDL 37 (L) >40 mg/dL   Total CHOL/HDL Ratio 5.1 RATIO   VLDL 13 0 - 40 mg/dL   LDL Cholesterol 137 (H) 0 - 99 mg/dL    Comment:        Total Cholesterol/HDL:CHD Risk Coronary Heart Disease Risk Table                     Men   Women  1/2 Average Risk   3.4   3.3  Average Risk       5.0   4.4  2 X Average Risk   9.6   7.1  3 X Average  Risk  23.4   11.0        Use the calculated Patient Ratio above and the CHD Risk Table to determine the patient's CHD Risk.        ATP III CLASSIFICATION (LDL):  <100     mg/dL   Optimal  100-129  mg/dL   Near or Above                    Optimal  130-159  mg/dL   Borderline  160-189  mg/dL   High  >190     mg/dL   Very High Performed at [facility] [address], [place], [place] [address]   Iron and TIBC     Status: Abnormal   Collection Time: 08/21/20  4:29 PM  Result Value Ref Range   Iron 23 (L) 45 - 182 ug/dL   TIBC 416 250 - 450 ug/dL    Saturation Ratios 6 (L) 17.9 - 39.5 %   UIBC 393 ug/dL    Comment: Performed at [facility], [place] [address], [place], [place] [address]  Ferritin     Status: Abnormal   Collection Time: 08/21/20  4:29 PM  Result Value Ref Range   Ferritin 8 (L) 24 - 336 ng/mL    Comment: Performed at [facility], [place] [address] [place], [place] [address]  Heparin level (unfractionated)     Status: Abnormal   Collection Time: 08/22/20  1:04 AM  Result Value Ref Range   Heparin Unfractionated <0.10 (L) 0.30 - 0.70 IU/mL    Comment: (NOTE) If heparin results are below expected values, and patient dosage has  been confirmed, suggest follow up testing of antithrombin III levels. Performed at [facility], [place] [address] [place], [place] [address]   CBC     Status: Abnormal   Collection Time: 08/22/20  1:04 AM  Result Value Ref Range   WBC 6.6 4.0 - 10.5 K/uL   RBC 4.65 4.22 - 5.81 MIL/uL   Hemoglobin 11.5 (L) 13.0 - 17.0 g/dL   HCT 36.3 (L) 39 - 52 %   MCV 78.1 (L) 80.0 - 100.0 fL   MCH 24.7 (L) 26.0 - 34.0 pg   MCHC 31.7 30.0 - 36.0 g/dL   RDW 16.3 (H) 11.5 - 15.5 %   Platelets 168 150 - 400 K/uL   nRBC 0.0 0.0 - 0.2 %    Comment: Performed at [facility], [place] [address], [place], [place] [address]  Basic metabolic panel     Status: Abnormal   Collection Time: 08/22/20  1:04 AM  Result Value Ref Range   Sodium 141 135 - 145 mmol/L   Potassium 3.8 3.5 - 5.1 mmol/L   Chloride 105 98 - 111 mmol/L   CO2 24 22 - 32 mmol/L   Glucose, Bld 121 (H) 70 - 99 mg/dL    Comment: Glucose reference range applies only to samples taken after fasting for at least 8 hours.   BUN 9 8 - 23 mg/dL   Creatinine, Ser 1.09 0.61 - 1.24 mg/dL   Calcium 9.0 8.9 - 10.3 mg/dL   GFR, Estimated >60 >60 mL/min    Comment: (NOTE) Calculated using the CKD-EPI Creatinine Equation (2021)    Anion gap 12 5 - 15    Comment: Performed at [facility] [address] [place], [place] [address]   Heparin level (unfractionated)     Status:  Abnormal   Collection Time: 08/22/20 11:35 AM  Result Value Ref Range   Heparin Unfractionated 0.12 (L) 0.30 - 0.70 IU/mL    Comment: (NOTE) If heparin results are below expected values, and patient dosage has  been confirmed, suggest follow up testing of antithrombin III levels. Performed at [facility], [place] [address], [place], [place] [address]     DG Chest Port 1 View  Result Date: 08/21/2020 CLINICAL DATA:  Shortness of breath and palpitation EXAM: PORTABLE CHEST 1 VIEW COMPARISON:  10/23/2016 FINDINGS: Cardiomegaly and aortic tortuosity. Cardiomegaly appears new/progressed. Suspect small right pleural effusion. Mild interstitial prominence that is similar to prior. No pneumothorax. IMPRESSION: 1. Cardiopericardial enlargement. 2. Small right pleural effusion. Electronically Signed   By: Monte Fantasia M.D.   On: 08/21/2020 11:37    Review of Systems negative except above he has been having TIAs and cardiology would like a GI work-up before starting blood thinners Blood pressure (!) 129/96, pulse (!) 101, temperature 97.8 F (36.6 C), temperature source Oral, resp. rate 18, height 5\' 5"  (1.651 m), weight 84.9 kg, SpO2 93 %. Physical Exam vital signs stable afebrile no acute distress exam pertinent for his abdomen being soft nontender labs reviewed  Assessment/Plan: Iron deficiency small etiology Plan: The risk benefits methods of endoscopy was discussed with the patient and will proceed tomorrow and if negative okay with me to use blood thinners and if he has signs of ongoing blood loss need further work-up at that time and patient agrees with the plan  [facility] E 08/22/2020, 2:47 PM

## 2020-08-22 NOTE — Progress Notes (Signed)
Assessed patient at bedside. Reports that he got out of bed in his socks and his feet slipped out from under him. He landed on L shoulder. Acute deformity of left shoulder - Xray pending. No tenderness in knees or hips. No change in vision, hearing, speech, facial symmetry, strength/sensation of extremities. Neuro checks ordered at midnight and 4am. CT head and Cervical spine pending. Minor abrasion over left eye. Will continue to monitor.

## 2020-08-22 NOTE — Progress Notes (Signed)
[place] for IV Heparin Indication: atrial fibrillation and recent TIAs  No Known Allergies  Patient Measurements: Weight 89.8 kg (from 08/19/20) Hight 5'5 (from 08/19/20) Heparin Dosing Weight: 81 kg  Vital Signs: Temp: 98 F (36.7 C) (12/01 2326) Temp Source: Oral (12/01 2326) BP: 122/86 (12/01 2326) Pulse Rate: 100 (12/01 2326)  Labs: Recent Labs    08/21/20 1105 08/21/20 1343 08/22/20 0104  HGB 11.6*  --  11.5*  HCT 38.1*  --  36.3*  PLT 152  --  168  HEPARINUNFRC  --   --  <0.10*  CREATININE 1.10  --  1.09  TROPONINIHS 10 10  --     Estimated Creatinine Clearance: 56.6 mL/min (by C-G formula based on SCr of 1.09 mg/dL).   Medical History: Past Medical History:  Diagnosis Date  . Arthritis   . Asthma    last asthma attack at age 72  . GERD (gastroesophageal reflux disease)   . Headache   . Other specified iron deficiency anemias   . PAC (premature atrial contraction)    occasional PAC's    Medications:  Medications Prior to Admission  Medication Sig Dispense Refill Last Dose  . ADVAIR DISKUS 250-50 MCG/DOSE AEPB INHALE 1 PUFF BY MOUTH ONCE TO TWICE A DAY TO PREVENT COUGH OR WHEEZE. RINSE, GARGLE, AND SPIT AFTER USE. (Patient taking differently: Inhale 1 puff into the lungs See admin instructions. Inhale 1 puff into the lungs at bedtime and an additional 1 puff in the morning as needed for coughing, wheezing, or shortness of breath) 180 each 0 08/20/2020 at Unknown time  . albuterol (PROVENTIL HFA) 108 (90 Base) MCG/ACT inhaler Inhale two puffs every four to six hours as needed for cough or wheeze. (Patient taking differently: Inhale 2 puffs into the lungs See admin instructions. Inhale 2 puffs into the lungs every four to six hours as needed for coughing or wheezing) 1 Inhaler 1 unk  . amLODipine (NORVASC) 5 MG tablet Take 5 mg by mouth at bedtime.   11 08/20/2020 at pm  . esomeprazole (NEXIUM) 40 MG capsule Take 40 mg  by mouth See admin instructions. Take 40 mg by mouth in the morning before breakfast and an additional 40 mg once a day as needed for recurring heartburn   08/21/2020 at am  . famotidine (PEPCID) 40 MG tablet Take 40 mg by mouth at bedtime.    08/19/2020 at pm  . montelukast (SINGULAIR) 10 MG tablet TAKE 1 TABLET (10 MG TOTAL) BY MOUTH AT BEDTIME. 90 tablet 0 08/20/2020 at pm  . triamcinolone (NASACORT ALLERGY 24HR) 55 MCG/ACT AERO nasal inhaler Place 1 spray into the nose daily as needed (for allergies or rhinitis).    unk  . omeprazole (PRILOSEC) 40 MG capsule Take 1 capsule (40 mg total) by mouth in the morning and at bedtime. (Patient not taking: Reported on 08/21/2020) 60 capsule 5 Not Taking at Unknown time    Assessment: 79 years of age male with history of several TIAs in the last month and admitted with atrial fibrillation to start IV Heparin per pharmacy dosing. Hgb/Hct are stable from previous labs. Platelets are stable at 152. No bleeding reported. Patient was not on an anticoagulant prior to admission.   12/2 AM update:  Initial heparin level low Hgb stable  Goal of Therapy:  Heparin level 0.3 to 0.5 units/ml  - lower goal due to multiple recent TIAs Monitor platelets by anticoagulation protocol: Yes   Plan:  Inc heparin to 1200 units/hr Heparin level to be checked in 8 hours.  Daily heparin level and CBC while on therapy.   Narda Bonds, PharmD, BCPS Clinical Pharmacist Phone: [phone]

## 2020-08-22 NOTE — Progress Notes (Signed)
  Echocardiogram 2D Echocardiogram has been attempted. Patient HR 110-125. Will reattempt at later time when patient HR is less than 100 BPM.  Tony Roberts 08/22/2020, 9:48 AM

## 2020-08-22 NOTE — CV Procedure (Signed)
2D echo attempted again. HR still not under 100 BPM. Will try again when hr is below 100bpm.

## 2020-08-22 NOTE — Progress Notes (Signed)
Notified MD that patient fell he has visible injury of the left shoulder requested Xray it was done pending results.He did break his eyeglasses with a little abrasion to left eyelid. Orders also for CT head waiting to be done. Prior to fall he seemed to have some confusion or sundown syndrome taking apart IV and heart monitor and saying he was going to walk to the kitchen refused to sit or laydown. I explained he was in the hospital room for the night and that is when he yelled at me and pulled his IV pole and turned away then falling. After the fall he was cooperative and fine. Vitals stable and MD came to bedside to assess patient. I ask the patient if I could call his wife with an update of fall he said no she will panic. I ask if can call children he said he didn't have any and not to make any calls. I gave ice pack and ultram to help with pain to left shoulder, he denies pain to any other places. Neuro intact at this time no deficits . I will continue to monitor.

## 2020-08-22 NOTE — Progress Notes (Signed)
PROGRESS NOTE    Tony Roberts  LFY:[id number] DOB: 07/21/1941 DOA: 08/21/2020 PCP: Alroy Dust, L.Marlou Sa, MD    Brief Narrative:  Tony Roberts is a 79 y.o. male with medical history significant of HTN, GERD, asthma, diverticulosis, presented with new onset of palpitations.  Symptoms started 4 to 5 days ago, with persistent palpitations, exertional shortness of breath and worsening of cough (chronic cough secondary to asthma, for which patient been following with allergist).  Patient went to see allergist 3 days ago, was found in rapid A. fib.  Denied any chest pain, no leg swelling no fever chills weight loss.Before this, patient has had 3 TIAs in last 1 month.  Each episode last about 30 minutes, symptoms was one-sided numbness and facial droop and speech problems.  Symptoms resolved spontaneously.  Patient went to see PCP after the first episode, and MRI was done on 11/15, with no acute stroke but tiny chronic infarct suspected in left cerebellum.   12/2-tele afib rvr HR 110'-120  Consultants:   cardiology  Procedures:   Antimicrobials:       Subjective: Sitting in chair, currently asymptomatic even though he is tachycardic on telemetry.  No chest pain  Objective: Vitals:   08/22/20 0303 08/22/20 0326 08/22/20 0400 08/22/20 0709  BP: (!) 116/95  (!) 121/99 (!) 119/95  Pulse: (!) 104 (!) 17 (!) 112 (!) 116  Resp: 19 19 20 17   Temp: 98.1 F (36.7 C)   97.7 F (36.5 C)  TempSrc: Oral   Oral  SpO2: 94%  94% 92%  Weight: 84.9 kg       Intake/Output Summary (Last 24 hours) at 08/22/2020 0810 Last data filed at 08/22/2020 0610 Gross per 24 hour  Intake 406.42 ml  Output 2325 ml  Net -1918.58 ml   Filed Weights   08/22/20 0303  Weight: 84.9 kg    Examination:  General exam: Appears calm and comfortable  Respiratory system: Clear to auscultation. Respiratory effort normal. Cardiovascular system: S1 & S2 heard, irregular, tachycardic no JVD, murmurs, rubs, gallops or  clicks.  Gastrointestinal system: Abdomen is nondistended, soft and nontender. Normal bowel sounds heard. Central nervous system: Alert and oriented.  Grossly intact Extremities: No edema Skin: Warm dry Psychiatry: Judgement and insight appear normal. Mood & affect appropriate.     Data Reviewed: I have personally reviewed following labs and imaging studies  CBC: Recent Labs  Lab 08/21/20 1105 08/22/20 0104  WBC 7.2 6.6  NEUTROABS 5.3  --   HGB 11.6* 11.5*  HCT 38.1* 36.3*  MCV 80.4 78.1*  PLT 152 852   Basic Metabolic Panel: Recent Labs  Lab 08/21/20 1105 08/21/20 1629 08/22/20 0104  NA 139  --  141  K 3.4*  --  3.8  CL 105  --  105  CO2 21*  --  24  GLUCOSE 135*  --  121*  BUN 10  --  9  CREATININE 1.10  --  1.09  CALCIUM 8.8*  --  9.0  MG  --  2.1  --   PHOS  --  3.3  --    GFR: Estimated Creatinine Clearance: 55.1 mL/min (by C-G formula based on SCr of 1.09 mg/dL). Liver Function Tests: Recent Labs  Lab 08/21/20 1105  AST 26  ALT 20  ALKPHOS 64  BILITOT 1.4*  PROT 6.3*  ALBUMIN 3.5   No results for input(s): LIPASE, AMYLASE in the last 168 hours. No results for input(s): AMMONIA in the last 168 hours.  Coagulation Profile: No results for input(s): INR, PROTIME in the last 168 hours. Cardiac Enzymes: No results for input(s): CKTOTAL, CKMB, CKMBINDEX, TROPONINI in the last 168 hours. BNP (last 3 results) No results for input(s): PROBNP in the last 8760 hours. HbA1C: Recent Labs    08/21/20 1629  HGBA1C 5.8*   CBG: No results for input(s): GLUCAP in the last 168 hours. Lipid Profile: Recent Labs    08/21/20 1629  CHOL 187  HDL 37*  LDLCALC 137*  TRIG 64  CHOLHDL 5.1   Thyroid Function Tests: Recent Labs    08/21/20 1343  TSH 4.301   Anemia Panel: Recent Labs    08/21/20 1629  FERRITIN 8*  TIBC 416  IRON 23*   Sepsis Labs: No results for input(s): PROCALCITON, LATICACIDVEN in the last 168 hours.  Recent Results (from the  past 240 hour(s))  Resp Panel by RT-PCR (Flu A&B, Covid) Nasopharyngeal Swab     Status: None   Collection Time: 08/21/20  2:18 PM   Specimen: Nasopharyngeal Swab; Nasopharyngeal(NP) swabs in vial transport medium  Result Value Ref Range Status   SARS Coronavirus 2 by RT PCR NEGATIVE NEGATIVE Final    Comment: (NOTE) SARS-CoV-2 target nucleic acids are NOT DETECTED.  The SARS-CoV-2 RNA is generally detectable in upper respiratory specimens during the acute phase of infection. The lowest concentration of SARS-CoV-2 viral copies this assay can detect is 138 copies/mL. A negative result does not preclude SARS-Cov-2 infection and should not be used as the sole basis for treatment or other patient management decisions. A negative result may occur with  improper specimen collection/handling, submission of specimen other than nasopharyngeal swab, presence of viral mutation(s) within the areas targeted by this assay, and inadequate number of viral copies(<138 copies/mL). A negative result must be combined with clinical observations, patient history, and epidemiological information. The expected result is Negative.  Fact Sheet for Patients:  EntrepreneurPulse.com.au  Fact Sheet for Healthcare Providers:  IncredibleEmployment.be  This test is no t yet approved or cleared by the [place] FDA and  has been authorized for detection and/or diagnosis of SARS-CoV-2 by FDA under an Emergency Use Authorization (EUA). This EUA will remain  in effect (meaning this test can be used) for the duration of the COVID-19 declaration under Section 564(b)(1) of the Act, 21 U.S.C.section 360bbb-3(b)(1), unless the authorization is terminated  or revoked sooner.       Influenza A by PCR NEGATIVE NEGATIVE Final   Influenza B by PCR NEGATIVE NEGATIVE Final    Comment: (NOTE) The Xpert Xpress SARS-CoV-2/FLU/RSV plus assay is intended as an aid in the diagnosis of  influenza from Nasopharyngeal swab specimens and should not be used as a sole basis for treatment. Nasal washings and aspirates are unacceptable for Xpert Xpress SARS-CoV-2/FLU/RSV testing.  Fact Sheet for Patients: EntrepreneurPulse.com.au  Fact Sheet for Healthcare Providers: IncredibleEmployment.be  This test is not yet approved or cleared by the [place] FDA and has been authorized for detection and/or diagnosis of SARS-CoV-2 by FDA under an Emergency Use Authorization (EUA). This EUA will remain in effect (meaning this test can be used) for the duration of the COVID-19 declaration under Section 564(b)(1) of the Act, 21 U.S.C. section 360bbb-3(b)(1), unless the authorization is terminated or revoked.  Performed at [facility], [place] [address], [place], [place] [address]          Radiology Studies: DG Chest Port 1 View  Result Date: 08/21/2020 CLINICAL DATA:  Shortness of breath  and palpitation EXAM: PORTABLE CHEST 1 VIEW COMPARISON:  10/23/2016 FINDINGS: Cardiomegaly and aortic tortuosity. Cardiomegaly appears new/progressed. Suspect small right pleural effusion. Mild interstitial prominence that is similar to prior. No pneumothorax. IMPRESSION: 1. Cardiopericardial enlargement. 2. Small right pleural effusion. Electronically Signed   By: Monte Fantasia M.D.   On: 08/21/2020 11:37        Scheduled Meds: . aspirin EC  81 mg Oral Daily  . famotidine  40 mg Oral QHS  . fluticasone furoate-vilanterol  1 puff Inhalation Daily  . ipratropium  2 puff Inhalation Q4H  . metoprolol tartrate  25 mg Oral BID  . montelukast  10 mg Oral QHS  . pantoprazole  40 mg Oral Daily  . sodium chloride flush  3 mL Intravenous Q12H   Continuous Infusions: . sodium chloride    . diltiazem (CARDIZEM) infusion 15 mg/hr (08/22/20 0610)  . heparin 1,200 Units/hr (08/22/20 0610)    Assessment & Plan:   Active Problems:   Atrial fibrillation  with rapid ventricular response (HCC)   New onset A. fib with RVR -Heart rate still not well controlled.   Cardiology following -increase metoprolol to 50 twice daily.  Still on Cardizem drip on 15 mg/h.   If cannot get rate controlled may need to add amiodarone  For long-term anticoagulation cardiology will like GI to address anemia.   If cannot get rate control will need 3 to 4 weeks on Eliquis with no missed doses prior to DCCV If cannot get rate control then will need TEE/DCCV prior to discharge    Hypokalemia Supplemented and stable  TIAs -recent -MRI -2 weeks ago. -Likely related to A. fib RVR LDL goal <70  Chronic normocytic-microcytic anemia Fe 23,TIBC 416, Ferritin 8 Will contact GI since last scope was 8 yrs ago per pt. Also as we need to start Eliquis but need GI clearance. Will discuss iron infusion   Asthma, mild intermittent No exacerbation. Continue inhalers  DVT prophylaxis: Heparin drip Code Status: Full Family Communication: None at bedside  Status is: Inpatient  Remains inpatient appropriate because:IV treatments appropriate due to intensity of illness or inability to take PO   Dispo: The patient is from: Home              Anticipated d/c is to: Home              Anticipated d/c date is: 2 days              Patient currently is not medically stable to d/c.            LOS: 1 day   Time spent: 35 minutes with more than 50% on [place], MD [facility] Pager [phone]  If 7PM-7AM, please contact night-coverage [email or web address] Password [email or web address] 08/22/2020, 8:10 AM

## 2020-08-22 NOTE — Progress Notes (Signed)
[place] for IV Heparin Indication: atrial fibrillation and recent TIAs  No Known Allergies  Patient Measurements: Weight 89.8 kg (from 08/19/20) Hight 5'5 (from 08/19/20) Heparin Dosing Weight: 81 kg  Vital Signs: Temp: 97.8 F (36.6 C) (12/02 1155) Temp Source: Oral (12/02 1155) BP: 129/96 (12/02 1155) Pulse Rate: 101 (12/02 1155)  Labs: Recent Labs    08/21/20 1105 08/21/20 1343 08/22/20 0104 08/22/20 1135  HGB 11.6*  --  11.5*  --   HCT 38.1*  --  36.3*  --   PLT 152  --  168  --   HEPARINUNFRC  --   --  <0.10* 0.12*  CREATININE 1.10  --  1.09  --   TROPONINIHS 10 10  --   --     Estimated Creatinine Clearance: 55.1 mL/min (by C-G formula based on SCr of 1.09 mg/dL).   Medical History: Past Medical History:  Diagnosis Date  . Arthritis   . Asthma    last asthma attack at age 58  . GERD (gastroesophageal reflux disease)   . Headache   . Other specified iron deficiency anemias   . PAC (premature atrial contraction)    occasional PAC's    Medications:  Medications Prior to Admission  Medication Sig Dispense Refill Last Dose  . ADVAIR DISKUS 250-50 MCG/DOSE AEPB INHALE 1 PUFF BY MOUTH ONCE TO TWICE A DAY TO PREVENT COUGH OR WHEEZE. RINSE, GARGLE, AND SPIT AFTER USE. (Patient taking differently: Inhale 1 puff into the lungs See admin instructions. Inhale 1 puff into the lungs at bedtime and an additional 1 puff in the morning as needed for coughing, wheezing, or shortness of breath) 180 each 0 08/20/2020 at Unknown time  . albuterol (PROVENTIL HFA) 108 (90 Base) MCG/ACT inhaler Inhale two puffs every four to six hours as needed for cough or wheeze. (Patient taking differently: Inhale 2 puffs into the lungs See admin instructions. Inhale 2 puffs into the lungs every four to six hours as needed for coughing or wheezing) 1 Inhaler 1 unk  . amLODipine (NORVASC) 5 MG tablet Take 5 mg by mouth at bedtime.   11 08/20/2020 at pm  .  esomeprazole (NEXIUM) 40 MG capsule Take 40 mg by mouth See admin instructions. Take 40 mg by mouth in the morning before breakfast and an additional 40 mg once a day as needed for recurring heartburn   08/21/2020 at am  . famotidine (PEPCID) 40 MG tablet Take 40 mg by mouth at bedtime.    08/19/2020 at pm  . montelukast (SINGULAIR) 10 MG tablet TAKE 1 TABLET (10 MG TOTAL) BY MOUTH AT BEDTIME. 90 tablet 0 08/20/2020 at pm  . triamcinolone (NASACORT ALLERGY 24HR) 55 MCG/ACT AERO nasal inhaler Place 1 spray into the nose daily as needed (for allergies or rhinitis).    unk  . omeprazole (PRILOSEC) 40 MG capsule Take 1 capsule (40 mg total) by mouth in the morning and at bedtime. (Patient not taking: Reported on 08/21/2020) 60 capsule 5 Not Taking at Unknown time    Assessment: 79 years of age male with history of several TIAs in the last month and admitted with atrial fibrillation to start IV Heparin per pharmacy dosing. Hgb/Hct are stable from previous labs. Platelets are stable at 152. No bleeding reported. Patient was not on an anticoagulant prior to admission.   Heparin level still low this afternoon, plans for endoscopy tomorrow. GI notes no ongoing bleeding so anticoagulation is ok for now.  Will adjust heparin and check level prior to turning off at 0300.   Goal of Therapy:  Heparin level 0.3 to 0.5 units/ml  - lower goal due to multiple recent TIAs Monitor platelets by anticoagulation protocol: Yes   Plan:  Increase heparin to 1400 units/hr Heparin level to be checked in am prior to turning off infusion for endoscopy 12/3  Erin Hearing PharmD., BCPS Clinical Pharmacist 08/22/2020 3:38 PM

## 2020-08-22 NOTE — Progress Notes (Signed)
Pt c/o chest discomfort 2/10. V/S stable.EKG done. Dr Clearence Ped informed. Tylenol 650 mg po given as ordered. Will continue to monitor pt.

## 2020-08-22 NOTE — Progress Notes (Addendum)
Progress Note  Patient Name: Tony Roberts Date of Encounter: 08/22/2020  [facility] Cardiologist: NEW  Subjective   Feels fine today.  Had some pain in his ribs yesterday with movement that resolved with Tylenol.  Still in afib with RVR in the 120's  Inpatient Medications    Scheduled Meds: . aspirin EC  81 mg Oral Daily  . famotidine  40 mg Oral QHS  . fluticasone furoate-vilanterol  1 puff Inhalation Daily  . ipratropium  2 puff Inhalation Q4H  . metoprolol tartrate  25 mg Oral BID  . montelukast  10 mg Oral QHS  . pantoprazole  40 mg Oral Daily  . sodium chloride flush  3 mL Intravenous Q12H   Continuous Infusions: . sodium chloride    . diltiazem (CARDIZEM) infusion 15 mg/hr (08/22/20 0610)  . heparin 1,200 Units/hr (08/22/20 0610)   PRN Meds: sodium chloride, acetaminophen, ondansetron (ZOFRAN) IV, sodium chloride flush, triamcinolone   Vital Signs    Vitals:   08/22/20 0326 08/22/20 0400 08/22/20 0709 08/22/20 0832  BP:  (!) 121/99 (!) 119/95   Pulse: (!) 17 (!) 112 (!) 116   Resp: 19 20 17    Temp:   97.7 F (36.5 C)   TempSrc:   Oral   SpO2:  94% 92% 95%  Weight:        Intake/Output Summary (Last 24 hours) at 08/22/2020 0915 Last data filed at 08/22/2020 0610 Gross per 24 hour  Intake 406.42 ml  Output 2325 ml  Net -1918.58 ml   Last 3 Weights 08/22/2020 08/19/2020 01/23/2020  Weight (lbs) 187 lb 2.7 oz 198 lb 190 lb 6.4 oz  Weight (kg) 84.9 kg 89.812 kg 86.365 kg      Telemetry    Atrial fibrillation in the 120's - Personally Reviewed  ECG    Atrial fibrillation at 108bpm - Personally Reviewed  Physical Exam   GEN: No acute distress.   Neck: No JVD Cardiac: irregularly irregular and tachy, no murmurs, rubs, or gallops.  Respiratory: Clear to auscultation bilaterally. GI: Soft, nontender, non-distended  MS: No edema; No deformity. Neuro:  Nonfocal  Psych: Normal affect   Labs    High Sensitivity Troponin:   Recent Labs  Lab  08/21/20 1105 08/21/20 1343  TROPONINIHS 10 10      Chemistry Recent Labs  Lab 08/21/20 1105 08/22/20 0104  NA 139 141  K 3.4* 3.8  CL 105 105  CO2 21* 24  GLUCOSE 135* 121*  BUN 10 9  CREATININE 1.10 1.09  CALCIUM 8.8* 9.0  PROT 6.3*  --   ALBUMIN 3.5  --   AST 26  --   ALT 20  --   ALKPHOS 64  --   BILITOT 1.4*  --   GFRNONAA >60 >60  ANIONGAP 13 12     Hematology Recent Labs  Lab 08/21/20 1105 08/22/20 0104  WBC 7.2 6.6  RBC 4.74 4.65  HGB 11.6* 11.5*  HCT 38.1* 36.3*  MCV 80.4 78.1*  MCH 24.5* 24.7*  MCHC 30.4 31.7  RDW 16.2* 16.3*  PLT 152 168    BNP Recent Labs  Lab 08/21/20 1127  BNP 328.4*     DDimer No results for input(s): DDIMER in the last 168 hours.    CHA2DS2-VASc Score = 5  This indicates a 7.2% annual risk of stroke. The patient's score is based upon:   Radiology    DG Chest Port 1 View  Result Date: 08/21/2020 CLINICAL DATA:  Shortness of breath and palpitation EXAM: PORTABLE CHEST 1 VIEW COMPARISON:  10/23/2016 FINDINGS: Cardiomegaly and aortic tortuosity. Cardiomegaly appears new/progressed. Suspect small right pleural effusion. Mild interstitial prominence that is similar to prior. No pneumothorax. IMPRESSION: 1. Cardiopericardial enlargement. 2. Small right pleural effusion. Electronically Signed   By: Monte Fantasia M.D.   On: 08/21/2020 11:37    Cardiac Studies   none  Patient Profile     79 y.o. male with a hx of HTN and GERD but no prior cardiac disease.  Recently he has experienced what was felt to be TIAs on 3 separate occasions from October to 08/02/2020 with normal MRI of the brain.  He recently started having increased cough and DOE and was seen by his allergist in the office 3 days ago and was told to see his PCP or ER but never went until today.  In ER today he felt palpitations and was found to be in afib with RVR.    Assessment & Plan    1.  Atrial fibrillation with RVR -new dx for him and unknown  duration -TSH normal -started on IV Cardizem gtt and now on 15mg /hr and HR still in the 120's -BP 119/7mmHg -increase Lopressor to 50mg  BID -if cannot get rate controlled may need to add Amio -continue on IV Heparin gtt for now as iron panel came back low with Iron 23, Fe sat 6 and ferritin 8>>needs GI to address before we start on Eliquis given hx of Anemia in the past as low as 5 per patient -once cleared by GI will start Eliquis 5mg  BID for CHADS2VASC score of 5 -if we can get rate controlled then would need 3-4 weeks of Eliquis with no missed doses prior to DCCV -if we cannot get rate controlled then will need TEE/DCCV prior to discharge  2.  TIAs -he has had several TIAs in the past 2 months and likely related to #1 -continue Eliquis  3.  Cardiomegaly -noted on Cxray -check 2D echo -received 1 dose of IV Lasix yesterday and put out 2.3L and is net neg 1.9L -he does not appear volume overloaded on exam today -2D echo pending  4.  HTN -DBP elevated and increasing Lopressor -home amlodipine stopped and now on IV Cardizem gtt    I have spent a total of 35 minutes with patient reviewing, discussing anemia labs with primary telemetry, EKGs, labs and examining patient as well as establishing an assessment and plan that was discussed with the patient.  > 50% of time was spent in direct patient care.    For questions or updates, please contact [place] Please consult [email or web address] for contact info under        Signed, Fransico Him, MD  08/22/2020, 9:15 AM

## 2020-08-22 NOTE — Progress Notes (Signed)
Ortho rec's sling, pain control over night. They will see in the AM for dislocated Left shoulder.

## 2020-08-22 NOTE — Progress Notes (Signed)
  Echocardiogram 2D Echocardiogram has been performed.  Tony Roberts 08/22/2020, 5:22 PM

## 2020-08-22 NOTE — Progress Notes (Signed)
Pt was admitted from ED with A.fib RVR, HR 130-150s. Denies any chest pain currently. MD and charge nurse aware of HR and yellow mews. Meds are being adjusted to get HR under control. Will continue to monitor pt.

## 2020-08-23 ENCOUNTER — Encounter (HOSPITAL_COMMUNITY)
Admission: EM | Disposition: A | Discharge status: Home / Self Care | Payer: Self-pay | Source: Home / Self Care | Attending: Internal Medicine

## 2020-08-23 ENCOUNTER — Inpatient Hospital Stay (HOSPITAL_COMMUNITY): Payer: Medicare Other | Admitting: Certified Registered"

## 2020-08-23 ENCOUNTER — Encounter (HOSPITAL_COMMUNITY): Payer: Self-pay | Admitting: Internal Medicine

## 2020-08-23 ENCOUNTER — Inpatient Hospital Stay (HOSPITAL_COMMUNITY): Payer: Medicare Other

## 2020-08-23 DIAGNOSIS — S4292XA Fracture of left shoulder girdle, part unspecified, initial encounter for closed fracture: Secondary | ICD-10-CM | POA: Diagnosis present

## 2020-08-23 DIAGNOSIS — D508 Other iron deficiency anemias: Secondary | ICD-10-CM | POA: Diagnosis not present

## 2020-08-23 DIAGNOSIS — I4891 Unspecified atrial fibrillation: Secondary | ICD-10-CM | POA: Diagnosis not present

## 2020-08-23 DIAGNOSIS — I10 Essential (primary) hypertension: Secondary | ICD-10-CM | POA: Diagnosis not present

## 2020-08-23 DIAGNOSIS — I517 Cardiomegaly: Secondary | ICD-10-CM | POA: Diagnosis not present

## 2020-08-23 DIAGNOSIS — S4292XS Fracture of left shoulder girdle, part unspecified, sequela: Secondary | ICD-10-CM

## 2020-08-23 DIAGNOSIS — I429 Cardiomyopathy, unspecified: Secondary | ICD-10-CM

## 2020-08-23 HISTORY — PX: SHOULDER CLOSED REDUCTION: SHX1051

## 2020-08-23 LAB — CBC
HCT: 34.6 % — ABNORMAL LOW (ref 39.0–52.0)
Hemoglobin: 10.8 g/dL — ABNORMAL LOW (ref 13.0–17.0)
MCH: 24.5 pg — ABNORMAL LOW (ref 26.0–34.0)
MCHC: 31.2 g/dL (ref 30.0–36.0)
MCV: 78.5 fL — ABNORMAL LOW (ref 80.0–100.0)
Platelets: 158 10*3/uL (ref 150–400)
RBC: 4.41 MIL/uL (ref 4.22–5.81)
RDW: 16.1 % — ABNORMAL HIGH (ref 11.5–15.5)
WBC: 11.4 10*3/uL — ABNORMAL HIGH (ref 4.0–10.5)
nRBC: 0 % (ref 0.0–0.2)

## 2020-08-23 LAB — BASIC METABOLIC PANEL
Anion gap: 11 (ref 5–15)
BUN: 19 mg/dL (ref 8–23)
CO2: 21 mmol/L — ABNORMAL LOW (ref 22–32)
Calcium: 8.9 mg/dL (ref 8.9–10.3)
Chloride: 104 mmol/L (ref 98–111)
Creatinine, Ser: 1.13 mg/dL (ref 0.61–1.24)
GFR, Estimated: >60 mL/min (ref >60)
Glucose, Bld: 152 mg/dL — ABNORMAL HIGH (ref 70–99)
Potassium: 3.7 mmol/L (ref 3.5–5.1)
Sodium: 136 mmol/L (ref 135–145)

## 2020-08-23 LAB — HEPARIN LEVEL (UNFRACTIONATED): Heparin Unfractionated: 0.14 IU/mL — ABNORMAL LOW (ref 0.30–0.70)

## 2020-08-23 IMAGING — CR DG SHOULDER 1V*L*
2 series · 2 of 2 positions shown · non-contrast
Comparison: [DATE]

CLINICAL DATA: Closed reduction LEFT shoulder

EXAM:
LEFT SHOULDER

[AP (1 of 2)]
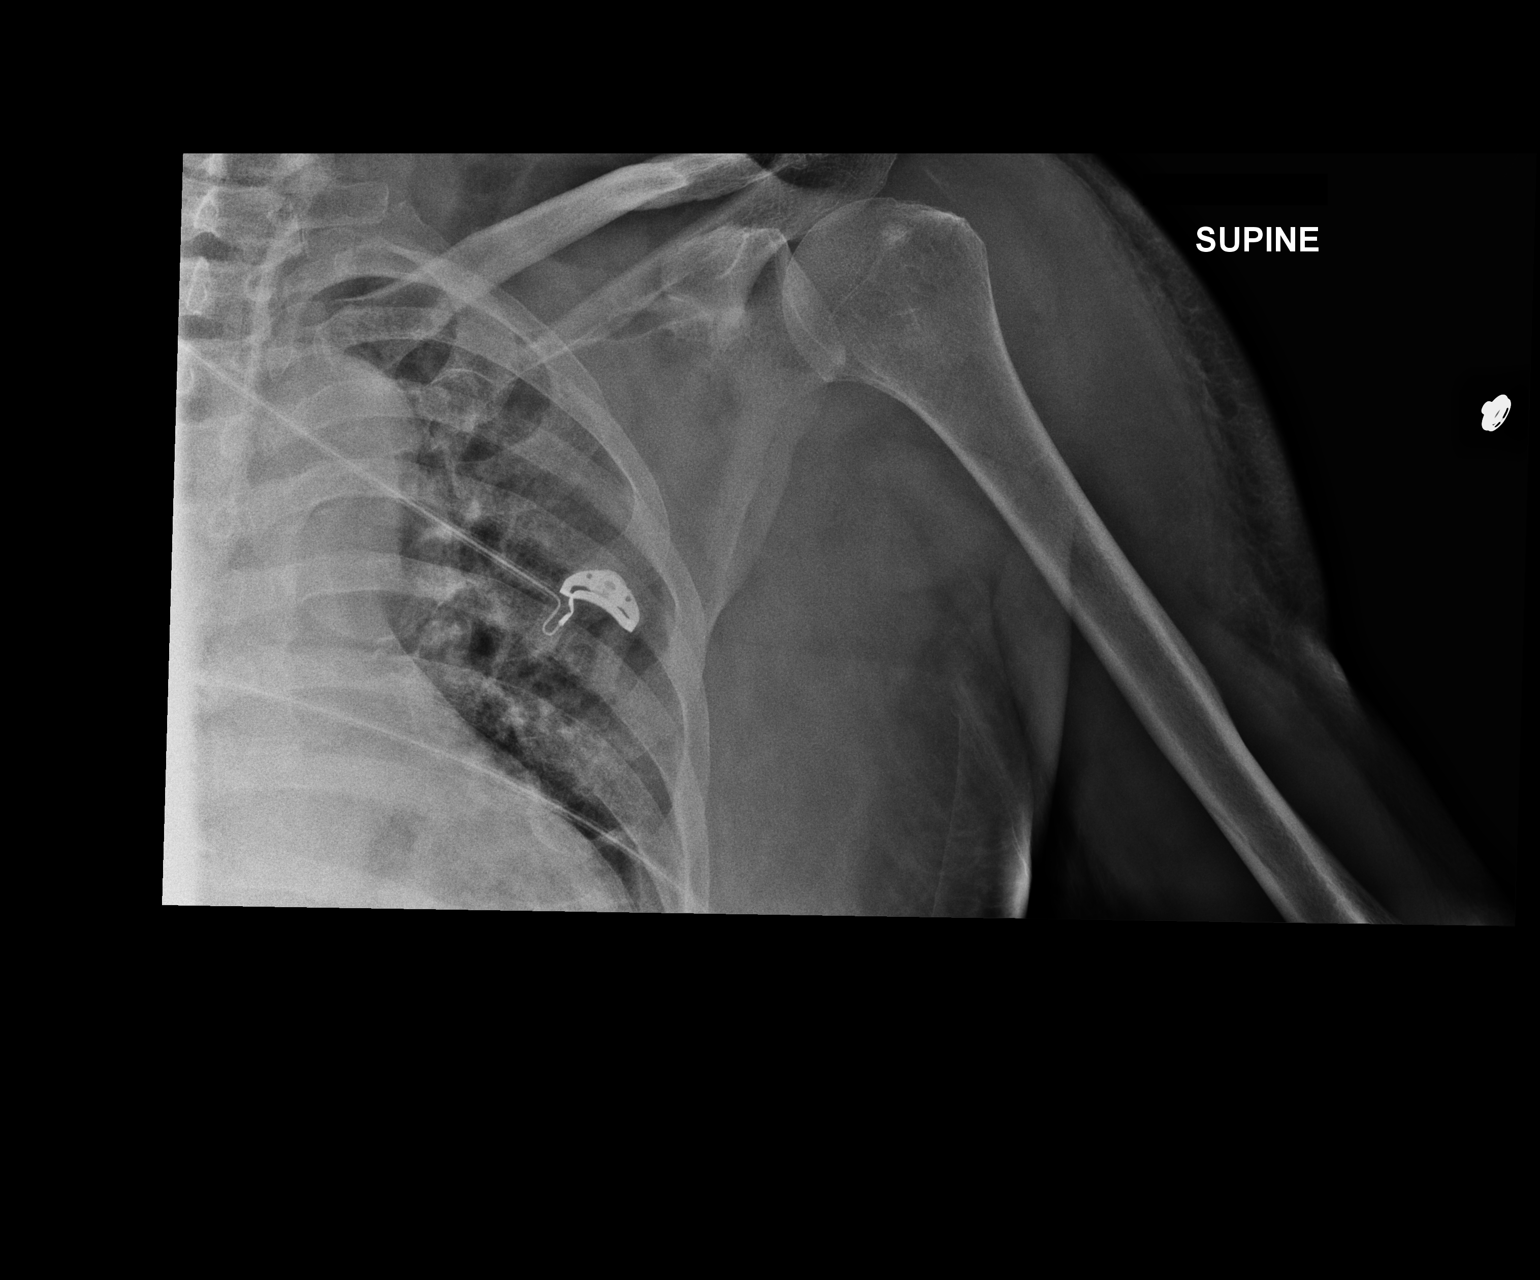

[AP (2 of 2)]
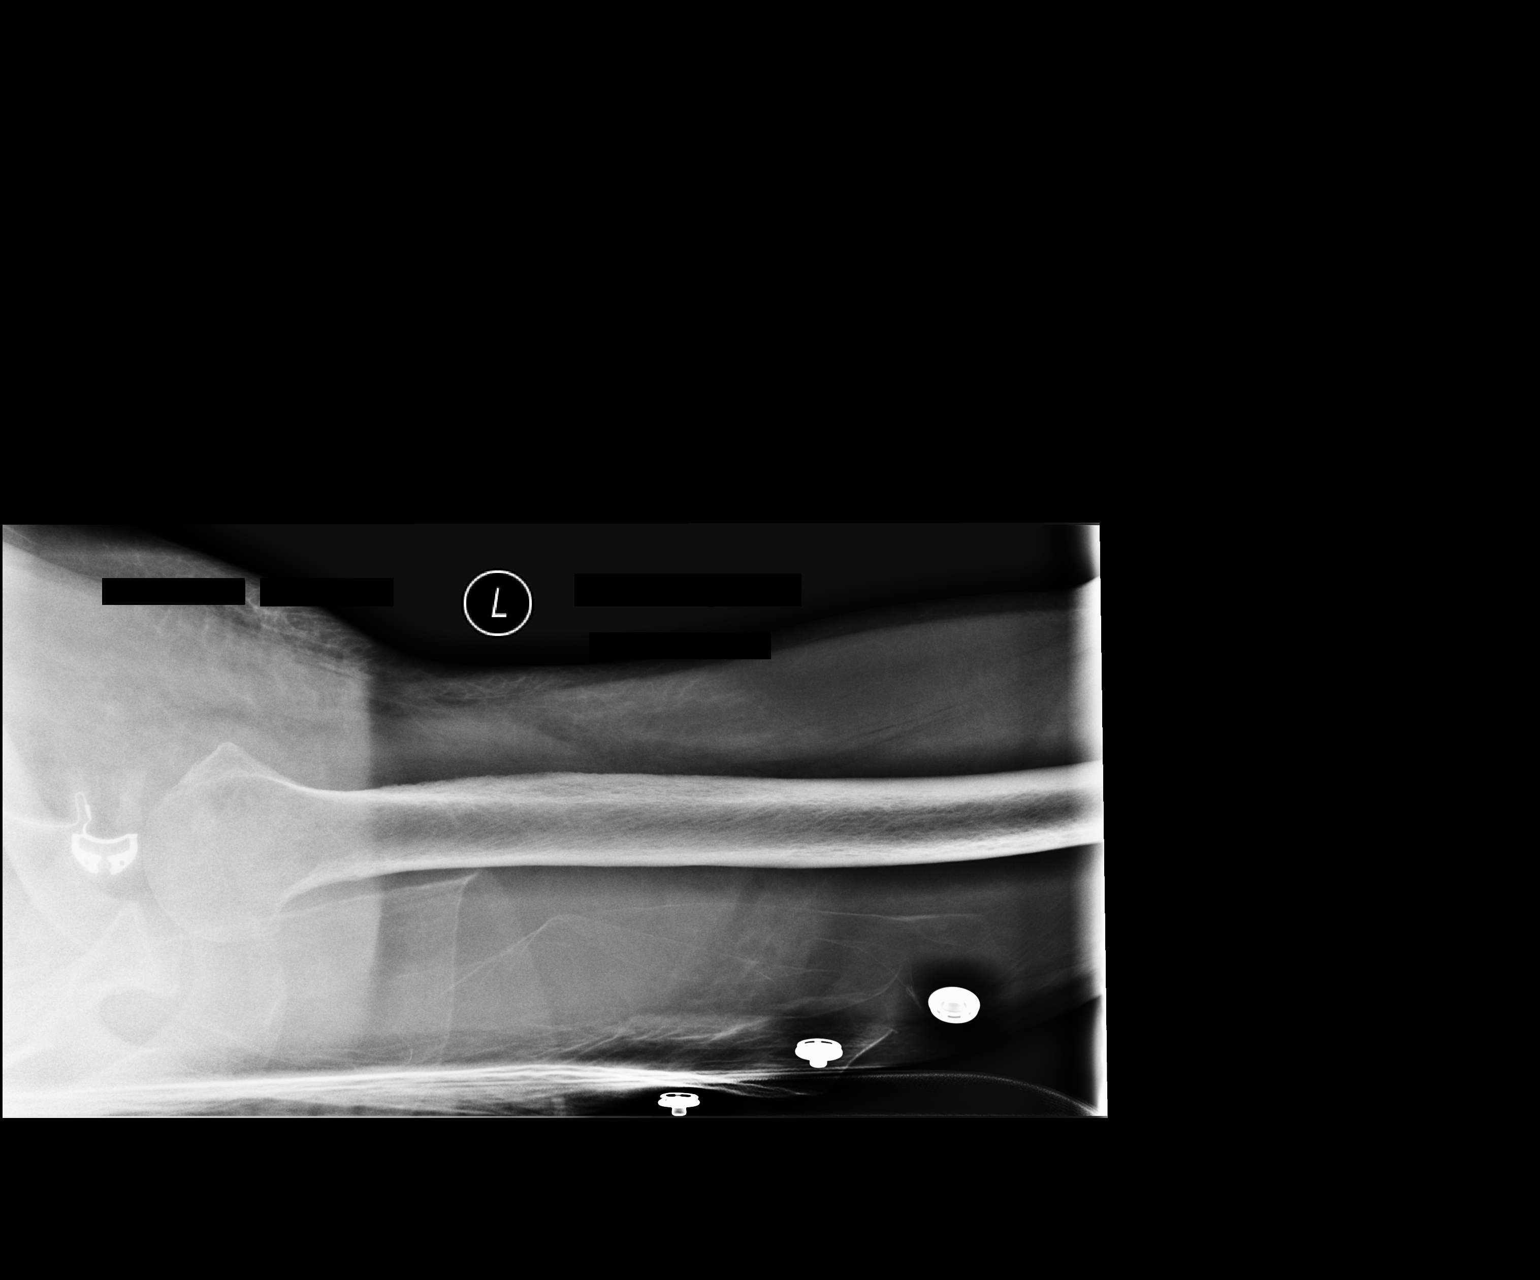

[2 of 2 positions shown; findings below may reference images not displayed]

FINDINGS: Initial AP image at [DT] hours demonstrates overlap of the LEFT
humeral head with the glenoid likely representing persistent
anterior dislocation.

Second image at [DT] hours, axillary view, demonstrates either
persistent anterior dislocation versus less likely marked anterior
subluxation.
IMPRESSION: Initial image demonstrates persistent anterior glenohumeral
dislocation with the axillary view demonstrating persistent anterior
dislocation versus less likely marked anterior subluxation.

Findings called to Dr. PADMAJA on [DATE] at [DT] hours.

## 2020-08-23 SURGERY — CLOSED REDUCTION, SHOULDER
Anesthesia: General | Site: Shoulder | Laterality: Left

## 2020-08-23 SURGERY — ESOPHAGOGASTRODUODENOSCOPY (EGD) WITH PROPOFOL
Anesthesia: Monitor Anesthesia Care

## 2020-08-23 MED ORDER — FUROSEMIDE 10 MG/ML IJ SOLN
40.0000 mg | Freq: Once | INTRAMUSCULAR | Status: DC
  Filled 2020-08-23: qty 4

## 2020-08-23 MED ORDER — PROPOFOL 10 MG/ML IV BOLUS
INTRAVENOUS | Status: AC
  Filled 2020-08-23: qty 20

## 2020-08-23 MED ORDER — ASPIRIN EC 81 MG PO TBEC
81.0000 mg | DELAYED_RELEASE_TABLET | Freq: Every day | ORAL | Status: DC
  Administered 2020-08-24: 81 mg via ORAL
  Filled 2020-08-23: qty 1

## 2020-08-23 MED ORDER — OXYCODONE HCL 5 MG PO TABS: 5.0000 mg | ORAL_TABLET | Freq: Once | ORAL | Status: DC | PRN

## 2020-08-23 MED ORDER — METOPROLOL TARTRATE 50 MG PO TABS
75.0000 mg | ORAL_TABLET | Freq: Two times a day (BID) | ORAL | Status: DC
  Administered 2020-08-23 – 2020-08-27 (×10): 75 mg via ORAL
  Filled 2020-08-23 (×10): qty 1

## 2020-08-23 MED ORDER — OXYCODONE HCL 5 MG/5ML PO SOLN: 5.0000 mg | Freq: Once | ORAL | Status: DC | PRN

## 2020-08-23 MED ORDER — CHLORHEXIDINE GLUCONATE 0.12 % MT SOLN
15.0000 mL | Freq: Once | OROMUCOSAL | Status: AC
  Administered 2020-08-24: 15 mL via OROMUCOSAL
  Filled 2020-08-23 (×2): qty 15

## 2020-08-23 MED ORDER — MORPHINE SULFATE (PF) 2 MG/ML IV SOLN
2.0000 mg | INTRAVENOUS | Status: DC | PRN
  Administered 2020-08-23: 2 mg via INTRAVENOUS
  Filled 2020-08-23: qty 1

## 2020-08-23 MED ORDER — ACETAMINOPHEN 10 MG/ML IV SOLN: 1000.0000 mg | Freq: Once | INTRAVENOUS | Status: DC | PRN

## 2020-08-23 MED ORDER — FUROSEMIDE 10 MG/ML IJ SOLN
40.0000 mg | Freq: Every day | INTRAMUSCULAR | Status: DC
  Administered 2020-08-24 – 2020-08-25 (×2): 40 mg via INTRAVENOUS
  Filled 2020-08-23: qty 4

## 2020-08-23 MED ORDER — LACTATED RINGERS IV SOLN: INTRAVENOUS | Status: DC

## 2020-08-23 MED ORDER — FENTANYL CITRATE (PF) 250 MCG/5ML IJ SOLN
INTRAMUSCULAR | Status: AC
  Filled 2020-08-23: qty 5

## 2020-08-23 MED ORDER — SODIUM CHLORIDE 0.9 % IV SOLN: INTRAVENOUS | Status: DC

## 2020-08-23 MED ORDER — HEPARIN (PORCINE) 25000 UT/250ML-% IV SOLN
1700.0000 [IU]/h | INTRAVENOUS | Status: AC
  Administered 2020-08-23: 1600 [IU]/h via INTRAVENOUS
  Filled 2020-08-23 (×2): qty 250

## 2020-08-23 MED ORDER — AMIODARONE LOAD VIA INFUSION
150.0000 mg | Freq: Once | INTRAVENOUS | Status: AC
  Administered 2020-08-23: 150 mg via INTRAVENOUS
  Filled 2020-08-23: qty 83.34

## 2020-08-23 MED ORDER — AMIODARONE HCL IN DEXTROSE 360-4.14 MG/200ML-% IV SOLN: 60.0000 mg/h | INTRAVENOUS | Status: AC

## 2020-08-23 MED ORDER — PROPOFOL 10 MG/ML IV BOLUS
INTRAVENOUS | Status: DC | PRN
  Administered 2020-08-23: 60 mg via INTRAVENOUS

## 2020-08-23 MED ORDER — AMIODARONE HCL IN DEXTROSE 360-4.14 MG/200ML-% IV SOLN
30.0000 mg/h | INTRAVENOUS | Status: DC
  Administered 2020-08-23 – 2020-08-28 (×10): 30 mg/h via INTRAVENOUS
  Filled 2020-08-23 (×11): qty 200

## 2020-08-23 MED ORDER — PHENYLEPHRINE HCL (PRESSORS) 10 MG/ML IV SOLN
INTRAVENOUS | Status: DC | PRN
  Administered 2020-08-23: 120 ug via INTRAVENOUS

## 2020-08-23 MED ORDER — ACETAMINOPHEN 160 MG/5ML PO SOLN: 1000.0000 mg | Freq: Once | ORAL | Status: DC | PRN

## 2020-08-23 MED ORDER — FENTANYL CITRATE (PF) 100 MCG/2ML IJ SOLN: 25.0000 ug | INTRAMUSCULAR | Status: DC | PRN

## 2020-08-23 MED ORDER — ACETAMINOPHEN 500 MG PO TABS: 1000.0000 mg | ORAL_TABLET | Freq: Once | ORAL | Status: DC | PRN

## 2020-08-23 MED ORDER — LIDOCAINE 2% (20 MG/ML) 5 ML SYRINGE
INTRAMUSCULAR | Status: DC | PRN
  Administered 2020-08-23: 20 mg via INTRAVENOUS
  Administered 2020-08-23: 60 mg via INTRAVENOUS

## 2020-08-23 MED ORDER — FENTANYL CITRATE (PF) 100 MCG/2ML IJ SOLN
INTRAMUSCULAR | Status: DC | PRN
  Administered 2020-08-23: 50 ug via INTRAVENOUS

## 2020-08-23 NOTE — Progress Notes (Signed)
  Amiodarone Drug - Drug Interaction Consult Note  Recommendations: Vitals stable - no medication changes indicated at this time. Continue to monitor for future adjustments.   Amiodarone is metabolized by the cytochrome P450 system and therefore has the potential to cause many drug interactions. Amiodarone has an average plasma half-life of 50 days (range 20 to 100 days).   There is potential for drug interactions to occur several weeks or months after stopping treatment and the onset of drug interactions may be slow after initiating amiodarone.   []  Statins: Increased risk of myopathy. Simvastatin- restrict dose to 20mg  daily. Other statins: counsel patients to report any muscle pain or weakness immediately.  []  Anticoagulants: Amiodarone can increase anticoagulant effect. Consider warfarin dose reduction. Patients should be monitored closely and the dose of anticoagulant altered accordingly, remembering that amiodarone levels take several weeks to stabilize.  []  Antiepileptics: Amiodarone can increase plasma concentration of phenytoin, the dose should be reduced. Note that small changes in phenytoin dose can result in large changes in levels. Monitor patient and counsel on signs of toxicity.  [x]  Beta blockers: increased risk of bradycardia, AV block and myocardial depression. Sotalol - avoid concomitant use.  []   Calcium channel blockers (diltiazem and verapamil): increased risk of bradycardia, AV block and myocardial depression.  []   Cyclosporine: Amiodarone increases levels of cyclosporine. Reduced dose of cyclosporine is recommended.  []  Digoxin dose should be halved when amiodarone is started.  [x]  Diuretics: increased risk of cardiotoxicity if hypokalemia occurs.  []  Oral hypoglycemic agents (glyburide, glipizide, glimepiride): increased risk of hypoglycemia. Patient's glucose levels should be monitored closely when initiating amiodarone therapy.   []  Drugs that prolong the QT  interval:  Torsades de pointes risk may be increased with concurrent use - avoid if possible.  Monitor QTc, also keep magnesium/potassium WNL if concurrent therapy can't be avoided. Marland Kitchen Antibiotics: e.g. fluoroquinolones, erythromycin. . Antiarrhythmics: e.g. quinidine, procainamide, disopyramide, sotalol. . Antipsychotics: e.g. phenothiazines, haloperidol.  . Lithium, tricyclic antidepressants, and methadone.  Thank You,   Antonietta Jewel, PharmD, [place] Pharmacist  Phone: [phone] 08/23/2020 1:55 PM  Please check AMION for all [place] phone numbers After 10:00 PM, call [place] [phone]

## 2020-08-23 NOTE — Anesthesia Preprocedure Evaluation (Addendum)
Anesthesia Evaluation  Patient identified by MRN, date of birth, ID band Patient awake    Reviewed: Allergy & Precautions, NPO status , Patient's Chart, lab work & pertinent test results  History of Anesthesia Complications Negative for: history of anesthetic complications  Airway Mallampati: III  TM Distance: >3 FB Neck ROM: Full    Dental  (+) Edentulous Upper, Edentulous Lower   Pulmonary shortness of breath, asthma , neg sleep apnea, neg COPD, neg recent URI,    breath sounds clear to auscultation       Cardiovascular hypertension, Pt. on medications + dysrhythmias Atrial Fibrillation  Rhythm:Irregular  1. Left ventricular ejection fraction, by estimation, is 25 to 30%. The  left ventricle has severely decreased function. The left ventricle  demonstrates global hypokinesis. There is moderate left ventricular  hypertrophy of the basal-septal segment. Left  ventricular diastolic function could not be evaluated.  2. Right ventricular systolic function is normal. The right ventricular  size is normal. Tricuspid regurgitation signal is inadequate for assessing  PA pressure.  3. Left atrial size was severely dilated.  4. Right atrial size was mildly dilated.  5. The mitral valve is normal in structure. Mild mitral valve  regurgitation. No evidence of mitral stenosis.  6. The aortic valve is tricuspid. Aortic valve regurgitation is mild.  Mild aortic valve sclerosis is present, with no evidence of aortic valve  stenosis.  7. Aortic dilatation noted. There is mild dilatation of the ascending  aorta, measuring 40 mm.  8. The inferior vena cava is normal in size with greater than 50%  respiratory variability, suggesting right atrial pressure of 3 mmHg.    Neuro/Psych  Headaches,  Neuromuscular disease negative psych ROS   GI/Hepatic Neg liver ROS, GERD  Medicated and Controlled,  Endo/Other  Lab Results      Component                 Value               Date                      HGBA1C                   5.8 (H)             08/21/2020             Renal/GU negative Renal ROSLab Results      Component                Value               Date                      CREATININE               1.13                08/23/2020                Musculoskeletal  (+) Arthritis ,   Abdominal   Peds  Hematology  (+) Blood dyscrasia, anemia , Lab Results      Component                Value               Date  WBC                      11.4 (H)            08/23/2020                HGB                      10.8 (L)            08/23/2020                HCT                      34.6 (L)            08/23/2020                MCV                      78.5 (L)            08/23/2020                PLT                      158                 08/23/2020              Anesthesia Other Findings   Reproductive/Obstetrics                             Anesthesia Physical Anesthesia Plan  ASA: III  Anesthesia Plan: General   Post-op Pain Management:    Induction: Intravenous  PONV Risk Score and Plan: 2 and Treatment may vary due to age or medical condition  Airway Management Planned: Mask  Additional Equipment: None  Intra-op Plan:   Post-operative Plan:   Informed Consent: I have reviewed the patients History and Physical, chart, labs and discussed the procedure including the risks, benefits and alternatives for the proposed anesthesia with the patient or authorized representative who has indicated his/her understanding and acceptance.     Dental advisory given  Plan Discussed with: CRNA and Surgeon  Anesthesia Plan Comments:         Anesthesia Quick Evaluation

## 2020-08-23 NOTE — Progress Notes (Signed)
[facility] for IV Heparin Indication: atrial fibrillation and recent TIAs  No Known Allergies  Patient Measurements: Weight 89.8 kg (from 08/19/20) Hight 5'5 (from 08/19/20) Heparin Dosing Weight: 81 kg  Vital Signs: Temp: 98.3 F (36.8 C) (12/03 1301) Temp Source: Oral (12/03 1301) BP: 112/59 (12/03 1452) Pulse Rate: 76 (12/03 1452)  Labs: Recent Labs    08/21/20 1105 08/21/20 1105 08/21/20 1343 08/22/20 0104 08/22/20 1135 08/23/20 0228  HGB 11.6*   < >  --  11.5*  --  10.8*  HCT 38.1*  --   --  36.3*  --  34.6*  PLT 152  --   --  168  --  158  HEPARINUNFRC  --   --   --  <0.10* 0.12* 0.14*  CREATININE 1.10  --   --  1.09  --  1.13  TROPONINIHS 10  --  10  --   --   --    < > = values in this interval not displayed.    Estimated Creatinine Clearance: 53.9 mL/min (by C-G formula based on SCr of 1.13 mg/dL).   Medical History: Past Medical History:  Diagnosis Date  . Arthritis   . Asthma    last asthma attack at age 25  . GERD (gastroesophageal reflux disease)   . Headache   . Other specified iron deficiency anemias   . PAC (premature atrial contraction)    occasional PAC's    Medications:  Medications Prior to Admission  Medication Sig Dispense Refill Last Dose  . ADVAIR DISKUS 250-50 MCG/DOSE AEPB INHALE 1 PUFF BY MOUTH ONCE TO TWICE A DAY TO PREVENT COUGH OR WHEEZE. RINSE, GARGLE, AND SPIT AFTER USE. (Patient taking differently: Inhale 1 puff into the lungs See admin instructions. Inhale 1 puff into the lungs at bedtime and an additional 1 puff in the morning as needed for coughing, wheezing, or shortness of breath) 180 each 0 08/20/2020 at Unknown time  . albuterol (PROVENTIL HFA) 108 (90 Base) MCG/ACT inhaler Inhale two puffs every four to six hours as needed for cough or wheeze. (Patient taking differently: Inhale 2 puffs into the lungs See admin instructions. Inhale 2 puffs into the lungs every four to six hours as  needed for coughing or wheezing) 1 Inhaler 1 unk  . amLODipine (NORVASC) 5 MG tablet Take 5 mg by mouth at bedtime.   11 08/20/2020 at pm  . esomeprazole (NEXIUM) 40 MG capsule Take 40 mg by mouth See admin instructions. Take 40 mg by mouth in the morning before breakfast and an additional 40 mg once a day as needed for recurring heartburn   08/21/2020 at am  . famotidine (PEPCID) 40 MG tablet Take 40 mg by mouth at bedtime.    08/19/2020 at pm  . montelukast (SINGULAIR) 10 MG tablet TAKE 1 TABLET (10 MG TOTAL) BY MOUTH AT BEDTIME. 90 tablet 0 08/20/2020 at pm  . triamcinolone (NASACORT ALLERGY 24HR) 55 MCG/ACT AERO nasal inhaler Place 1 spray into the nose daily as needed (for allergies or rhinitis).    unk  . omeprazole (PRILOSEC) 40 MG capsule Take 1 capsule (40 mg total) by mouth in the morning and at bedtime. (Patient not taking: Reported on 08/21/2020) 60 capsule 5 Not Taking at Unknown time    Assessment: 79 years of age male with history of several TIAs in the last month and admitted with atrial fibrillation to start IV Heparin per pharmacy dosing. Hgb/Hct are stable from  previous labs. Platelets are stable at 152. No bleeding reported. Patient was not on an anticoagulant prior to admission.   Patient unable to go for endoscopy given fall and inability to lay flat- heparin was stopped at 0300. Previous to stopping heparin level came back subtherapeutic at 0.14, on 1400 units/hr. Hgb 10.8, plt 158. No s/sx of bleeding.   Orders received from Dr. Kurtis Bushman who clarified with surgery to resume IV Heparin.   Goal of Therapy:  Heparin level 0.3 to 0.5 units/ml  - lower goal due to multiple recent TIAs Monitor platelets by anticoagulation protocol: Yes   Plan:  Restart IV Heparin at 1600 units/hr.  No bolus.  Heparin level in 8 hours.  Daily heparin level and CBC while on therapy.   Sloan Leiter, PharmD, BCPS, BCCCP Clinical Pharmacist Please refer to [facility] for [place]  numbers 08/23/2020 6:55 PM  Please check AMION for all [facility] phone numbers After 10:00 PM, call [facility] [phone]

## 2020-08-23 NOTE — Progress Notes (Signed)
Orthopedic Tech Progress Note Patient Details:  Tony Roberts 01/02/1941 [id number]  Ortho Devices Type of Ortho Device: Arm sling Ortho Device/Splint Location: LUE Ortho Device/Splint Interventions: Application, Adjustment   Post Interventions Patient Tolerated: Well Instructions Provided: Adjustment of device   Adreona Brand E Edi Gorniak 08/23/2020, 1:18 AM

## 2020-08-23 NOTE — Op Note (Addendum)
   Date of Surgery: 08/23/2020  INDICATIONS: Mr. Eakle is a 79 y.o.-year-old male with a left shoulder dislocation resulting from a mechanical fall last night.  The patient did consent to the procedure after discussion of the risks and benefits.  PREOPERATIVE DIAGNOSIS: Left shoulder dislocation  POSTOPERATIVE DIAGNOSIS: Same.  PROCEDURE: Closed reduction of left shoulder dislocation requiring anesthesia  SURGEON: N. Eduard Roux, M.D.  ASSIST: Madalyn Rob, [place]  ANESTHESIA: MAC  IV FLUIDS AND URINE: See anesthesia.  ESTIMATED BLOOD LOSS: None  IMPLANTS: None  DRAINS: None  COMPLICATIONS: see description of procedure.  DESCRIPTION OF PROCEDURE: The patient was brought to the operating room.  The patient had been signed prior to the procedure and this was documented. The patient had the anesthesia placed by the anesthesiologist.  A time-out was performed to confirm that this was the correct patient, site, side and location.  By using gentle traction and countertraction placed in his axilla I was able to manually reduce the shoulder with a palpable and visible clunk.  Orthogonal x-rays showed overall reduction of the fracture with some slight anterior translation of the humeral head in relation to the glenoid.  Multiple attempts were made to improve this but I was unable to.  I suspect that he likely has a large rotator cuff tear as a result of the dislocation.  Given the fact that there is no neurovascular compromise to his extremity distally we decided to cease additional attempts at a concentric reduction.  His shoulder was placed in an immobilizer.  He was awakened from anesthesia and transferred to the PACU in stable condition.  POSTOPERATIVE PLAN: He is to wear the sling at all times.  Nonweightbearing.  Will need MRI left shoulder evaluate reasons for persistent anterior subluxation.  Azucena Cecil, MD 11:34 AM

## 2020-08-23 NOTE — Anesthesia Postprocedure Evaluation (Signed)
Anesthesia Post Note  Patient: Tony Roberts  Procedure(s) Performed: CLOSED REDUCTION SHOULDER (Left Shoulder)     Patient location during evaluation: PACU Anesthesia Type: General Level of consciousness: awake and alert Pain management: pain level controlled Vital Signs Assessment: post-procedure vital signs reviewed and stable Respiratory status: spontaneous breathing, nonlabored ventilation, respiratory function stable and patient connected to nasal cannula oxygen Cardiovascular status: blood pressure returned to baseline and stable Postop Assessment: no apparent nausea or vomiting Anesthetic complications: no   No complications documented.  Last Vitals:  Vitals:   08/23/20 1301 08/23/20 1452  BP: 115/84 (!) 112/59  Pulse: (!) 107 76  Resp: 20 16  Temp: 36.8 C   SpO2:  98%    Last Pain:  Vitals:   08/23/20 1301  TempSrc: Oral  PainSc:                  Renelda Kilian

## 2020-08-23 NOTE — Transfer of Care (Signed)
Immediate Anesthesia Transfer of Care Note  Patient: Tony Roberts  Procedure(s) Performed: CLOSED REDUCTION SHOULDER (Left Shoulder)  Patient Location: PACU  Anesthesia Type:General  Level of Consciousness: awake, alert  and oriented  Airway & Oxygen Therapy: Patient Spontanous Breathing and Patient connected to face mask oxygen  Post-op Assessment: Report given to RN and Post -op Vital signs reviewed and stable  Post vital signs: Reviewed and stable  Last Vitals:  Vitals Value Taken Time  BP 123/93 08/23/20 1226  Temp 36.7 C 08/23/20 1138  Pulse 120 08/23/20 1231  Resp 20 08/23/20 1231  SpO2 100 % 08/23/20 1231  Vitals shown include unvalidated device data.  Last Pain:  Vitals:   08/23/20 1145  TempSrc:   PainSc: 0-No pain         Complications: No complications documented.

## 2020-08-23 NOTE — Progress Notes (Addendum)
Progress Note  Patient Name: Tony Roberts Date of Encounter: 08/23/2020  [facility] Cardiologist: NEW  Subjective   Remains in afib with RVR despite IV Cardizem and Lopressor.  Denies any chest pain or SOB.  Was on scheduled for endoscopy this am to determine source of low iron panel but this is now on hold as he fell out of bed and dislocated his shoulder  Inpatient Medications    Scheduled Meds: . aspirin EC  81 mg Oral Daily  . famotidine  40 mg Oral QHS  . fluticasone furoate-vilanterol  1 puff Inhalation Daily  . metoprolol tartrate  50 mg Oral BID  . montelukast  10 mg Oral QHS  . pantoprazole  40 mg Oral Daily  . sodium chloride flush  3 mL Intravenous Q12H   Continuous Infusions: . sodium chloride    . diltiazem (CARDIZEM) infusion 15 mg/hr (08/22/20 1342)   PRN Meds: sodium chloride, acetaminophen, levalbuterol, morphine injection, ondansetron (ZOFRAN) IV, sodium chloride flush, traMADol, triamcinolone   Vital Signs    Vitals:   08/23/20 0000 08/23/20 0108 08/23/20 0200 08/23/20 0416  BP: 119/85 137/90 (!) 133/106 (!) 140/98  Pulse: (!) 122 (!) 116 (!) 119 (!) 118  Resp: (!) 23 (!) 23 19 18   Temp: 97.9 F (36.6 C) 97.8 F (36.6 C) 97.8 F (36.6 C) 98.1 F (36.7 C)  TempSrc: Oral Oral  Oral  SpO2: 92% 93% 91% 93%  Weight:    87.6 kg  Height:        Intake/Output Summary (Last 24 hours) at 08/23/2020 0735 Last data filed at 08/23/2020 0421 Gross per 24 hour  Intake 493.29 ml  Output 301 ml  Net 192.29 ml   Last 3 Weights 08/23/2020 08/22/2020 08/22/2020  Weight (lbs) 193 lb 2 oz 187 lb 2.7 oz 187 lb 2.7 oz  Weight (kg) 87.6 kg 84.9 kg 84.9 kg      Telemetry    Atrial fibrillation in the 120's - Personally Reviewed  ECG    No new EKG to review - Personally Reviewed  Physical Exam   GEN: Well nourished, well developed in no acute distress HEENT: Normal NECK: No JVD; No carotid bruits LYMPHATICS: No lymphadenopathy CARDIAC:irregularly  irregular, no murmurs, rubs, gallops RESPIRATORY:  Clear to auscultation without rales, wheezing or rhonchi  ABDOMEN: Soft, non-tender, non-distended MUSCULOSKELETAL:  No edema; No deformity  SKIN: Warm and dry NEUROLOGIC:  Alert and oriented x 3 PSYCHIATRIC:  Normal affect    Labs    High Sensitivity Troponin:   Recent Labs  Lab 08/21/20 1105 08/21/20 1343  TROPONINIHS 10 10      Chemistry Recent Labs  Lab 08/21/20 1105 08/22/20 0104 08/23/20 0228  NA 139 141 136  K 3.4* 3.8 3.7  CL 105 105 104  CO2 21* 24 21*  GLUCOSE 135* 121* 152*  BUN 10 9 19   CREATININE 1.10 1.09 1.13  CALCIUM 8.8* 9.0 8.9  PROT 6.3*  --   --   ALBUMIN 3.5  --   --   AST 26  --   --   ALT 20  --   --   ALKPHOS 64  --   --   BILITOT 1.4*  --   --   GFRNONAA >60 >60 >60  ANIONGAP 13 12 11      Hematology Recent Labs  Lab 08/21/20 1105 08/22/20 0104 08/23/20 0228  WBC 7.2 6.6 11.4*  RBC 4.74 4.65 4.41  HGB 11.6* 11.5* 10.8*  HCT 38.1* 36.3* 34.6*  MCV 80.4 78.1* 78.5*  MCH 24.5* 24.7* 24.5*  MCHC 30.4 31.7 31.2  RDW 16.2* 16.3* 16.1*  PLT 152 168 158    BNP Recent Labs  Lab 08/21/20 1127  BNP 328.4*     DDimer No results for input(s): DDIMER in the last 168 hours.    CHA2DS2-VASc Score = 5  This indicates a 7.2% annual risk of stroke. The patient's score is based upon:   Radiology    CT HEAD WO CONTRAST  Result Date: 08/23/2020 CLINICAL DATA:  Head trauma EXAM: CT HEAD WITHOUT CONTRAST CT CERVICAL SPINE WITHOUT CONTRAST TECHNIQUE: Multidetector CT imaging of the head and cervical spine was performed following the standard protocol without intravenous contrast. Multiplanar CT image reconstructions of the cervical spine were also generated. COMPARISON:  None. FINDINGS: CT HEAD FINDINGS Brain: There is no mass, hemorrhage or extra-axial collection. The size and configuration of the ventricles and extra-axial CSF spaces are normal. The brain parenchyma is normal, without  evidence of acute or chronic infarction. Vascular: Atherosclerotic calcification of the vertebral and internal carotid arteries at the skull base. No abnormal hyperdensity of the major intracranial arteries or dural venous sinuses. Skull: The visualized skull base, calvarium and extracranial soft tissues are normal. Sinuses/Orbits: No fluid levels or advanced mucosal thickening of the visualized paranasal sinuses. No mastoid or middle ear effusion. The orbits are normal. CT CERVICAL SPINE FINDINGS Alignment: No static subluxation. Facets are aligned. Occipital condyles are normally positioned. Skull base and vertebrae: No acute fracture. Soft tissues and spinal canal: No prevertebral fluid or swelling. No visible canal hematoma. Disc levels: No advanced spinal canal or neural foraminal stenosis. Upper chest: No pneumothorax, pulmonary nodule or pleural effusion. Other: Incidentally noted Klippel-Feil configuration. IMPRESSION: 1. No acute intracranial abnormality. 2. No acute fracture or static subluxation of the cervical spine. Electronically Signed   By: Ulyses Jarred M.D.   On: 08/23/2020 00:39   CT CERVICAL SPINE WO CONTRAST  Result Date: 08/23/2020 CLINICAL DATA:  Head trauma EXAM: CT HEAD WITHOUT CONTRAST CT CERVICAL SPINE WITHOUT CONTRAST TECHNIQUE: Multidetector CT imaging of the head and cervical spine was performed following the standard protocol without intravenous contrast. Multiplanar CT image reconstructions of the cervical spine were also generated. COMPARISON:  None. FINDINGS: CT HEAD FINDINGS Brain: There is no mass, hemorrhage or extra-axial collection. The size and configuration of the ventricles and extra-axial CSF spaces are normal. The brain parenchyma is normal, without evidence of acute or chronic infarction. Vascular: Atherosclerotic calcification of the vertebral and internal carotid arteries at the skull base. No abnormal hyperdensity of the major intracranial arteries or dural venous  sinuses. Skull: The visualized skull base, calvarium and extracranial soft tissues are normal. Sinuses/Orbits: No fluid levels or advanced mucosal thickening of the visualized paranasal sinuses. No mastoid or middle ear effusion. The orbits are normal. CT CERVICAL SPINE FINDINGS Alignment: No static subluxation. Facets are aligned. Occipital condyles are normally positioned. Skull base and vertebrae: No acute fracture. Soft tissues and spinal canal: No prevertebral fluid or swelling. No visible canal hematoma. Disc levels: No advanced spinal canal or neural foraminal stenosis. Upper chest: No pneumothorax, pulmonary nodule or pleural effusion. Other: Incidentally noted Klippel-Feil configuration. IMPRESSION: 1. No acute intracranial abnormality. 2. No acute fracture or static subluxation of the cervical spine. Electronically Signed   By: Ulyses Jarred M.D.   On: 08/23/2020 00:39   DG Chest Port 1 View  Result Date: 08/21/2020 CLINICAL DATA:  Shortness of  breath and palpitation EXAM: PORTABLE CHEST 1 VIEW COMPARISON:  10/23/2016 FINDINGS: Cardiomegaly and aortic tortuosity. Cardiomegaly appears new/progressed. Suspect small right pleural effusion. Mild interstitial prominence that is similar to prior. No pneumothorax. IMPRESSION: 1. Cardiopericardial enlargement. 2. Small right pleural effusion. Electronically Signed   By: Monte Fantasia M.D.   On: 08/21/2020 11:37   DG Shoulder Left Port  Result Date: 08/22/2020 CLINICAL DATA:  Recent fall with left shoulder pain and deformity, initial encounter EXAM: LEFT SHOULDER COMPARISON:  None. FINDINGS: Anterior inferior dislocation of the humeral head with respect to the glenoid is noted. No acute fracture is seen. The underlying bony thorax and remainder of the shoulder girdle appear within normal limits. IMPRESSION: Anterior inferior dislocation of the left humeral head as described. Electronically Signed   By: Inez Catalina M.D.   On: 08/22/2020 22:36    ECHOCARDIOGRAM COMPLETE  Result Date: 08/22/2020    ECHOCARDIOGRAM REPORT   Patient Name:   Tony Roberts Date of Exam: 08/22/2020 Medical Rec #:  [id number]      Height:       65.0 in Accession #:    [id number]     Weight:       187.2 lb Date of Birth:  [birth year]      BSA:          1.923 m Patient Age:    79 years       BP:           126/96 mmHg Patient Gender: M              HR:           96 bpm. Exam Location:  Inpatient Procedure: 2D Echo, Cardiac Doppler and Color Doppler Indications:    Atrial fibrillation  History:        Patient has no prior history of Echocardiogram examinations.  Sonographer:    Clayton Lefort RDCS (AE) Referring Phys: [id number] [place]  1. Left ventricular ejection fraction, by estimation, is 25 to 30%. The left ventricle has severely decreased function. The left ventricle demonstrates global hypokinesis. There is moderate left ventricular hypertrophy of the basal-septal segment. Left ventricular diastolic function could not be evaluated.  2. Right ventricular systolic function is normal. The right ventricular size is normal. Tricuspid regurgitation signal is inadequate for assessing PA pressure.  3. Left atrial size was severely dilated.  4. Right atrial size was mildly dilated.  5. The mitral valve is normal in structure. Mild mitral valve regurgitation. No evidence of mitral stenosis.  6. The aortic valve is tricuspid. Aortic valve regurgitation is mild. Mild aortic valve sclerosis is present, with no evidence of aortic valve stenosis.  7. Aortic dilatation noted. There is mild dilatation of the ascending aorta, measuring 40 mm.  8. The inferior vena cava is normal in size with greater than 50% respiratory variability, suggesting right atrial pressure of 3 mmHg. FINDINGS  Left Ventricle: Left ventricular ejection fraction, by estimation, is 25 to 30%. The left ventricle has severely decreased function. The left ventricle demonstrates global hypokinesis. The left  ventricular internal cavity size was normal in size. There is moderate left ventricular hypertrophy of the basal-septal segment. Left ventricular diastolic function could not be evaluated due to atrial fibrillation. Left ventricular diastolic function could not be evaluated. Right Ventricle: The right ventricular size is normal. Right ventricular systolic function is normal. Tricuspid regurgitation signal is inadequate for assessing PA pressure. The tricuspid regurgitant velocity is 2.07 m/s,  and with an assumed right atrial  pressure of 15 mmHg, the estimated right ventricular systolic pressure is 69.4 mmHg. Left Atrium: Left atrial size was severely dilated. Right Atrium: Right atrial size was mildly dilated. Pericardium: There is no evidence of pericardial effusion. Mitral Valve: The mitral valve is normal in structure. Mild mitral valve regurgitation. No evidence of mitral valve stenosis. Tricuspid Valve: The tricuspid valve is normal in structure. Tricuspid valve regurgitation is mild . No evidence of tricuspid stenosis. Aortic Valve: The aortic valve is tricuspid. Aortic valve regurgitation is mild. Aortic regurgitation PHT measures 953 msec. Mild aortic valve sclerosis is present, with no evidence of aortic valve stenosis. Pulmonic Valve: The pulmonic valve was not well visualized. Pulmonic valve regurgitation is not visualized. No evidence of pulmonic stenosis. Aorta: Aortic dilatation noted. There is mild dilatation of the ascending aorta, measuring 40 mm. Venous: The inferior vena cava is normal in size with greater than 50% respiratory variability, suggesting right atrial pressure of 3 mmHg. IAS/Shunts: No atrial level shunt detected by color flow Doppler.  LEFT VENTRICLE PLAX 2D LVIDd:         4.40 cm LVIDs:         3.60 cm LV PW:         1.80 cm LV IVS:        1.70 cm LVOT diam:     2.10 cm LV SV:         55 LV SV Index:   28 LVOT Area:     3.46 cm  LV Volumes (MOD) LV vol d, MOD A2C: 68.8 ml LV vol d,  MOD A4C: 104.0 ml LV vol s, MOD A2C: 51.5 ml LV vol s, MOD A4C: 76.3 ml LV SV MOD A2C:     17.3 ml LV SV MOD A4C:     104.0 ml LV SV MOD BP:      21.5 ml RIGHT VENTRICLE             IVC RV Basal diam:  3.60 cm     IVC diam: 2.30 cm RV Mid diam:    1.90 cm RV S prime:     11.00 cm/s TAPSE (M-mode): 1.6 cm LEFT ATRIUM              Index       RIGHT ATRIUM           Index LA diam:        4.10 cm  2.13 cm/m  RA Area:     23.30 cm LA Vol (A2C):   155.0 ml 80.60 ml/m RA Volume:   65.80 ml  34.21 ml/m LA Vol (A4C):   128.0 ml 66.56 ml/m LA Biplane Vol: 142.0 ml 73.84 ml/m  AORTIC VALVE LVOT Vmax:   91.78 cm/s LVOT Vmean:  59.840 cm/s LVOT VTI:    0.157 m AI PHT:      953 msec  AORTA Ao Root diam: 3.30 cm Ao Asc diam:  4.00 cm TRICUSPID VALVE TR Peak grad:   17.1 mmHg TR Vmax:        207.00 cm/s  SHUNTS Systemic VTI:  0.16 m Systemic Diam: 2.10 cm Kirk Ruths MD Electronically signed by Kirk Ruths MD Signature Date/Time: 08/22/2020/6:31:36 PM    Final     Cardiac Studies   2D echo 08/22/2020 IMPRESSIONS    1. Left ventricular ejection fraction, by estimation, is 25 to 30%. The  left ventricle has severely decreased function. The left ventricle  demonstrates  global hypokinesis. There is moderate left ventricular  hypertrophy of the basal-septal segment. Left  ventricular diastolic function could not be evaluated.  2. Right ventricular systolic function is normal. The right ventricular  size is normal. Tricuspid regurgitation signal is inadequate for assessing  PA pressure.  3. Left atrial size was severely dilated.  4. Right atrial size was mildly dilated.  5. The mitral valve is normal in structure. Mild mitral valve  regurgitation. No evidence of mitral stenosis.  6. The aortic valve is tricuspid. Aortic valve regurgitation is mild.  Mild aortic valve sclerosis is present, with no evidence of aortic valve  stenosis.  7. Aortic dilatation noted. There is mild dilatation of the  ascending  aorta, measuring 40 mm.  8. The inferior vena cava is normal in size with greater than 50%  respiratory variability, suggesting right atrial pressure of 3 mmHg.   Patient Profile     79 y.o. male with a hx of HTN and GERD but no prior cardiac disease.  Recently he has experienced what was felt to be TIAs on 3 separate occasions from October to 08/02/2020 with normal MRI of the brain.  He recently started having increased cough and DOE and was seen by his allergist in the office 3 days ago and was told to see his PCP or ER but never went until today.  In ER today he felt palpitations and was found to be in afib with RVR.    Assessment & Plan    1.  Atrial fibrillation with RVR -new dx for him and unknown duration -TSH normal -started on IV Cardizem gtt and now on 15mg /hr and HR still in the 120's -BP 140/42mmHg -increased Lopressor to 50mg  BID yesterday but since BP elevated will increase further to 75mg  BID -stop IV Cardizem due to LV dysfunction -start IV Amio for HR control -continue on IV Heparin gtt for now as iron panel came back low with Iron 23, Fe sat 6 and ferritin 8>>GI had planned for endoscopy today to address low Fe panel before we start on Eliquis given hx of Anemia in the past as low as 5 per patient>>endoscopy on hold due to patient falling and dislocating his shoulder -once cleared by GI will start Eliquis 5mg  BID for CHADS2VASC score of 5 -if we can get rate controlled then would need 3-4 weeks of Eliquis with no missed doses prior to DCCV -if we cannot get rate controlled then will need TEE/DCCV prior to discharge  2.  TIAs -he has had several TIAs in the past 2 months and likely related to #1 -on  ASA  3.  Cardiomegaly/Acute combined systolic/diastolic CHF -noted on Cxray -2D echo with severe LV dysfunction EF 25-30% which is new with global LV dysfunction -he put out 301cc and is net neg 1.7 -weight up 6lbs today -will give a dose of Lasix 40mg  IV  once and reassess in am -would benefit from addition of ARB but will hold on this for now as we need to titrate BB for HR control -DCM may be tachy related -will need outpt ischemic workup -ultimately needs to be on ANRi and spiro -follow strict I&O's  4.  HTN -DBP elevated and increasing Lopressor -stopping Cardizem gtt due to LV dysfunction -start ARB as needed for BP control    I have spent a total of 35 minutes with patient reviewing, discussing anemia labs with primary telemetry, EKGs, labs and examining patient as well as establishing an assessment and plan  that was discussed with the patient.  > 50% of time was spent in direct patient care.    For questions or updates, please contact [place] Please consult [email or web address] for contact info under        Signed, Fransico Him, MD  08/23/2020, 7:35 AM

## 2020-08-23 NOTE — Consult Note (Signed)
ORTHOPAEDIC CONSULTATION  REQUESTING PHYSICIAN: Nolberto Hanlon, MD  Chief Complaint: Left shoulder dislocation  HPI: Tony Roberts is a 79 y.o. male with HTN, GERD with afib with RVR got out of bed last night and fell onto left shoulder.  Experienced immediate pain and deformity.  xrays showed dislocated left shoulder.  Ortho consulted.  Past Medical History:  Diagnosis Date  . Arthritis   . Asthma    last asthma attack at age 79  . GERD (gastroesophageal reflux disease)   . Headache   . Other specified iron deficiency anemias   . PAC (premature atrial contraction)    occasional PAC's   Past Surgical History:  Procedure Laterality Date  . CATARACT EXTRACTION    . HERNIA REPAIR    . SPINE SURGERY  10/29/2016  . TONSILLECTOMY     Social History   Socioeconomic History  . Marital status: Married    Spouse name: Not on file  . Number of children: Not on file  . Years of education: Not on file  . Highest education level: Not on file  Occupational History  . Not on file  Tobacco Use  . Smoking status: Never Smoker  . Smokeless tobacco: Never Used  Vaping Use  . Vaping Use: Never used  Substance and Sexual Activity  . Alcohol use: No  . Drug use: No  . Sexual activity: Not on file  Other Topics Concern  . Not on file  Social History Narrative  . Not on file   Social Determinants of Health   Financial Resource Strain:   . Difficulty of Paying Living Expenses: Not on file  Food Insecurity:   . Worried About Charity fundraiser in the Last Year: Not on file  . Ran Out of Food in the Last Year: Not on file  Transportation Needs:   . Lack of Transportation (Medical): Not on file  . Lack of Transportation (Non-Medical): Not on file  Physical Activity:   . Days of Exercise per Week: Not on file  . Minutes of Exercise per Session: Not on file  Stress:   . Feeling of Stress : Not on file  Social Connections:   . Frequency of Communication with Friends and Family:  Not on file  . Frequency of Social Gatherings with Friends and Family: Not on file  . Attends Religious Services: Not on file  . Active Member of Clubs or Organizations: Not on file  . Attends Archivist Meetings: Not on file  . Marital Status: Not on file   Family History  Problem Relation Age of Onset  . Alzheimer's disease Mother   . Asthma Father    - negative except otherwise stated in the family history section No Known Allergies Prior to Admission medications   Medication Sig Start Date End Date Taking? Authorizing Provider  ADVAIR DISKUS 250-50 MCG/DOSE AEPB INHALE 1 PUFF BY MOUTH ONCE TO TWICE A DAY TO PREVENT COUGH OR WHEEZE. RINSE, GARGLE, AND SPIT AFTER USE. Patient taking differently: Inhale 1 puff into the lungs See admin instructions. Inhale 1 puff into the lungs at bedtime and an additional 1 puff in the morning as needed for coughing, wheezing, or shortness of breath 04/01/20  Yes Kozlow, Donnamarie Poag, MD  albuterol (PROVENTIL HFA) 108 (90 Base) MCG/ACT inhaler Inhale two puffs every four to six hours as needed for cough or wheeze. Patient taking differently: Inhale 2 puffs into the lungs See admin instructions. Inhale 2 puffs into the  lungs every four to six hours as needed for coughing or wheezing 05/19/16  Yes Kozlow, Donnamarie Poag, MD  amLODipine (NORVASC) 5 MG tablet Take 5 mg by mouth at bedtime.  12/18/15  Yes [provider]  esomeprazole (NEXIUM) 40 MG capsule Take 40 mg by mouth See admin instructions. Take 40 mg by mouth in the morning before breakfast and an additional 40 mg once a day as needed for recurring heartburn   Yes [provider]  famotidine (PEPCID) 40 MG tablet Take 40 mg by mouth at bedtime.  03/17/19  Yes [provider]  montelukast (SINGULAIR) 10 MG tablet TAKE 1 TABLET (10 MG TOTAL) BY MOUTH AT BEDTIME. 04/23/20  Yes Kozlow, Donnamarie Poag, MD  triamcinolone (NASACORT ALLERGY 24HR) 55 MCG/ACT AERO nasal inhaler Place 1 spray into the  nose daily as needed (for allergies or rhinitis).    Yes [provider]  omeprazole (PRILOSEC) 40 MG capsule Take 1 capsule (40 mg total) by mouth in the morning and at bedtime. Patient not taking: Reported on 08/21/2020 01/23/20   Jiles Prows, MD   CT HEAD WO CONTRAST  Result Date: 08/23/2020 CLINICAL DATA:  Head trauma EXAM: CT HEAD WITHOUT CONTRAST CT CERVICAL SPINE WITHOUT CONTRAST TECHNIQUE: Multidetector CT imaging of the head and cervical spine was performed following the standard protocol without intravenous contrast. Multiplanar CT image reconstructions of the cervical spine were also generated. COMPARISON:  None. FINDINGS: CT HEAD FINDINGS Brain: There is no mass, hemorrhage or extra-axial collection. The size and configuration of the ventricles and extra-axial CSF spaces are normal. The brain parenchyma is normal, without evidence of acute or chronic infarction. Vascular: Atherosclerotic calcification of the vertebral and internal carotid arteries at the skull base. No abnormal hyperdensity of the major intracranial arteries or dural venous sinuses. Skull: The visualized skull base, calvarium and extracranial soft tissues are normal. Sinuses/Orbits: No fluid levels or advanced mucosal thickening of the visualized paranasal sinuses. No mastoid or middle ear effusion. The orbits are normal. CT CERVICAL SPINE FINDINGS Alignment: No static subluxation. Facets are aligned. Occipital condyles are normally positioned. Skull base and vertebrae: No acute fracture. Soft tissues and spinal canal: No prevertebral fluid or swelling. No visible canal hematoma. Disc levels: No advanced spinal canal or neural foraminal stenosis. Upper chest: No pneumothorax, pulmonary nodule or pleural effusion. Other: Incidentally noted Klippel-Feil configuration. IMPRESSION: 1. No acute intracranial abnormality. 2. No acute fracture or static subluxation of the cervical spine. Electronically Signed   By: Ulyses Jarred  M.D.   On: 08/23/2020 00:39   CT CERVICAL SPINE WO CONTRAST  Result Date: 08/23/2020 CLINICAL DATA:  Head trauma EXAM: CT HEAD WITHOUT CONTRAST CT CERVICAL SPINE WITHOUT CONTRAST TECHNIQUE: Multidetector CT imaging of the head and cervical spine was performed following the standard protocol without intravenous contrast. Multiplanar CT image reconstructions of the cervical spine were also generated. COMPARISON:  None. FINDINGS: CT HEAD FINDINGS Brain: There is no mass, hemorrhage or extra-axial collection. The size and configuration of the ventricles and extra-axial CSF spaces are normal. The brain parenchyma is normal, without evidence of acute or chronic infarction. Vascular: Atherosclerotic calcification of the vertebral and internal carotid arteries at the skull base. No abnormal hyperdensity of the major intracranial arteries or dural venous sinuses. Skull: The visualized skull base, calvarium and extracranial soft tissues are normal. Sinuses/Orbits: No fluid levels or advanced mucosal thickening of the visualized paranasal sinuses. No mastoid or middle ear effusion. The orbits are normal. CT CERVICAL SPINE  FINDINGS Alignment: No static subluxation. Facets are aligned. Occipital condyles are normally positioned. Skull base and vertebrae: No acute fracture. Soft tissues and spinal canal: No prevertebral fluid or swelling. No visible canal hematoma. Disc levels: No advanced spinal canal or neural foraminal stenosis. Upper chest: No pneumothorax, pulmonary nodule or pleural effusion. Other: Incidentally noted Klippel-Feil configuration. IMPRESSION: 1. No acute intracranial abnormality. 2. No acute fracture or static subluxation of the cervical spine. Electronically Signed   By: Ulyses Jarred M.D.   On: 08/23/2020 00:39   DG Chest Port 1 View  Result Date: 08/21/2020 CLINICAL DATA:  Shortness of breath and palpitation EXAM: PORTABLE CHEST 1 VIEW COMPARISON:  10/23/2016 FINDINGS: Cardiomegaly and aortic  tortuosity. Cardiomegaly appears new/progressed. Suspect small right pleural effusion. Mild interstitial prominence that is similar to prior. No pneumothorax. IMPRESSION: 1. Cardiopericardial enlargement. 2. Small right pleural effusion. Electronically Signed   By: Monte Fantasia M.D.   On: 08/21/2020 11:37   DG Shoulder Left Port  Result Date: 08/22/2020 CLINICAL DATA:  Recent fall with left shoulder pain and deformity, initial encounter EXAM: LEFT SHOULDER COMPARISON:  None. FINDINGS: Anterior inferior dislocation of the humeral head with respect to the glenoid is noted. No acute fracture is seen. The underlying bony thorax and remainder of the shoulder girdle appear within normal limits. IMPRESSION: Anterior inferior dislocation of the left humeral head as described. Electronically Signed   By: Inez Catalina M.D.   On: 08/22/2020 22:36   ECHOCARDIOGRAM COMPLETE  Result Date: 08/22/2020    ECHOCARDIOGRAM REPORT   Patient Name:   MONTE ZINNI Alfred Date of Exam: 08/22/2020 Medical Rec #:  [id number]      Height:       65.0 in Accession #:    [id number]     Weight:       187.2 lb Date of Birth:  Feb 02, 1941      BSA:          1.923 m Patient Age:    1 years       BP:           126/96 mmHg Patient Gender: M              HR:           96 bpm. Exam Location:  Inpatient Procedure: 2D Echo, Cardiac Doppler and Color Doppler Indications:    Atrial fibrillation  History:        Patient has no prior history of Echocardiogram examinations.  Sonographer:    Clayton Lefort RDCS (AE) Referring Phys: [id number] [place]  1. Left ventricular ejection fraction, by estimation, is 25 to 30%. The left ventricle has severely decreased function. The left ventricle demonstrates global hypokinesis. There is moderate left ventricular hypertrophy of the basal-septal segment. Left ventricular diastolic function could not be evaluated.  2. Right ventricular systolic function is normal. The right ventricular size is normal.  Tricuspid regurgitation signal is inadequate for assessing PA pressure.  3. Left atrial size was severely dilated.  4. Right atrial size was mildly dilated.  5. The mitral valve is normal in structure. Mild mitral valve regurgitation. No evidence of mitral stenosis.  6. The aortic valve is tricuspid. Aortic valve regurgitation is mild. Mild aortic valve sclerosis is present, with no evidence of aortic valve stenosis.  7. Aortic dilatation noted. There is mild dilatation of the ascending aorta, measuring 40 mm.  8. The inferior vena cava is normal in size with greater than 50% respiratory  variability, suggesting right atrial pressure of 3 mmHg. FINDINGS  Left Ventricle: Left ventricular ejection fraction, by estimation, is 25 to 30%. The left ventricle has severely decreased function. The left ventricle demonstrates global hypokinesis. The left ventricular internal cavity size was normal in size. There is moderate left ventricular hypertrophy of the basal-septal segment. Left ventricular diastolic function could not be evaluated due to atrial fibrillation. Left ventricular diastolic function could not be evaluated. Right Ventricle: The right ventricular size is normal. Right ventricular systolic function is normal. Tricuspid regurgitation signal is inadequate for assessing PA pressure. The tricuspid regurgitant velocity is 2.07 m/s, and with an assumed right atrial  pressure of 15 mmHg, the estimated right ventricular systolic pressure is 35.5 mmHg. Left Atrium: Left atrial size was severely dilated. Right Atrium: Right atrial size was mildly dilated. Pericardium: There is no evidence of pericardial effusion. Mitral Valve: The mitral valve is normal in structure. Mild mitral valve regurgitation. No evidence of mitral valve stenosis. Tricuspid Valve: The tricuspid valve is normal in structure. Tricuspid valve regurgitation is mild . No evidence of tricuspid stenosis. Aortic Valve: The aortic valve is tricuspid. Aortic  valve regurgitation is mild. Aortic regurgitation PHT measures 953 msec. Mild aortic valve sclerosis is present, with no evidence of aortic valve stenosis. Pulmonic Valve: The pulmonic valve was not well visualized. Pulmonic valve regurgitation is not visualized. No evidence of pulmonic stenosis. Aorta: Aortic dilatation noted. There is mild dilatation of the ascending aorta, measuring 40 mm. Venous: The inferior vena cava is normal in size with greater than 50% respiratory variability, suggesting right atrial pressure of 3 mmHg. IAS/Shunts: No atrial level shunt detected by color flow Doppler.  LEFT VENTRICLE PLAX 2D LVIDd:         4.40 cm LVIDs:         3.60 cm LV PW:         1.80 cm LV IVS:        1.70 cm LVOT diam:     2.10 cm LV SV:         55 LV SV Index:   28 LVOT Area:     3.46 cm  LV Volumes (MOD) LV vol d, MOD A2C: 68.8 ml LV vol d, MOD A4C: 104.0 ml LV vol s, MOD A2C: 51.5 ml LV vol s, MOD A4C: 76.3 ml LV SV MOD A2C:     17.3 ml LV SV MOD A4C:     104.0 ml LV SV MOD BP:      21.5 ml RIGHT VENTRICLE             IVC RV Basal diam:  3.60 cm     IVC diam: 2.30 cm RV Mid diam:    1.90 cm RV S prime:     11.00 cm/s TAPSE (M-mode): 1.6 cm LEFT ATRIUM              Index       RIGHT ATRIUM           Index LA diam:        4.10 cm  2.13 cm/m  RA Area:     23.30 cm LA Vol (A2C):   155.0 ml 80.60 ml/m RA Volume:   65.80 ml  34.21 ml/m LA Vol (A4C):   128.0 ml 66.56 ml/m LA Biplane Vol: 142.0 ml 73.84 ml/m  AORTIC VALVE LVOT Vmax:   91.78 cm/s LVOT Vmean:  59.840 cm/s LVOT VTI:    0.157 m AI PHT:  953 msec  AORTA Ao Root diam: 3.30 cm Ao Asc diam:  4.00 cm TRICUSPID VALVE TR Peak grad:   17.1 mmHg TR Vmax:        207.00 cm/s  SHUNTS Systemic VTI:  0.16 m Systemic Diam: 2.10 cm Kirk Ruths MD Electronically signed by Kirk Ruths MD Signature Date/Time: 08/22/2020/6:31:36 PM    Final    - pertinent xrays, CT, MRI studies were reviewed and independently interpreted  Positive ROS: All other systems have  been reviewed and were otherwise negative with the exception of those mentioned in the HPI and as above.  Physical Exam: General: No acute distress Cardiovascular: No pedal edema Respiratory: No cyanosis, no use of accessory musculature GI: No organomegaly, abdomen is soft and non-tender Skin: No lesions in the area of chief complaint Neurologic: Sensation intact distally Psychiatric: Patient is at baseline mood and affect Lymphatic: No axillary or cervical lymphadenopathy  MUSCULOSKELETAL:  - light bruising in the axilla - NVI distally  Assessment: Left shoulder dislocation  Plan: - patient has been NPO since midnight originally for endoscopy but this has been cancelled because he is unable to lay on his left side - patient is comfortable upon evaluation this morning, I discussed that we will need to reduce the shoulder this morning - he is agreeable to the procedure  Thank you for the consult and the opportunity to see Mr. Nori Riis. Eduard Roux, MD [facility] 7:45 AM

## 2020-08-23 NOTE — Progress Notes (Signed)
[name] 10:00 AM  Subjective: Patient without any new GI complaints and events of last night noted he is unable to lie on his left side for the endoscopy and he needs to go to the OR for his shoulder he has no other complaints  Objective: Vital signs stable afebrile no acute distress exam unchanged labs stable  Assessment: Iron deficiency questionable etiology  Plan: Okay to hold GI work-up for now and can follow-up with his primary gastroenterologist Dr. Cristina Gong in a month or 2 and decide about repeat work-up at that time and in the meantime we will treat with iron and pump inhibitors and happy to proceed with work-up sooner if signs of bleeding from blood thinners and please call my rounding partners this weekend if any question or problem  [facility]  office [phone] After 5PM or if no answer call [phone]

## 2020-08-23 NOTE — Plan of Care (Signed)

## 2020-08-23 NOTE — Progress Notes (Signed)
Heart Failure Stewardship Pharmacist Progress Note   PCP: Alroy Dust, L.Marlou Sa, MD PCP-Cardiologist: No primary care provider on file.    HPI:  79 yo M with PMH of HTN and GERD. He presented to the ED on 08/21/20 with DOE, increased cough, and palpitations. He was found to be in afib with RVR. An ECHO was done on 08/22/20 and LVEF is 25-30%.  Current HF Medications: Furosemide 40 mg IV x 1 Metoprolol tartrate 75 mg BID  Prior to admission HF Medications: None  Pertinent Lab Values: . Serum creatinine 1.13, BUN 19, Potassium 3.7, Sodium 136, BNP 328, Magnesium 2.1   Vital Signs: . Weight: 193 lbs (admission weight: 187 lbs) . Blood pressure: 120-140/80-100s  . Heart rate: 110s   Medication Assistance / Insurance Benefits Check: Does the patient have prescription insurance?  Yes Type of insurance plan: UHC Medicare  Does the patient qualify for medication assistance through manufacturers or grants?   Pending household income information . Eligible grants and/or patient assistance programs: TBD . Medication assistance applications in progress: none  . Medication assistance applications approved: none Approved medication assistance renewals will be completed by: [place]:  Prior to admission outpatient pharmacy: [facility] outpatient pharmacy Is the patient willing to use [place] at discharge? Yes Is the patient willing to transition their outpatient pharmacy to utilize a [facility] outpatient pharmacy?   Yes    Assessment: 1. Acute systolic CHF (EF 96-29%), likely due to afib with RVR. NYHA class II symptoms. - Continue furosemide 40 mg IV. Consider replacing potassium for goal >4.0 - Metoprolol increased from 50 mg BID to 75 mg BID today. Consider transitioning to XL once max-tolerated dose achieved. - Consider starting ARB/Entresto given new low EF and BP room even with up-titrating BB - Consider starting Farxiga +/- spironolactone prior to discharge - Not a  candidate for ivabradine for further HR reduction given concurrent afib - Agree with stopping diltiazem given LV dysfunction   Plan: 1) Medication changes recommended at this time: - Add ARB vs Entresto today - Agree with increased dose of metoprolol - Replace potassium  2) Patient assistance application(s): - Entresto copay $47 per month - Farxiga copay $47 per month Can help enroll patient in patient assistance with drug manufacturers to bring copay down to $0 per fill for both Entresto and Farxiga  3)  Education  - To be completed prior to discharge  Kerby Nora, PharmD, BCPS Heart Failure Cytogeneticist Phone [phone]

## 2020-08-23 NOTE — Progress Notes (Addendum)
PROGRESS NOTE    Tony Roberts  GGY:[id number] DOB: 04/29/1941 DOA: 08/21/2020 PCP: Alroy Dust, L.Marlou Sa, MD    Brief Narrative:  Tony Roberts is a 79 y.o. male with medical history significant of HTN, GERD, asthma, diverticulosis, presented with new onset of palpitations.  Symptoms started 4 to 5 days ago, with persistent palpitations, exertional shortness of breath and worsening of cough (chronic cough secondary to asthma, for which patient been following with allergist).  Patient went to see allergist 3 days ago, was found in rapid A. fib.  Denied any chest pain, no leg swelling no fever chills weight loss.Before this, patient has had 3 TIAs in last 1 month.  Each episode last about 30 minutes, symptoms was one-sided numbness and facial droop and speech problems.  Symptoms resolved spontaneously.  Patient went to see PCP after the first episode, and MRI was done on 11/15, with no acute stroke but tiny chronic infarct suspected in left cerebellum.   12/2-tele afib rvr HR 110'-120   12/3- Apparently pt got out of bed last night and fell onto left shoulder.  experienced immediate pain and deformity.  xrays showed dislocated left shoulder.  Ortho was  Consulted.CT head negative. Still in afib rvr on amio gtt.   Consultants:   cardiology Orthopedics   Procedures:   Antimicrobials:       Subjective: S/p surgery , in deep sleep. Does not wake up for my exam.  Objective: Vitals:   08/23/20 0108 08/23/20 0200 08/23/20 0416 08/23/20 0736  BP: 137/90 (!) 133/106 (!) 140/98   Pulse: (!) 116 (!) 119 (!) 118 (!) 131  Resp: (!) 23 19 18 20   Temp: 97.8 F (36.6 C) 97.8 F (36.6 C) 98.1 F (36.7 C) 98.5 F (36.9 C)  TempSrc: Oral  Oral Oral  SpO2: 93% 91% 93% 96%  Weight:   87.6 kg   Height:        Intake/Output Summary (Last 24 hours) at 08/23/2020 0824 Last data filed at 08/23/2020 0421 Gross per 24 hour  Intake 253.29 ml  Output 301 ml  Net -47.71 ml   Filed Weights    08/22/20 0303 08/22/20 1256 08/23/20 0416  Weight: 84.9 kg 84.9 kg 87.6 kg    Examination: In deep sleep, comfortable appearing cta anteriorly Irregular mildly tachycardic s1/s2 Soft benign +bs No edema Neuro unable to assess     Data Reviewed: I have personally reviewed following labs and imaging studies  CBC: Recent Labs  Lab 08/21/20 1105 08/22/20 0104 08/23/20 0228  WBC 7.2 6.6 11.4*  NEUTROABS 5.3  --   --   HGB 11.6* 11.5* 10.8*  HCT 38.1* 36.3* 34.6*  MCV 80.4 78.1* 78.5*  PLT 152 168 035   Basic Metabolic Panel: Recent Labs  Lab 08/21/20 1105 08/21/20 1629 08/22/20 0104 08/23/20 0228  NA 139  --  141 136  K 3.4*  --  3.8 3.7  CL 105  --  105 104  CO2 21*  --  24 21*  GLUCOSE 135*  --  121* 152*  BUN 10  --  9 19  CREATININE 1.10  --  1.09 1.13  CALCIUM 8.8*  --  9.0 8.9  MG  --  2.1  --   --   PHOS  --  3.3  --   --    GFR: Estimated Creatinine Clearance: 53.9 mL/min (by C-G formula based on SCr of 1.13 mg/dL). Liver Function Tests: Recent Labs  Lab 08/21/20 1105  AST  26  ALT 20  ALKPHOS 64  BILITOT 1.4*  PROT 6.3*  ALBUMIN 3.5   No results for input(s): LIPASE, AMYLASE in the last 168 hours. No results for input(s): AMMONIA in the last 168 hours. Coagulation Profile: No results for input(s): INR, PROTIME in the last 168 hours. Cardiac Enzymes: No results for input(s): CKTOTAL, CKMB, CKMBINDEX, TROPONINI in the last 168 hours. BNP (last 3 results) No results for input(s): PROBNP in the last 8760 hours. HbA1C: Recent Labs    08/21/20 1629  HGBA1C 5.8*   CBG: No results for input(s): GLUCAP in the last 168 hours. Lipid Profile: Recent Labs    08/21/20 1629  CHOL 187  HDL 37*  LDLCALC 137*  TRIG 64  CHOLHDL 5.1   Thyroid Function Tests: Recent Labs    08/21/20 1343  TSH 4.301   Anemia Panel: Recent Labs    08/21/20 1629  FERRITIN 8*  TIBC 416  IRON 23*   Sepsis Labs: No results for input(s): PROCALCITON,  LATICACIDVEN in the last 168 hours.  Recent Results (from the past 240 hour(s))  Resp Panel by RT-PCR (Flu A&B, Covid) Nasopharyngeal Swab     Status: None   Collection Time: 08/21/20  2:18 PM   Specimen: Nasopharyngeal Swab; Nasopharyngeal(NP) swabs in vial transport medium  Result Value Ref Range Status   SARS Coronavirus 2 by RT PCR NEGATIVE NEGATIVE Final    Comment: (NOTE) SARS-CoV-2 target nucleic acids are NOT DETECTED.  The SARS-CoV-2 RNA is generally detectable in upper respiratory specimens during the acute phase of infection. The lowest concentration of SARS-CoV-2 viral copies this assay can detect is 138 copies/mL. A negative result does not preclude SARS-Cov-2 infection and should not be used as the sole basis for treatment or other patient management decisions. A negative result may occur with  improper specimen collection/handling, submission of specimen other than nasopharyngeal swab, presence of viral mutation(s) within the areas targeted by this assay, and inadequate number of viral copies(<138 copies/mL). A negative result must be combined with clinical observations, patient history, and epidemiological information. The expected result is Negative.  Fact Sheet for Patients:  EntrepreneurPulse.com.au  Fact Sheet for Healthcare Providers:  IncredibleEmployment.be  This test is no t yet approved or cleared by the [place] FDA and  has been authorized for detection and/or diagnosis of SARS-CoV-2 by FDA under an Emergency Use Authorization (EUA). This EUA will remain  in effect (meaning this test can be used) for the duration of the COVID-19 declaration under Section 564(b)(1) of the Act, 21 U.S.C.section 360bbb-3(b)(1), unless the authorization is terminated  or revoked sooner.       Influenza A by PCR NEGATIVE NEGATIVE Final   Influenza B by PCR NEGATIVE NEGATIVE Final    Comment: (NOTE) The Xpert Xpress  SARS-CoV-2/FLU/RSV plus assay is intended as an aid in the diagnosis of influenza from Nasopharyngeal swab specimens and should not be used as a sole basis for treatment. Nasal washings and aspirates are unacceptable for Xpert Xpress SARS-CoV-2/FLU/RSV testing.  Fact Sheet for Patients: EntrepreneurPulse.com.au  Fact Sheet for Healthcare Providers: IncredibleEmployment.be  This test is not yet approved or cleared by the [place] FDA and has been authorized for detection and/or diagnosis of SARS-CoV-2 by FDA under an Emergency Use Authorization (EUA). This EUA will remain in effect (meaning this test can be used) for the duration of the COVID-19 declaration under Section 564(b)(1) of the Act, 21 U.S.C. section 360bbb-3(b)(1), unless the authorization is terminated or revoked.  Performed at [facility], [place] [address], [place], [place] [address]          Radiology Studies: CT HEAD WO CONTRAST  Result Date: 08/23/2020 CLINICAL DATA:  Head trauma EXAM: CT HEAD WITHOUT CONTRAST CT CERVICAL SPINE WITHOUT CONTRAST TECHNIQUE: Multidetector CT imaging of the head and cervical spine was performed following the standard protocol without intravenous contrast. Multiplanar CT image reconstructions of the cervical spine were also generated. COMPARISON:  None. FINDINGS: CT HEAD FINDINGS Brain: There is no mass, hemorrhage or extra-axial collection. The size and configuration of the ventricles and extra-axial CSF spaces are normal. The brain parenchyma is normal, without evidence of acute or chronic infarction. Vascular: Atherosclerotic calcification of the vertebral and internal carotid arteries at the skull base. No abnormal hyperdensity of the major intracranial arteries or dural venous sinuses. Skull: The visualized skull base, calvarium and extracranial soft tissues are normal. Sinuses/Orbits: No fluid levels or advanced mucosal thickening of the  visualized paranasal sinuses. No mastoid or middle ear effusion. The orbits are normal. CT CERVICAL SPINE FINDINGS Alignment: No static subluxation. Facets are aligned. Occipital condyles are normally positioned. Skull base and vertebrae: No acute fracture. Soft tissues and spinal canal: No prevertebral fluid or swelling. No visible canal hematoma. Disc levels: No advanced spinal canal or neural foraminal stenosis. Upper chest: No pneumothorax, pulmonary nodule or pleural effusion. Other: Incidentally noted Klippel-Feil configuration. IMPRESSION: 1. No acute intracranial abnormality. 2. No acute fracture or static subluxation of the cervical spine. Electronically Signed   By: Ulyses Jarred M.D.   On: 08/23/2020 00:39   CT CERVICAL SPINE WO CONTRAST  Result Date: 08/23/2020 CLINICAL DATA:  Head trauma EXAM: CT HEAD WITHOUT CONTRAST CT CERVICAL SPINE WITHOUT CONTRAST TECHNIQUE: Multidetector CT imaging of the head and cervical spine was performed following the standard protocol without intravenous contrast. Multiplanar CT image reconstructions of the cervical spine were also generated. COMPARISON:  None. FINDINGS: CT HEAD FINDINGS Brain: There is no mass, hemorrhage or extra-axial collection. The size and configuration of the ventricles and extra-axial CSF spaces are normal. The brain parenchyma is normal, without evidence of acute or chronic infarction. Vascular: Atherosclerotic calcification of the vertebral and internal carotid arteries at the skull base. No abnormal hyperdensity of the major intracranial arteries or dural venous sinuses. Skull: The visualized skull base, calvarium and extracranial soft tissues are normal. Sinuses/Orbits: No fluid levels or advanced mucosal thickening of the visualized paranasal sinuses. No mastoid or middle ear effusion. The orbits are normal. CT CERVICAL SPINE FINDINGS Alignment: No static subluxation. Facets are aligned. Occipital condyles are normally positioned. Skull base  and vertebrae: No acute fracture. Soft tissues and spinal canal: No prevertebral fluid or swelling. No visible canal hematoma. Disc levels: No advanced spinal canal or neural foraminal stenosis. Upper chest: No pneumothorax, pulmonary nodule or pleural effusion. Other: Incidentally noted Klippel-Feil configuration. IMPRESSION: 1. No acute intracranial abnormality. 2. No acute fracture or static subluxation of the cervical spine. Electronically Signed   By: Ulyses Jarred M.D.   On: 08/23/2020 00:39   DG Chest Port 1 View  Result Date: 08/21/2020 CLINICAL DATA:  Shortness of breath and palpitation EXAM: PORTABLE CHEST 1 VIEW COMPARISON:  10/23/2016 FINDINGS: Cardiomegaly and aortic tortuosity. Cardiomegaly appears new/progressed. Suspect small right pleural effusion. Mild interstitial prominence that is similar to prior. No pneumothorax. IMPRESSION: 1. Cardiopericardial enlargement. 2. Small right pleural effusion. Electronically Signed   By: [place] Fantasia M.D.   On: 08/21/2020 11:37   DG Shoulder  Left Port  Result Date: 08/22/2020 CLINICAL DATA:  Recent fall with left shoulder pain and deformity, initial encounter EXAM: LEFT SHOULDER COMPARISON:  None. FINDINGS: Anterior inferior dislocation of the humeral head with respect to the glenoid is noted. No acute fracture is seen. The underlying bony thorax and remainder of the shoulder girdle appear within normal limits. IMPRESSION: Anterior inferior dislocation of the left humeral head as described. Electronically Signed   By: Inez Catalina M.D.   On: 08/22/2020 22:36   ECHOCARDIOGRAM COMPLETE  Result Date: 08/22/2020    ECHOCARDIOGRAM REPORT   Patient Name:   Tony Roberts Date of Exam: 08/22/2020 Medical Rec #:  [id number]      Height:       65.0 in Accession #:    [id number]     Weight:       187.2 lb Date of Birth:  [birth year]      BSA:          1.923 m Patient Age:    63 years       BP:           126/96 mmHg Patient Gender: M              HR:            96 bpm. Exam Location:  Inpatient Procedure: 2D Echo, Cardiac Doppler and Color Doppler Indications:    Atrial fibrillation  History:        Patient has no prior history of Echocardiogram examinations.  Sonographer:    Clayton Lefort RDCS (AE) Referring Phys: [id number] [place]  1. Left ventricular ejection fraction, by estimation, is 25 to 30%. The left ventricle has severely decreased function. The left ventricle demonstrates global hypokinesis. There is moderate left ventricular hypertrophy of the basal-septal segment. Left ventricular diastolic function could not be evaluated.  2. Right ventricular systolic function is normal. The right ventricular size is normal. Tricuspid regurgitation signal is inadequate for assessing PA pressure.  3. Left atrial size was severely dilated.  4. Right atrial size was mildly dilated.  5. The mitral valve is normal in structure. Mild mitral valve regurgitation. No evidence of mitral stenosis.  6. The aortic valve is tricuspid. Aortic valve regurgitation is mild. Mild aortic valve sclerosis is present, with no evidence of aortic valve stenosis.  7. Aortic dilatation noted. There is mild dilatation of the ascending aorta, measuring 40 mm.  8. The inferior vena cava is normal in size with greater than 50% respiratory variability, suggesting right atrial pressure of 3 mmHg. FINDINGS  Left Ventricle: Left ventricular ejection fraction, by estimation, is 25 to 30%. The left ventricle has severely decreased function. The left ventricle demonstrates global hypokinesis. The left ventricular internal cavity size was normal in size. There is moderate left ventricular hypertrophy of the basal-septal segment. Left ventricular diastolic function could not be evaluated due to atrial fibrillation. Left ventricular diastolic function could not be evaluated. Right Ventricle: The right ventricular size is normal. Right ventricular systolic function is normal. Tricuspid regurgitation  signal is inadequate for assessing PA pressure. The tricuspid regurgitant velocity is 2.07 m/s, and with an assumed right atrial  pressure of 15 mmHg, the estimated right ventricular systolic pressure is 36.6 mmHg. Left Atrium: Left atrial size was severely dilated. Right Atrium: Right atrial size was mildly dilated. Pericardium: There is no evidence of pericardial effusion. Mitral Valve: The mitral valve is normal in structure. Mild mitral valve regurgitation. No evidence of mitral  valve stenosis. Tricuspid Valve: The tricuspid valve is normal in structure. Tricuspid valve regurgitation is mild . No evidence of tricuspid stenosis. Aortic Valve: The aortic valve is tricuspid. Aortic valve regurgitation is mild. Aortic regurgitation PHT measures 953 msec. Mild aortic valve sclerosis is present, with no evidence of aortic valve stenosis. Pulmonic Valve: The pulmonic valve was not well visualized. Pulmonic valve regurgitation is not visualized. No evidence of pulmonic stenosis. Aorta: Aortic dilatation noted. There is mild dilatation of the ascending aorta, measuring 40 mm. Venous: The inferior vena cava is normal in size with greater than 50% respiratory variability, suggesting right atrial pressure of 3 mmHg. IAS/Shunts: No atrial level shunt detected by color flow Doppler.  LEFT VENTRICLE PLAX 2D LVIDd:         4.40 cm LVIDs:         3.60 cm LV PW:         1.80 cm LV IVS:        1.70 cm LVOT diam:     2.10 cm LV SV:         55 LV SV Index:   28 LVOT Area:     3.46 cm  LV Volumes (MOD) LV vol d, MOD A2C: 68.8 ml LV vol d, MOD A4C: 104.0 ml LV vol s, MOD A2C: 51.5 ml LV vol s, MOD A4C: 76.3 ml LV SV MOD A2C:     17.3 ml LV SV MOD A4C:     104.0 ml LV SV MOD BP:      21.5 ml RIGHT VENTRICLE             IVC RV Basal diam:  3.60 cm     IVC diam: 2.30 cm RV Mid diam:    1.90 cm RV S prime:     11.00 cm/s TAPSE (M-mode): 1.6 cm LEFT ATRIUM              Index       RIGHT ATRIUM           Index LA diam:        4.10 cm  2.13  cm/m  RA Area:     23.30 cm LA Vol (A2C):   155.0 ml 80.60 ml/m RA Volume:   65.80 ml  34.21 ml/m LA Vol (A4C):   128.0 ml 66.56 ml/m LA Biplane Vol: 142.0 ml 73.84 ml/m  AORTIC VALVE LVOT Vmax:   91.78 cm/s LVOT Vmean:  59.840 cm/s LVOT VTI:    0.157 m AI PHT:      953 msec  AORTA Ao Root diam: 3.30 cm Ao Asc diam:  4.00 cm TRICUSPID VALVE TR Peak grad:   17.1 mmHg TR Vmax:        207.00 cm/s  SHUNTS Systemic VTI:  0.16 m Systemic Diam: 2.10 cm Kirk Ruths MD Electronically signed by Kirk Ruths MD Signature Date/Time: 08/22/2020/6:31:36 PM    Final         Scheduled Meds: . amiodarone  150 mg Intravenous Once  . aspirin EC  81 mg Oral Daily  . famotidine  40 mg Oral QHS  . fluticasone furoate-vilanterol  1 puff Inhalation Daily  . furosemide  40 mg Intravenous Once  . metoprolol tartrate  75 mg Oral BID  . montelukast  10 mg Oral QHS  . pantoprazole  40 mg Oral Daily  . sodium chloride flush  3 mL Intravenous Q12H   Continuous Infusions: . sodium chloride    . amiodarone  Followed by  . amiodarone      Assessment & Plan:   Active Problems:   Atrial fibrillation with rapid ventricular response (HCC)   New onset A. fib with RVR -Heart rate still not well controlled.   Cardiology following-metoprolol increased from 50 twice daily to 75 twice daily Started on amiodarone drip Heparin gtt on hold, due to surgery this am. Contacted orthopedic about when to resume, awaiting response f if can get rate control will need 24 weeks on Eliquis with no missed doses prior to DCCV If cannot get rate controlled then will need TEE/DCCV prior to discharge cardizem gtt d/c'd...>started on amio gtt. Needs GI w/u, EGD for anemia inorder to start long term a/c with Eliquis EGD on hold due to pt falling and dislocating his shoulder Once cleared start Elqiuis 5mg  bid as CHADS2VASC score 5 Echo 25-30%   Cardiomyopathy-LVEF 25-30% found on echo on 08/22/2020 Possibly due to afib rvr  v.s. primary causing afib rvr.  On beta blk Continue Lasix 40 IV daily We will need to add Entresto and evetually aldactone Will need repeat echo as outpt once in SR/rate controlled to see if EF improved Monitor renal function/labs   S/p fall with left shoulder dislocation- last night, found with Left shoulder dislocation Ortho was consulted..>this am went for closed reduction  Of left arm in sling  Hypokalemia Supplemented and stable Continue to monitor  TIAs -recent -MRI -2 weeks ago. Likely 2/2 afib LDL goal <70  Chronic normocytic-microcytic anemia Fe 23,TIBC 416, Ferritin 8 Will contact GI since last scope was 8 yrs ago per pt. Also as we need to start Eliquis but need GI clearance. 12/3-GI w/u on hold as pt went to OR today as above GI recommended to follow-up with primary GI in a month or 2 and decide about repeat work-up at that time Meanwhile treat with iron and PPI GI is happy to proceed with work-up sooner for signs of bleeding blood thinners occurs Will start Fes04.  May need iv iron , but curently unable to get discuss with pt as he is in deep sleep from anesthesia   Asthma, mild intermittent No exacerbation. Continue inhalers  DVT prophylaxis: Heparin drip-on hold for surgery . Place on scd Code Status: Full Family Communication: None at bedside  Status is: Inpatient  Remains inpatient appropriate because:IV treatments appropriate due to intensity of illness or inability to take PO   Dispo: The patient is from: Home              Anticipated d/c is to: Home              Anticipated d/c date is: 2 days              Patient currently is not medically stable to d/c.            LOS: 2 days   Time spent: 45 minutes with more than 50% on [place], MD [facility] Pager [phone]  If 7PM-7AM, please contact night-coverage [email or web address] Password [facility] 08/23/2020, 8:24 AM

## 2020-08-23 NOTE — Progress Notes (Signed)
[place] for IV Heparin Indication: atrial fibrillation and recent TIAs  No Known Allergies  Patient Measurements: Weight 89.8 kg (from 08/19/20) Hight 5'5 (from 08/19/20) Heparin Dosing Weight: 81 kg  Vital Signs: Temp: 98.3 F (36.8 C) (12/03 1301) Temp Source: Oral (12/03 1301) BP: 115/84 (12/03 1301) Pulse Rate: 107 (12/03 1301)  Labs: Recent Labs    08/21/20 1105 08/21/20 1105 08/21/20 1343 08/22/20 0104 08/22/20 1135 08/23/20 0228  HGB 11.6*   < >  --  11.5*  --  10.8*  HCT 38.1*  --   --  36.3*  --  34.6*  PLT 152  --   --  168  --  158  HEPARINUNFRC  --   --   --  <0.10* 0.12* 0.14*  CREATININE 1.10  --   --  1.09  --  1.13  TROPONINIHS 10  --  10  --   --   --    < > = values in this interval not displayed.    Estimated Creatinine Clearance: 53.9 mL/min (by C-G formula based on SCr of 1.13 mg/dL).   Medical History: Past Medical History:  Diagnosis Date  . Arthritis   . Asthma    last asthma attack at age 50  . GERD (gastroesophageal reflux disease)   . Headache   . Other specified iron deficiency anemias   . PAC (premature atrial contraction)    occasional PAC's    Medications:  Medications Prior to Admission  Medication Sig Dispense Refill Last Dose  . ADVAIR DISKUS 250-50 MCG/DOSE AEPB INHALE 1 PUFF BY MOUTH ONCE TO TWICE A DAY TO PREVENT COUGH OR WHEEZE. RINSE, GARGLE, AND SPIT AFTER USE. (Patient taking differently: Inhale 1 puff into the lungs See admin instructions. Inhale 1 puff into the lungs at bedtime and an additional 1 puff in the morning as needed for coughing, wheezing, or shortness of breath) 180 each 0 08/20/2020 at Unknown time  . albuterol (PROVENTIL HFA) 108 (90 Base) MCG/ACT inhaler Inhale two puffs every four to six hours as needed for cough or wheeze. (Patient taking differently: Inhale 2 puffs into the lungs See admin instructions. Inhale 2 puffs into the lungs every four to six hours as  needed for coughing or wheezing) 1 Inhaler 1 unk  . amLODipine (NORVASC) 5 MG tablet Take 5 mg by mouth at bedtime.   11 08/20/2020 at pm  . esomeprazole (NEXIUM) 40 MG capsule Take 40 mg by mouth See admin instructions. Take 40 mg by mouth in the morning before breakfast and an additional 40 mg once a day as needed for recurring heartburn   08/21/2020 at am  . famotidine (PEPCID) 40 MG tablet Take 40 mg by mouth at bedtime.    08/19/2020 at pm  . montelukast (SINGULAIR) 10 MG tablet TAKE 1 TABLET (10 MG TOTAL) BY MOUTH AT BEDTIME. 90 tablet 0 08/20/2020 at pm  . triamcinolone (NASACORT ALLERGY 24HR) 55 MCG/ACT AERO nasal inhaler Place 1 spray into the nose daily as needed (for allergies or rhinitis).    unk  . omeprazole (PRILOSEC) 40 MG capsule Take 1 capsule (40 mg total) by mouth in the morning and at bedtime. (Patient not taking: Reported on 08/21/2020) 60 capsule 5 Not Taking at Unknown time    Assessment: 79 years of age male with history of several TIAs in the last month and admitted with atrial fibrillation to start IV Heparin per pharmacy dosing. Hgb/Hct are stable from  previous labs. Platelets are stable at 152. No bleeding reported. Patient was not on an anticoagulant prior to admission.   Patient unable to go for endoscopy given fall and inability to lay flat- heparin was stopped at 0300. Previous to stopping heparin level came back subtherapeutic at 0.14, on 1400 units/hr. Hgb 10.8, plt 158. No s/sx of bleeding.   Goal of Therapy:  Heparin level 0.3 to 0.5 units/ml  - lower goal due to multiple recent TIAs Monitor platelets by anticoagulation protocol: Yes   Plan:  F/u [facility] restart - MD aware and will alert pharmacy if okay for heparin infusion or change to [place], PharmD, [place] Pharmacist  Phone: [phone] 08/23/2020 1:59 PM  Please check AMION for all [place] phone numbers After 10:00 PM, call [place] [phone]

## 2020-08-24 ENCOUNTER — Encounter (HOSPITAL_COMMUNITY): Payer: Self-pay | Admitting: Orthopaedic Surgery

## 2020-08-24 ENCOUNTER — Inpatient Hospital Stay (HOSPITAL_COMMUNITY): Payer: Medicare Other

## 2020-08-24 DIAGNOSIS — I509 Heart failure, unspecified: Secondary | ICD-10-CM | POA: Diagnosis not present

## 2020-08-24 DIAGNOSIS — I420 Dilated cardiomyopathy: Secondary | ICD-10-CM

## 2020-08-24 DIAGNOSIS — I4891 Unspecified atrial fibrillation: Secondary | ICD-10-CM | POA: Diagnosis not present

## 2020-08-24 DIAGNOSIS — E876 Hypokalemia: Secondary | ICD-10-CM

## 2020-08-24 DIAGNOSIS — D508 Other iron deficiency anemias: Secondary | ICD-10-CM | POA: Diagnosis not present

## 2020-08-24 DIAGNOSIS — I517 Cardiomegaly: Secondary | ICD-10-CM | POA: Diagnosis not present

## 2020-08-24 LAB — BASIC METABOLIC PANEL
Anion gap: 14 (ref 5–15)
BUN: 18 mg/dL (ref 8–23)
CO2: 20 mmol/L — ABNORMAL LOW (ref 22–32)
Calcium: 8.8 mg/dL — ABNORMAL LOW (ref 8.9–10.3)
Chloride: 103 mmol/L (ref 98–111)
Creatinine, Ser: 0.98 mg/dL (ref 0.61–1.24)
GFR, Estimated: >60 mL/min (ref >60)
Glucose, Bld: 117 mg/dL — ABNORMAL HIGH (ref 70–99)
Potassium: 3.6 mmol/L (ref 3.5–5.1)
Sodium: 137 mmol/L (ref 135–145)

## 2020-08-24 LAB — CBC
HCT: 34.6 % — ABNORMAL LOW (ref 39.0–52.0)
Hemoglobin: 10.7 g/dL — ABNORMAL LOW (ref 13.0–17.0)
MCH: 24.4 pg — ABNORMAL LOW (ref 26.0–34.0)
MCHC: 30.9 g/dL (ref 30.0–36.0)
MCV: 78.8 fL — ABNORMAL LOW (ref 80.0–100.0)
Platelets: 140 10*3/uL — ABNORMAL LOW (ref 150–400)
RBC: 4.39 MIL/uL (ref 4.22–5.81)
RDW: 16.1 % — ABNORMAL HIGH (ref 11.5–15.5)
WBC: 8.4 10*3/uL (ref 4.0–10.5)
nRBC: 0 % (ref 0.0–0.2)

## 2020-08-24 LAB — HEPARIN LEVEL (UNFRACTIONATED)
Heparin Unfractionated: 0.2 IU/mL — ABNORMAL LOW (ref 0.30–0.70)
Heparin Unfractionated: 0.12 IU/mL — ABNORMAL LOW (ref 0.30–0.70)

## 2020-08-24 IMAGING — MR MR SHOULDER*L* W/O CM
4 of 5 series · 18 of 40 positions shown · non-contrast
Comparison: None.

CLINICAL DATA: Left shoulder pain status post dislocation. Fell
last night.

EXAM:
MRI OF THE LEFT SHOULDER WITHOUT CONTRAST
TECHNIQUE: Multiplanar, multisequence MR imaging of the shoulder was performed.
No intravenous contrast was administered.

[Series 4: T2 fat-sat · axial · 4.0mm · 0.31mm/px · z∈[-34,+66]mm · 4 of 28 slices shown (1 of 3)]
[im 1/28]
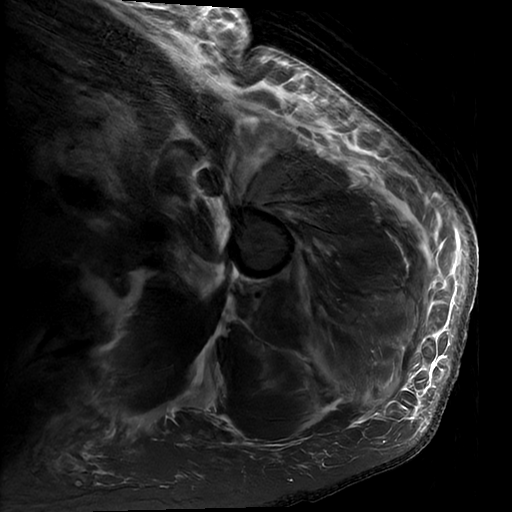
[im 4/28]
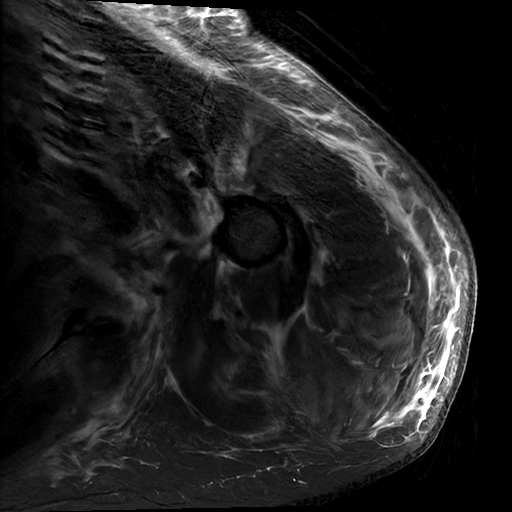
[im 16/28]
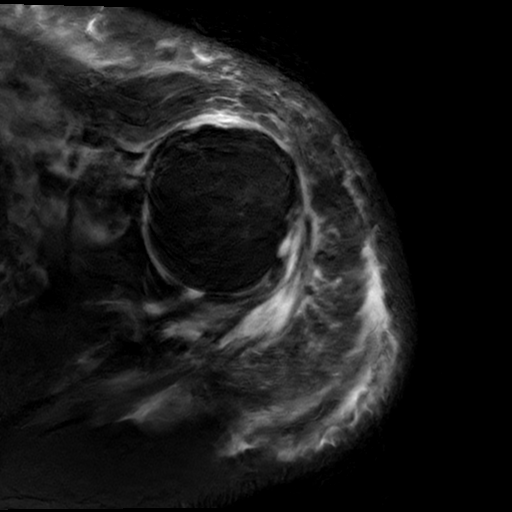
[im 24/28]
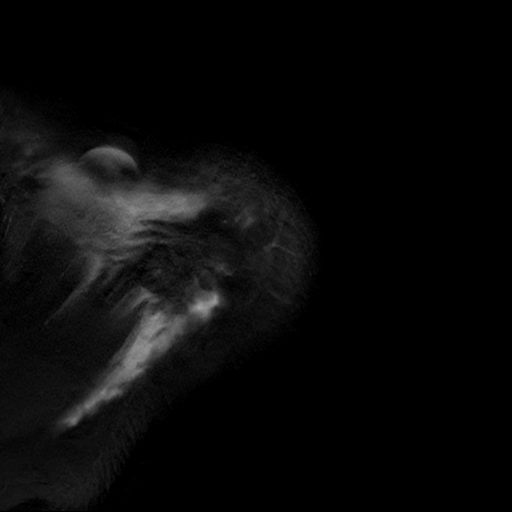

[Series 5: T2 fat-sat · coronal · 4.0mm · 0.31mm/px · 3 of 28 slices shown (2 of 3)]
[im 4/28]
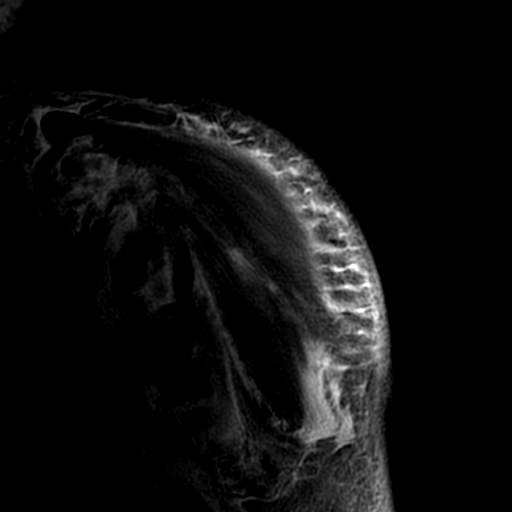
[im 16/28]
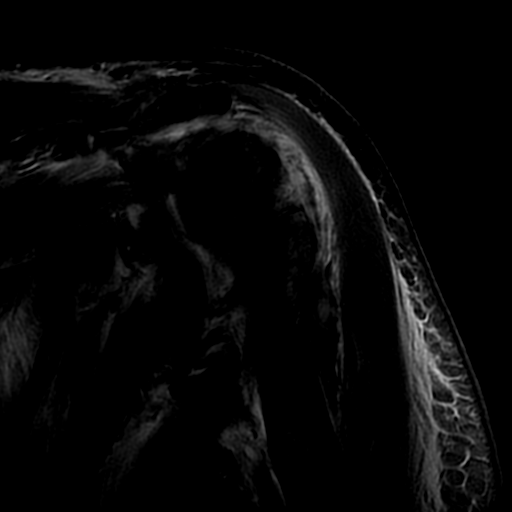
[im 24/28]
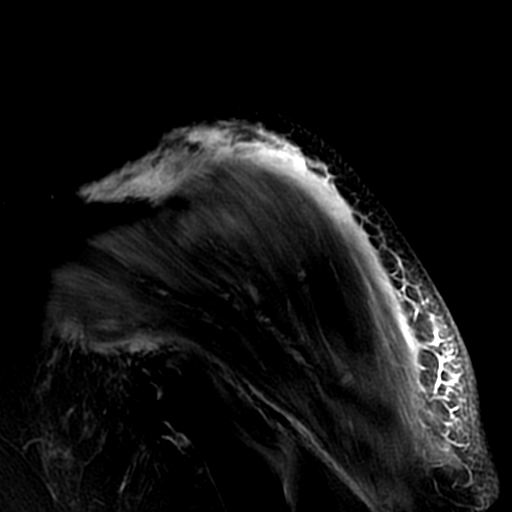

[Series 6: PD · coronal · 4.0mm · 0.31mm/px · 8 of 28 slices shown]
[im 1/28]
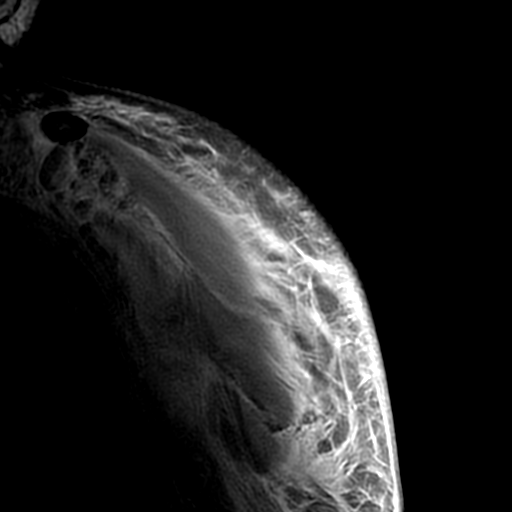
[im 4/28]
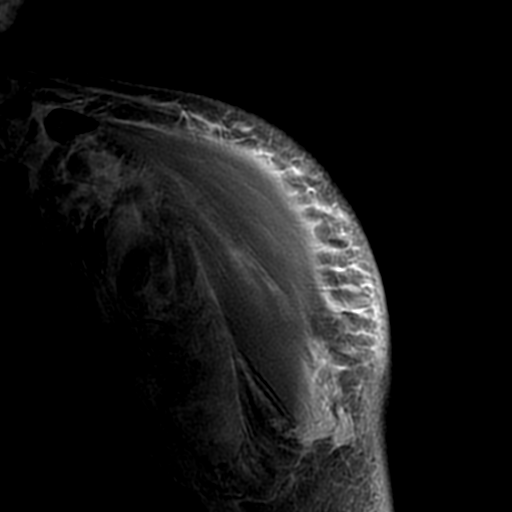
[im 8/28]
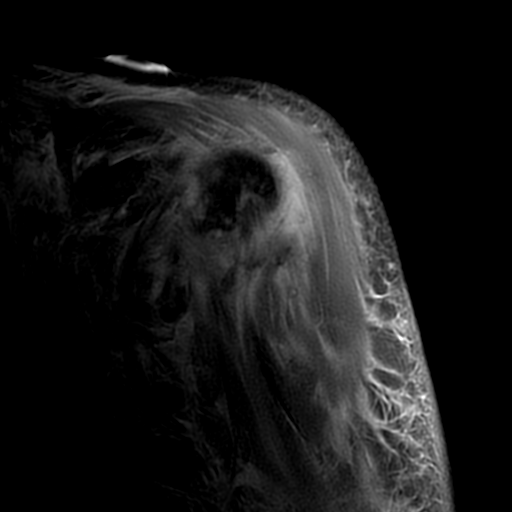
[im 12/28]
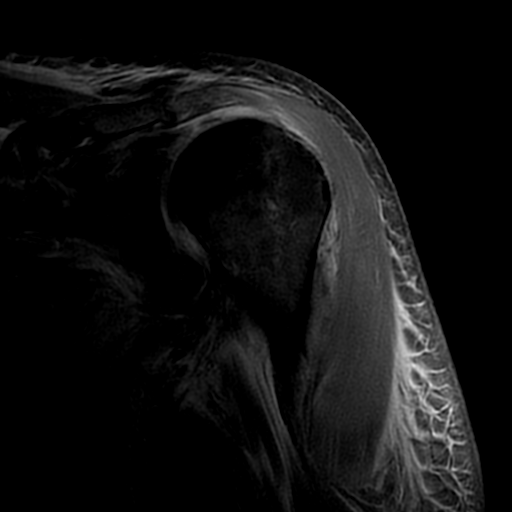
[im 16/28]
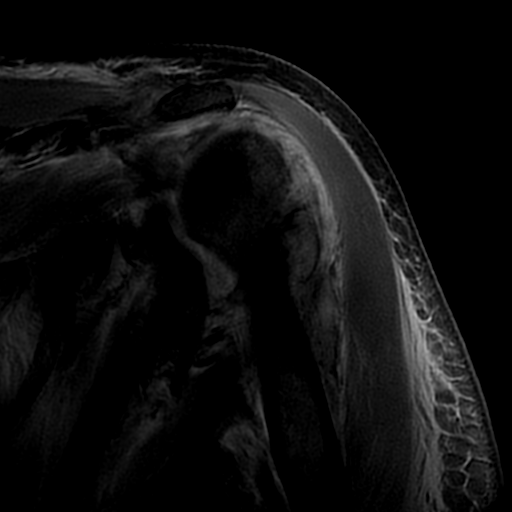
[im 20/28]
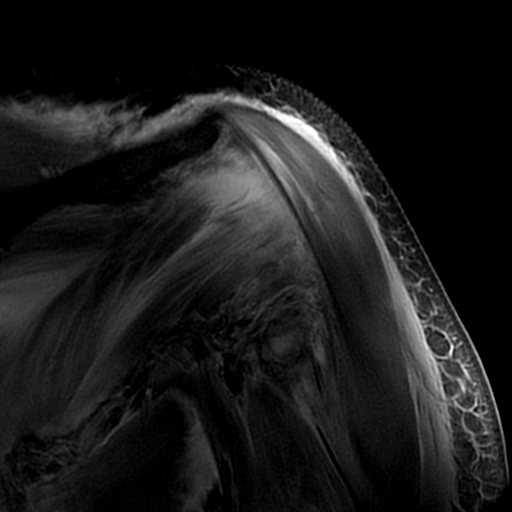
[im 24/28]
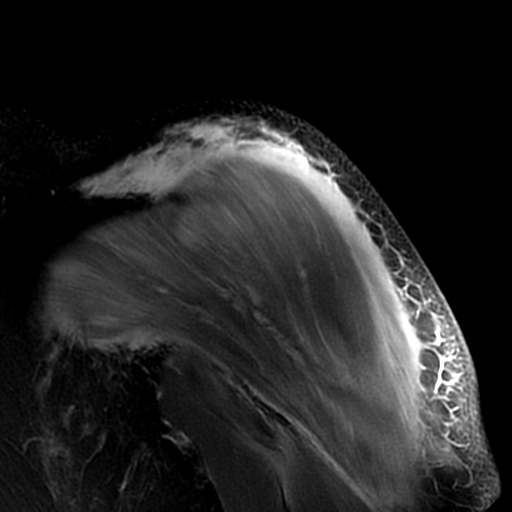
[im 28/28]
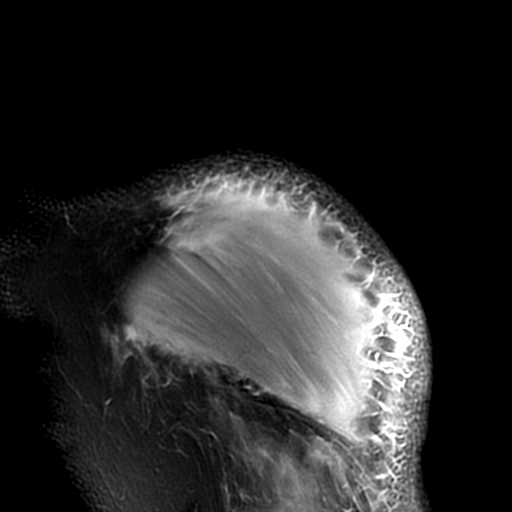

[Series 7: T2 fat-sat · oblique · 4.0mm · 0.31mm/px · 3 of 30 slices shown (3 of 3)]
[im 5/30]
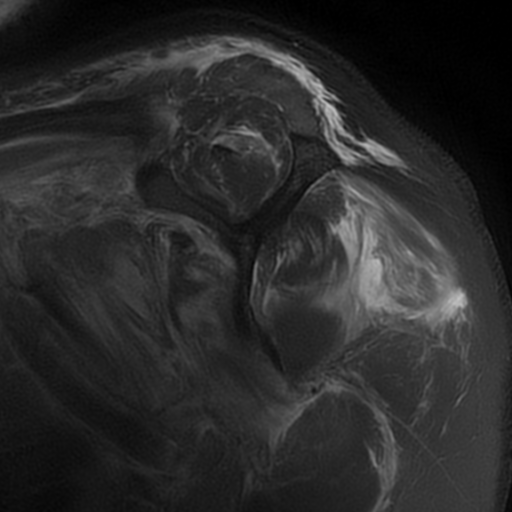
[im 17/30]
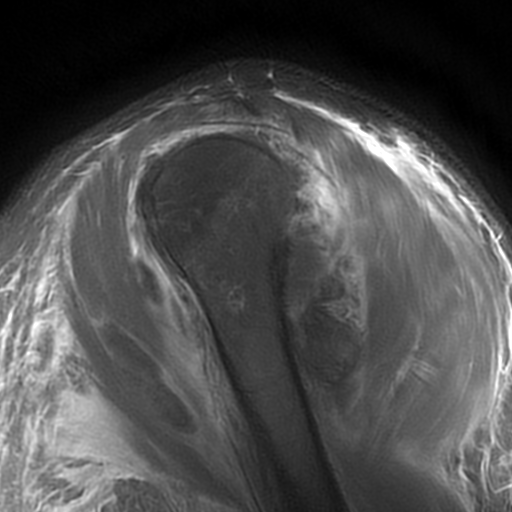
[im 25/30]
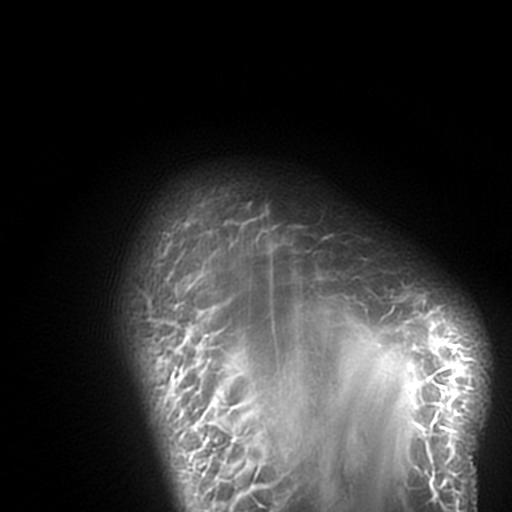

[18 of 40 positions shown; findings below may reference images not displayed]

FINDINGS: Rotator cuff: Complete tear of the supraspinatus and infraspinatus
tendons with 3.4 cm of retraction. Teres minor tendon is intact.
High-grade partial-thickness versus complete tear of the
subscapularis tendon.

Muscles: Muscle edema in the posterior deltoid muscle most
concerning for muscle strain. Muscle edema in the supraspinatus and
infraspinatus muscles most consistent with muscle strain with
perifascial edema. Muscle edema in the subscapularis muscle
consistent with muscle strain.

Biceps Long Head: Medial dislocation of the long head of the biceps
tendon from the bicipital groove.

Acromioclavicular Joint: Mild arthropathy of the acromioclavicular
joint. Type II acromion. Trace subacromial/subdeltoid bursal fluid.

Glenohumeral Joint: Small joint effusion. Attenuation of the humeral
attachment of the inferior glenohumeral ligament concerning for
injury without complete disruption. No chondral defect.

Labrum: Grossly intact, but evaluation is limited by lack of
intraarticular fluid/contrast.

Bones: No fracture or dislocation. No aggressive osseous lesion.
Mild osseous contusion of the superior posterolateral humeral head
as can be seen with a Hill-Sachs lesion.

Other: No fluid collection or hematoma.
IMPRESSION: 1. Complete tear of the supraspinatus and infraspinatus tendons with
3.4 cm of retraction.
2. High-grade partial-thickness versus complete tear of the
subscapularis tendon.
3. Muscle edema in the posterior deltoid muscle most concerning for
muscle strain.
4. Muscle edema in the supraspinatus and infraspinatus muscles most
consistent with muscle strain with perifascial edema.
5. Muscle edema in the subscapularis muscle consistent with muscle
strain.
6. Attenuation of the humeral attachment of the inferior
glenohumeral ligament concerning for injury without complete
disruption.
7. Osseous contusion of the superior posterior humeral head as can
be seen with a Hill-Sachs lesion as a result of the sequela of
anterior shoulder dislocation.

## 2020-08-24 MED ORDER — FERROUS SULFATE 325 (65 FE) MG PO TABS
325.0000 mg | ORAL_TABLET | Freq: Two times a day (BID) | ORAL | Status: DC
  Administered 2020-08-25 – 2020-08-28 (×7): 325 mg via ORAL
  Filled 2020-08-24 (×8): qty 1

## 2020-08-24 MED ORDER — FERROUS SULFATE 325 (65 FE) MG PO TABS: 325.0000 mg | ORAL_TABLET | Freq: Every day | ORAL | Status: DC

## 2020-08-24 MED ORDER — HEPARIN (PORCINE) 25000 UT/250ML-% IV SOLN: 1700.0000 [IU]/h | INTRAVENOUS | Status: AC

## 2020-08-24 MED ORDER — APIXABAN 5 MG PO TABS: 5.0000 mg | ORAL_TABLET | Freq: Two times a day (BID) | ORAL | Status: DC

## 2020-08-24 MED ORDER — APIXABAN 5 MG PO TABS
5.0000 mg | ORAL_TABLET | Freq: Two times a day (BID) | ORAL | Status: DC
  Administered 2020-08-24 – 2020-08-26 (×5): 5 mg via ORAL
  Filled 2020-08-24 (×5): qty 1

## 2020-08-24 NOTE — Progress Notes (Signed)
PROGRESS NOTE    Tony Roberts  UEA:[id number] DOB: 1941-03-13 DOA: 08/21/2020 PCP: Alroy Dust, L.Marlou Sa, MD    Brief Narrative:  Tony Roberts is a 79 y.o. male with medical history significant of HTN, GERD, asthma, diverticulosis, presented with new onset of palpitations.  Symptoms started 4 to 5 days ago, with persistent palpitations, exertional shortness of breath and worsening of cough (chronic cough secondary to asthma, for which patient been following with allergist).  Patient went to see allergist 3 days ago, was found in rapid A. fib.  Denied any chest pain, no leg swelling no fever chills weight loss.Before this, patient has had 3 TIAs in last 1 month.  Each episode last about 30 minutes, symptoms was one-sided numbness and facial droop and speech problems.  Symptoms resolved spontaneously.  Patient went to see PCP after the first episode, and MRI was done on 11/15, with no acute stroke but tiny chronic infarct suspected in left cerebellum.   12/3- Apparently pt got out of bed last night and fell onto left shoulder.  experienced immediate pain and deformity.  xrays showed dislocated left shoulder.  Ortho was  Consulted.Went for surgery.  12/4-still with afib rvr.    Consultants:   cardiology Orthopedics   Procedures:   Antimicrobials:       Subjective: Denies shoulder pain. Denies sob, or cp.   Objective: Vitals:   08/23/20 2350 08/24/20 0340 08/24/20 0354 08/24/20 0745  BP: (!) 130/97 (!) 118/99  (!) 123/95  Pulse: (!) 111 (!) 121  (!) 120  Resp: 15 16  20   Temp: 97.7 F (36.5 C) 98.6 F (37 C)  97.9 F (36.6 C)  TempSrc: Oral Oral  Oral  SpO2: 94% 96%  92%  Weight:   86.3 kg   Height:        Intake/Output Summary (Last 24 hours) at 08/24/2020 0749 Last data filed at 08/24/2020 0320 Gross per 24 hour  Intake 389.1 ml  Output 425 ml  Net -35.9 ml   Filed Weights   08/22/20 1256 08/23/20 0416 08/24/20 0354  Weight: 84.9 kg 87.6 kg 86.3 kg     Examination: Aaxox3, nad Decreased bs b/l, no wheezing Irregular, mildly tachy, s1/s2 Soft nt/nd+bs No edema     Data Reviewed: I have personally reviewed following labs and imaging studies  CBC: Recent Labs  Lab 08/21/20 1105 08/22/20 0104 08/23/20 0228 08/24/20 0327  WBC 7.2 6.6 11.4* 8.4  NEUTROABS 5.3  --   --   --   HGB 11.6* 11.5* 10.8* 10.7*  HCT 38.1* 36.3* 34.6* 34.6*  MCV 80.4 78.1* 78.5* 78.8*  PLT 152 168 158 478*   Basic Metabolic Panel: Recent Labs  Lab 08/21/20 1105 08/21/20 1629 08/22/20 0104 08/23/20 0228 08/24/20 0327  NA 139  --  141 136 137  K 3.4*  --  3.8 3.7 3.6  CL 105  --  105 104 103  CO2 21*  --  24 21* 20*  GLUCOSE 135*  --  121* 152* 117*  BUN 10  --  9 19 18   CREATININE 1.10  --  1.09 1.13 0.98  CALCIUM 8.8*  --  9.0 8.9 8.8*  MG  --  2.1  --   --   --   PHOS  --  3.3  --   --   --    GFR: Estimated Creatinine Clearance: 61.7 mL/min (by C-G formula based on SCr of 0.98 mg/dL). Liver Function Tests: Recent Labs  Lab  08/21/20 1105  AST 26  ALT 20  ALKPHOS 64  BILITOT 1.4*  PROT 6.3*  ALBUMIN 3.5   No results for input(s): LIPASE, AMYLASE in the last 168 hours. No results for input(s): AMMONIA in the last 168 hours. Coagulation Profile: No results for input(s): INR, PROTIME in the last 168 hours. Cardiac Enzymes: No results for input(s): CKTOTAL, CKMB, CKMBINDEX, TROPONINI in the last 168 hours. BNP (last 3 results) No results for input(s): PROBNP in the last 8760 hours. HbA1C: Recent Labs    08/21/20 1629  HGBA1C 5.8*   CBG: No results for input(s): GLUCAP in the last 168 hours. Lipid Profile: Recent Labs    08/21/20 1629  CHOL 187  HDL 37*  LDLCALC 137*  TRIG 64  CHOLHDL 5.1   Thyroid Function Tests: Recent Labs    08/21/20 1343  TSH 4.301   Anemia Panel: Recent Labs    08/21/20 1629  FERRITIN 8*  TIBC 416  IRON 23*   Sepsis Labs: No results for input(s): PROCALCITON, LATICACIDVEN in the  last 168 hours.  Recent Results (from the past 240 hour(s))  Resp Panel by RT-PCR (Flu A&B, Covid) Nasopharyngeal Swab     Status: None   Collection Time: 08/21/20  2:18 PM   Specimen: Nasopharyngeal Swab; Nasopharyngeal(NP) swabs in vial transport medium  Result Value Ref Range Status   SARS Coronavirus 2 by RT PCR NEGATIVE NEGATIVE Final    Comment: (NOTE) SARS-CoV-2 target nucleic acids are NOT DETECTED.  The SARS-CoV-2 RNA is generally detectable in upper respiratory specimens during the acute phase of infection. The lowest concentration of SARS-CoV-2 viral copies this assay can detect is 138 copies/mL. A negative result does not preclude SARS-Cov-2 infection and should not be used as the sole basis for treatment or other patient management decisions. A negative result may occur with  improper specimen collection/handling, submission of specimen other than nasopharyngeal swab, presence of viral mutation(s) within the areas targeted by this assay, and inadequate number of viral copies(<138 copies/mL). A negative result must be combined with clinical observations, patient history, and epidemiological information. The expected result is Negative.  Fact Sheet for Patients:  EntrepreneurPulse.com.au  Fact Sheet for Healthcare Providers:  IncredibleEmployment.be  This test is no t yet approved or cleared by the [place] FDA and  has been authorized for detection and/or diagnosis of SARS-CoV-2 by FDA under an Emergency Use Authorization (EUA). This EUA will remain  in effect (meaning this test can be used) for the duration of the COVID-19 declaration under Section 564(b)(1) of the Act, 21 U.S.C.section 360bbb-3(b)(1), unless the authorization is terminated  or revoked sooner.       Influenza A by PCR NEGATIVE NEGATIVE Final   Influenza B by PCR NEGATIVE NEGATIVE Final    Comment: (NOTE) The Xpert Xpress SARS-CoV-2/FLU/RSV plus assay is  intended as an aid in the diagnosis of influenza from Nasopharyngeal swab specimens and should not be used as a sole basis for treatment. Nasal washings and aspirates are unacceptable for Xpert Xpress SARS-CoV-2/FLU/RSV testing.  Fact Sheet for Patients: EntrepreneurPulse.com.au  Fact Sheet for Healthcare Providers: IncredibleEmployment.be  This test is not yet approved or cleared by the [place] FDA and has been authorized for detection and/or diagnosis of SARS-CoV-2 by FDA under an Emergency Use Authorization (EUA). This EUA will remain in effect (meaning this test can be used) for the duration of the COVID-19 declaration under Section 564(b)(1) of the Act, 21 U.S.C. section 360bbb-3(b)(1), unless the authorization is  terminated or revoked.  Performed at [facility], [place] [address], [place], [place] [address]          Radiology Studies: CT HEAD WO CONTRAST  Result Date: 08/23/2020 CLINICAL DATA:  Head trauma EXAM: CT HEAD WITHOUT CONTRAST CT CERVICAL SPINE WITHOUT CONTRAST TECHNIQUE: Multidetector CT imaging of the head and cervical spine was performed following the standard protocol without intravenous contrast. Multiplanar CT image reconstructions of the cervical spine were also generated. COMPARISON:  None. FINDINGS: CT HEAD FINDINGS Brain: There is no mass, hemorrhage or extra-axial collection. The size and configuration of the ventricles and extra-axial CSF spaces are normal. The brain parenchyma is normal, without evidence of acute or chronic infarction. Vascular: Atherosclerotic calcification of the vertebral and internal carotid arteries at the skull base. No abnormal hyperdensity of the major intracranial arteries or dural venous sinuses. Skull: The visualized skull base, calvarium and extracranial soft tissues are normal. Sinuses/Orbits: No fluid levels or advanced mucosal thickening of the visualized paranasal sinuses. No  mastoid or middle ear effusion. The orbits are normal. CT CERVICAL SPINE FINDINGS Alignment: No static subluxation. Facets are aligned. Occipital condyles are normally positioned. Skull base and vertebrae: No acute fracture. Soft tissues and spinal canal: No prevertebral fluid or swelling. No visible canal hematoma. Disc levels: No advanced spinal canal or neural foraminal stenosis. Upper chest: No pneumothorax, pulmonary nodule or pleural effusion. Other: Incidentally noted Klippel-Feil configuration. IMPRESSION: 1. No acute intracranial abnormality. 2. No acute fracture or static subluxation of the cervical spine. Electronically Signed   By: Ulyses Jarred M.D.   On: 08/23/2020 00:39   CT CERVICAL SPINE WO CONTRAST  Result Date: 08/23/2020 CLINICAL DATA:  Head trauma EXAM: CT HEAD WITHOUT CONTRAST CT CERVICAL SPINE WITHOUT CONTRAST TECHNIQUE: Multidetector CT imaging of the head and cervical spine was performed following the standard protocol without intravenous contrast. Multiplanar CT image reconstructions of the cervical spine were also generated. COMPARISON:  None. FINDINGS: CT HEAD FINDINGS Brain: There is no mass, hemorrhage or extra-axial collection. The size and configuration of the ventricles and extra-axial CSF spaces are normal. The brain parenchyma is normal, without evidence of acute or chronic infarction. Vascular: Atherosclerotic calcification of the vertebral and internal carotid arteries at the skull base. No abnormal hyperdensity of the major intracranial arteries or dural venous sinuses. Skull: The visualized skull base, calvarium and extracranial soft tissues are normal. Sinuses/Orbits: No fluid levels or advanced mucosal thickening of the visualized paranasal sinuses. No mastoid or middle ear effusion. The orbits are normal. CT CERVICAL SPINE FINDINGS Alignment: No static subluxation. Facets are aligned. Occipital condyles are normally positioned. Skull base and vertebrae: No acute  fracture. Soft tissues and spinal canal: No prevertebral fluid or swelling. No visible canal hematoma. Disc levels: No advanced spinal canal or neural foraminal stenosis. Upper chest: No pneumothorax, pulmonary nodule or pleural effusion. Other: Incidentally noted Klippel-Feil configuration. IMPRESSION: 1. No acute intracranial abnormality. 2. No acute fracture or static subluxation of the cervical spine. Electronically Signed   By: Ulyses Jarred M.D.   On: 08/23/2020 00:39   DG Shoulder Left Port  Result Date: 08/23/2020 CLINICAL DATA:  Closed reduction LEFT shoulder EXAM: LEFT SHOULDER COMPARISON:  08/22/2020 FINDINGS: Initial AP image at 1122 hours demonstrates overlap of the LEFT humeral head with the glenoid likely representing persistent anterior dislocation. Second image at 1126 hours, axillary view, demonstrates either persistent anterior dislocation versus less likely marked anterior subluxation. IMPRESSION: Initial image demonstrates persistent anterior glenohumeral dislocation with the  axillary view demonstrating persistent anterior dislocation versus less likely marked anterior subluxation. Findings called to Dr. Erlinda Hong on 08/23/2020 at 1410 hours. Electronically Signed   By: Lavonia Dana M.D.   On: 08/23/2020 14:13   DG Shoulder Left Port  Result Date: 08/22/2020 CLINICAL DATA:  Recent fall with left shoulder pain and deformity, initial encounter EXAM: LEFT SHOULDER COMPARISON:  None. FINDINGS: Anterior inferior dislocation of the humeral head with respect to the glenoid is noted. No acute fracture is seen. The underlying bony thorax and remainder of the shoulder girdle appear within normal limits. IMPRESSION: Anterior inferior dislocation of the left humeral head as described. Electronically Signed   By: Inez Catalina M.D.   On: 08/22/2020 22:36   ECHOCARDIOGRAM COMPLETE  Result Date: 08/22/2020    ECHOCARDIOGRAM REPORT   Patient Name:   HAROLD MONCUS Righter Date of Exam: 08/22/2020 Medical Rec #:   [id number]      Height:       65.0 in Accession #:    [id number]     Weight:       187.2 lb Date of Birth:  09-14-1941      BSA:          1.923 m Patient Age:    15 years       BP:           126/96 mmHg Patient Gender: M              HR:           96 bpm. Exam Location:  Inpatient Procedure: 2D Echo, Cardiac Doppler and Color Doppler Indications:    Atrial fibrillation  History:        Patient has no prior history of Echocardiogram examinations.  Sonographer:    Clayton Lefort RDCS (AE) Referring Phys: [id number] [place]  1. Left ventricular ejection fraction, by estimation, is 25 to 30%. The left ventricle has severely decreased function. The left ventricle demonstrates global hypokinesis. There is moderate left ventricular hypertrophy of the basal-septal segment. Left ventricular diastolic function could not be evaluated.  2. Right ventricular systolic function is normal. The right ventricular size is normal. Tricuspid regurgitation signal is inadequate for assessing PA pressure.  3. Left atrial size was severely dilated.  4. Right atrial size was mildly dilated.  5. The mitral valve is normal in structure. Mild mitral valve regurgitation. No evidence of mitral stenosis.  6. The aortic valve is tricuspid. Aortic valve regurgitation is mild. Mild aortic valve sclerosis is present, with no evidence of aortic valve stenosis.  7. Aortic dilatation noted. There is mild dilatation of the ascending aorta, measuring 40 mm.  8. The inferior vena cava is normal in size with greater than 50% respiratory variability, suggesting right atrial pressure of 3 mmHg. FINDINGS  Left Ventricle: Left ventricular ejection fraction, by estimation, is 25 to 30%. The left ventricle has severely decreased function. The left ventricle demonstrates global hypokinesis. The left ventricular internal cavity size was normal in size. There is moderate left ventricular hypertrophy of the basal-septal segment. Left ventricular diastolic  function could not be evaluated due to atrial fibrillation. Left ventricular diastolic function could not be evaluated. Right Ventricle: The right ventricular size is normal. Right ventricular systolic function is normal. Tricuspid regurgitation signal is inadequate for assessing PA pressure. The tricuspid regurgitant velocity is 2.07 m/s, and with an assumed right atrial  pressure of 15 mmHg, the estimated right ventricular systolic pressure is 33.8 mmHg. Left  Atrium: Left atrial size was severely dilated. Right Atrium: Right atrial size was mildly dilated. Pericardium: There is no evidence of pericardial effusion. Mitral Valve: The mitral valve is normal in structure. Mild mitral valve regurgitation. No evidence of mitral valve stenosis. Tricuspid Valve: The tricuspid valve is normal in structure. Tricuspid valve regurgitation is mild . No evidence of tricuspid stenosis. Aortic Valve: The aortic valve is tricuspid. Aortic valve regurgitation is mild. Aortic regurgitation PHT measures 953 msec. Mild aortic valve sclerosis is present, with no evidence of aortic valve stenosis. Pulmonic Valve: The pulmonic valve was not well visualized. Pulmonic valve regurgitation is not visualized. No evidence of pulmonic stenosis. Aorta: Aortic dilatation noted. There is mild dilatation of the ascending aorta, measuring 40 mm. Venous: The inferior vena cava is normal in size with greater than 50% respiratory variability, suggesting right atrial pressure of 3 mmHg. IAS/Shunts: No atrial level shunt detected by color flow Doppler.  LEFT VENTRICLE PLAX 2D LVIDd:         4.40 cm LVIDs:         3.60 cm LV PW:         1.80 cm LV IVS:        1.70 cm LVOT diam:     2.10 cm LV SV:         55 LV SV Index:   28 LVOT Area:     3.46 cm  LV Volumes (MOD) LV vol d, MOD A2C: 68.8 ml LV vol d, MOD A4C: 104.0 ml LV vol s, MOD A2C: 51.5 ml LV vol s, MOD A4C: 76.3 ml LV SV MOD A2C:     17.3 ml LV SV MOD A4C:     104.0 ml LV SV MOD BP:      21.5 ml  RIGHT VENTRICLE             IVC RV Basal diam:  3.60 cm     IVC diam: 2.30 cm RV Mid diam:    1.90 cm RV S prime:     11.00 cm/s TAPSE (M-mode): 1.6 cm LEFT ATRIUM              Index       RIGHT ATRIUM           Index LA diam:        4.10 cm  2.13 cm/m  RA Area:     23.30 cm LA Vol (A2C):   155.0 ml 80.60 ml/m RA Volume:   65.80 ml  34.21 ml/m LA Vol (A4C):   128.0 ml 66.56 ml/m LA Biplane Vol: 142.0 ml 73.84 ml/m  AORTIC VALVE LVOT Vmax:   91.78 cm/s LVOT Vmean:  59.840 cm/s LVOT VTI:    0.157 m AI PHT:      953 msec  AORTA Ao Root diam: 3.30 cm Ao Asc diam:  4.00 cm TRICUSPID VALVE TR Peak grad:   17.1 mmHg TR Vmax:        207.00 cm/s  SHUNTS Systemic VTI:  0.16 m Systemic Diam: 2.10 cm Kirk Ruths MD Electronically signed by Kirk Ruths MD Signature Date/Time: 08/22/2020/6:31:36 PM    Final         Scheduled Meds: . aspirin EC  81 mg Oral Daily  . chlorhexidine  15 mL Mouth/Throat Once  . famotidine  40 mg Oral QHS  . fluticasone furoate-vilanterol  1 puff Inhalation Daily  . furosemide  40 mg Intravenous Once  . furosemide  40 mg Intravenous Daily  .  metoprolol tartrate  75 mg Oral BID  . montelukast  10 mg Oral QHS  . pantoprazole  40 mg Oral Daily  . sodium chloride flush  3 mL Intravenous Q12H   Continuous Infusions: . sodium chloride    . amiodarone 30 mg/hr (08/24/20 0054)  . heparin 1,700 Units/hr (08/24/20 0509)  . lactated ringers 10 mL/hr at 08/23/20 1113    Assessment & Plan:   Active Problems:   Atrial fibrillation with rapid ventricular response (HCC)   Traumatic closed displaced fracture of left shoulder with anterior dislocation   New onset A. fib with RVR -Heart rate still not well controlled.   Cardiology following-stoped cardizem due to LV dysfunction Continue on amidarone gtt cotninue beta blk and heparin gtt Once cleared start Elqiuis 5mg  bid as CHADS2VASC score 5  if can get rate control will need 24 weeks on Eliquis with no missed doses prior  to DCCV If cannot get rate controlled then will need TEE/DCCV prior to discharge cardizem gtt d/c'd...>started on amio gtt. Needs GI w/u, EGD for anemia inorder to start long term a/c with Eliquis EGD on hold due to pt falling and dislocating shoulder, s/p repair- spoke to Dr. Michail Sermon, will need to know when from orthopedic stand point pt can turn to his left side for the procedure. I have contacted orthopedic , awaiting response.     Cardiomyopathy-LVEF 25-30% found on echo on 08/22/2020 Possibly due to afib rvr v.s. primary causing afib rvr.  On beta blk -12/4-lasix iv 40mg  daily, reassess need daily. Cardiology following Will need to add Entresto and eventually Aldactone, but will hold on this for now as we need to titrate beta-blockers for heart rate control  Will need outpatient ischemic work-up  I's and O's  Daily weight     S/p fall with left shoulder dislocation- last night, found with Left shoulder dislocation Status post closed reduction by Dr. Erlinda Hong on 12/3 Continue arm sling   Hypokalemia Supplemented and stable Continue to monitor   TIAs -recent -MRI -2 weeks ago. Likely 2/2 afib LDL goal <70  Chronic normocytic-microcytic anemia Fe 23,TIBC 416, Ferritin 8 Will contact GI since last scope was 8 yrs ago per pt. Also as we need to start Eliquis but need GI clearance. 12/3-GI w/u on hold as pt went to OR today as above GI recommended to follow-up with primary GI in a month or 2 and decide about repeat work-up at that time 12/4-since we need to know long term a/c and possibly pt may end up having need for TEE CV , I have asked GI to see if can do work-up here. will need to know when from orthopedic stand point pt can turn to his left side for the procedure. Start po Iron.  Spoke to pt about IV iron but after much discussion, he would like to try po first . May need to readdress this again .  Continue ppi   Asthma, mild intermittent No exacerbation. Continue  inhalers  DVT prophylaxis: heparin Code Status: Full Family Communication: None at bedside  Status is: Inpatient  Remains inpatient appropriate because:IV treatments appropriate due to intensity of illness or inability to take PO   Dispo: The patient is from: Home              Anticipated d/c is to: Home              Anticipated d/c date is: 2 days  Patient currently is not medically stable to d/c.            LOS: 3 days   Time spent: 35 minutes with more than 50% on [place], MD [facility] Pager [phone]  If 7PM-7AM, please contact night-coverage [email or web address] Password [facility] 08/24/2020, 7:49 AM

## 2020-08-24 NOTE — Progress Notes (Addendum)
[place] for IV Heparin Indication: atrial fibrillation and recent TIAs  No Known Allergies  Patient Measurements: Weight 89.8 kg (from 08/19/20) Hight 5'5 (from 08/19/20) Heparin Dosing Weight: 81 kg  Vital Signs: Temp: 98.1 F (36.7 C) (12/04 1229) Temp Source: Oral (12/04 1229) BP: 115/99 (12/04 1229) Pulse Rate: 117 (12/04 1229)  Labs: Recent Labs    08/22/20 0104 08/22/20 1135 08/23/20 0228 08/24/20 0327 08/24/20 1257  HGB 11.5*  --  10.8* 10.7*  --   HCT 36.3*  --  34.6* 34.6*  --   PLT 168  --  158 140*  --   HEPARINUNFRC <0.10*   < > 0.14* 0.20* 0.12*  CREATININE 1.09  --  1.13 0.98  --    < > = values in this interval not displayed.    Estimated Creatinine Clearance: 61.7 mL/min (by C-G formula based on SCr of 0.98 mg/dL).   Medical History: Past Medical History:  Diagnosis Date  . Arthritis   . Asthma    last asthma attack at age 12  . GERD (gastroesophageal reflux disease)   . Headache   . Other specified iron deficiency anemias   . PAC (premature atrial contraction)    occasional PAC's    Medications:  Medications Prior to Admission  Medication Sig Dispense Refill Last Dose  . ADVAIR DISKUS 250-50 MCG/DOSE AEPB INHALE 1 PUFF BY MOUTH ONCE TO TWICE A DAY TO PREVENT COUGH OR WHEEZE. RINSE, GARGLE, AND SPIT AFTER USE. (Patient taking differently: Inhale 1 puff into the lungs See admin instructions. Inhale 1 puff into the lungs at bedtime and an additional 1 puff in the morning as needed for coughing, wheezing, or shortness of breath) 180 each 0 08/20/2020 at Unknown time  . albuterol (PROVENTIL HFA) 108 (90 Base) MCG/ACT inhaler Inhale two puffs every four to six hours as needed for cough or wheeze. (Patient taking differently: Inhale 2 puffs into the lungs See admin instructions. Inhale 2 puffs into the lungs every four to six hours as needed for coughing or wheezing) 1 Inhaler 1 unk  . amLODipine (NORVASC) 5 MG  tablet Take 5 mg by mouth at bedtime.   11 08/20/2020 at pm  . esomeprazole (NEXIUM) 40 MG capsule Take 40 mg by mouth See admin instructions. Take 40 mg by mouth in the morning before breakfast and an additional 40 mg once a day as needed for recurring heartburn   08/21/2020 at am  . famotidine (PEPCID) 40 MG tablet Take 40 mg by mouth at bedtime.    08/19/2020 at pm  . montelukast (SINGULAIR) 10 MG tablet TAKE 1 TABLET (10 MG TOTAL) BY MOUTH AT BEDTIME. 90 tablet 0 08/20/2020 at pm  . triamcinolone (NASACORT ALLERGY 24HR) 55 MCG/ACT AERO nasal inhaler Place 1 spray into the nose daily as needed (for allergies or rhinitis).    unk  . omeprazole (PRILOSEC) 40 MG capsule Take 1 capsule (40 mg total) by mouth in the morning and at bedtime. (Patient not taking: Reported on 08/21/2020) 60 capsule 5 Not Taking at Unknown time    Assessment: 79 years of age male with history of several TIAs in the last month and admitted with atrial fibrillation to start IV Heparin per pharmacy dosing. Hgb/Hct are stable from previous labs. Platelets are stable at 152. No bleeding reported. Patient was not on an anticoagulant prior to admission.   Heparin level this afternoon below goal, however RN noted it was off for about  an hour shortly before heparin level drawn (had been restarted ~ 1 hr).  No overt bleeding or complications noted.  CBC stable.  Goal of Therapy:  Heparin level 0.3 to 0.5 units/ml  - lower goal due to multiple recent TIAs Monitor platelets by anticoagulation protocol: Yes   Plan:  Continue IV Heparin at 1700 units/hr for now. At 10 PM tonight, will stop heparin and give Eliquis 5 mg BID.  [place] Crane, Roylene Reason, BCCP Clinical Pharmacist  08/24/2020 3:31 PM   [facility] pharmacy phone numbers are listed on [email or web address]

## 2020-08-24 NOTE — Progress Notes (Signed)
Progress Note  Patient Name: Tony Roberts Date of Encounter: 08/24/2020  [facility] Cardiologist: NEW   Patient Profile     79 y.o. male with a hx of HTN and GERD but no prior cardiac disease.  Recently ? TIAs but  08/02/2020 with normal MRI of the brain.  Increasing cough and DOE >>  Palpitations  12/1 ER afib with RVR.   Hx of anemia--FeDef Endoscopy deferred 2/2 shoulder injury (See Below)  Course cx by falling out of bed--shoulder dislocation  12/2 Echo EF 25-30%      Subjective   Remains in afib with RVR despite IV Cardizem and Lopressor.  Denies any chest pain or SOB.  Was on scheduled for endoscopy this am to determine source of low iron panel but this is now on hold as he fell out of bed and dislocated his shoulder  Inpatient Medications    Scheduled Meds: . aspirin EC  81 mg Oral Daily  . famotidine  40 mg Oral QHS  . [START ON 08/25/2020] ferrous sulfate  325 mg Oral Q breakfast  . fluticasone furoate-vilanterol  1 puff Inhalation Daily  . furosemide  40 mg Intravenous Once  . furosemide  40 mg Intravenous Daily  . metoprolol tartrate  75 mg Oral BID  . montelukast  10 mg Oral QHS  . pantoprazole  40 mg Oral Daily  . sodium chloride flush  3 mL Intravenous Q12H   Continuous Infusions: . sodium chloride    . amiodarone 30 mg/hr (08/24/20 0054)  . heparin    . lactated ringers 10 mL/hr at 08/23/20 1113   PRN Meds: sodium chloride, acetaminophen, levalbuterol, morphine injection, ondansetron (ZOFRAN) IV, sodium chloride flush, traMADol, triamcinolone   Vital Signs    Vitals:   08/24/20 0340 08/24/20 0354 08/24/20 0745 08/24/20 0902  BP: (!) 118/99  (!) 123/95   Pulse: (!) 121  (!) 120   Resp: 16  20   Temp: 98.6 F (37 C)  97.9 F (36.6 C)   TempSrc: Oral  Oral   SpO2: 96%  92% 95%  Weight:  86.3 kg    Height:        Intake/Output Summary (Last 24 hours) at 08/24/2020 1155 Last data filed at 08/24/2020 1115 Gross per 24 hour  Intake 389.1 ml   Output 775 ml  Net -385.9 ml   Last 3 Weights 08/24/2020 08/23/2020 08/22/2020  Weight (lbs) 190 lb 4.1 oz 193 lb 2 oz 187 lb 2.7 oz  Weight (kg) 86.3 kg 87.6 kg 84.9 kg      Telemetry    Atrial fibrillation 110s - Personally Reviewed  ECG    No new EKG to review - Personally Reviewed  Physical Exam   Well developed and nourished in no acute distress HENT normal Neck supple   Clear Fast and irregulr rate and rhythm, no murmurs or gallops Abd-soft with active BS No Clubbing cyanosis edema Skin-warm and dry A & Oriented  Grossly normal sensory and motor function      Labs    High Sensitivity Troponin:   Recent Labs  Lab 08/21/20 1105 08/21/20 1343  TROPONINIHS 10 10      Chemistry Recent Labs  Lab 08/21/20 1105 08/21/20 1105 08/22/20 0104 08/23/20 0228 08/24/20 0327  NA 139   < > 141 136 137  K 3.4*   < > 3.8 3.7 3.6  CL 105   < > 105 104 103  CO2 21*   < >  24 21* 20*  GLUCOSE 135*   < > 121* 152* 117*  BUN 10   < > 9 19 18   CREATININE 1.10   < > 1.09 1.13 0.98  CALCIUM 8.8*   < > 9.0 8.9 8.8*  PROT 6.3*  --   --   --   --   ALBUMIN 3.5  --   --   --   --   AST 26  --   --   --   --   ALT 20  --   --   --   --   ALKPHOS 64  --   --   --   --   BILITOT 1.4*  --   --   --   --   GFRNONAA >60   < > >60 >60 >60  ANIONGAP 13   < > 12 11 14    < > = values in this interval not displayed.     Hematology Recent Labs  Lab 08/22/20 0104 08/23/20 0228 08/24/20 0327  WBC 6.6 11.4* 8.4  RBC 4.65 4.41 4.39  HGB 11.5* 10.8* 10.7*  HCT 36.3* 34.6* 34.6*  MCV 78.1* 78.5* 78.8*  MCH 24.7* 24.5* 24.4*  MCHC 31.7 31.2 30.9  RDW 16.3* 16.1* 16.1*  PLT 168 158 140*    BNP Recent Labs  Lab 08/21/20 1127  BNP 328.4*     DDimer No results for input(s): DDIMER in the last 168 hours.    CHA2DS2-VASc Score = 5  This indicates a 7.2% annual risk of stroke. The patient's score is based upon:   Radiology    CT HEAD WO CONTRAST  Result Date:  08/23/2020 CLINICAL DATA:  Head trauma EXAM: CT HEAD WITHOUT CONTRAST CT CERVICAL SPINE WITHOUT CONTRAST TECHNIQUE: Multidetector CT imaging of the head and cervical spine was performed following the standard protocol without intravenous contrast. Multiplanar CT image reconstructions of the cervical spine were also generated. COMPARISON:  None. FINDINGS: CT HEAD FINDINGS Brain: There is no mass, hemorrhage or extra-axial collection. The size and configuration of the ventricles and extra-axial CSF spaces are normal. The brain parenchyma is normal, without evidence of acute or chronic infarction. Vascular: Atherosclerotic calcification of the vertebral and internal carotid arteries at the skull base. No abnormal hyperdensity of the major intracranial arteries or dural venous sinuses. Skull: The visualized skull base, calvarium and extracranial soft tissues are normal. Sinuses/Orbits: No fluid levels or advanced mucosal thickening of the visualized paranasal sinuses. No mastoid or middle ear effusion. The orbits are normal. CT CERVICAL SPINE FINDINGS Alignment: No static subluxation. Facets are aligned. Occipital condyles are normally positioned. Skull base and vertebrae: No acute fracture. Soft tissues and spinal canal: No prevertebral fluid or swelling. No visible canal hematoma. Disc levels: No advanced spinal canal or neural foraminal stenosis. Upper chest: No pneumothorax, pulmonary nodule or pleural effusion. Other: Incidentally noted Klippel-Feil configuration. IMPRESSION: 1. No acute intracranial abnormality. 2. No acute fracture or static subluxation of the cervical spine. Electronically Signed   By: Ulyses Jarred M.D.   On: 08/23/2020 00:39   CT CERVICAL SPINE WO CONTRAST  Result Date: 08/23/2020 CLINICAL DATA:  Head trauma EXAM: CT HEAD WITHOUT CONTRAST CT CERVICAL SPINE WITHOUT CONTRAST TECHNIQUE: Multidetector CT imaging of the head and cervical spine was performed following the standard protocol  without intravenous contrast. Multiplanar CT image reconstructions of the cervical spine were also generated. COMPARISON:  None. FINDINGS: CT HEAD FINDINGS Brain: There is no mass, hemorrhage or extra-axial  collection. The size and configuration of the ventricles and extra-axial CSF spaces are normal. The brain parenchyma is normal, without evidence of acute or chronic infarction. Vascular: Atherosclerotic calcification of the vertebral and internal carotid arteries at the skull base. No abnormal hyperdensity of the major intracranial arteries or dural venous sinuses. Skull: The visualized skull base, calvarium and extracranial soft tissues are normal. Sinuses/Orbits: No fluid levels or advanced mucosal thickening of the visualized paranasal sinuses. No mastoid or middle ear effusion. The orbits are normal. CT CERVICAL SPINE FINDINGS Alignment: No static subluxation. Facets are aligned. Occipital condyles are normally positioned. Skull base and vertebrae: No acute fracture. Soft tissues and spinal canal: No prevertebral fluid or swelling. No visible canal hematoma. Disc levels: No advanced spinal canal or neural foraminal stenosis. Upper chest: No pneumothorax, pulmonary nodule or pleural effusion. Other: Incidentally noted Klippel-Feil configuration. IMPRESSION: 1. No acute intracranial abnormality. 2. No acute fracture or static subluxation of the cervical spine. Electronically Signed   By: Ulyses Jarred M.D.   On: 08/23/2020 00:39   DG Shoulder Left Port  Result Date: 08/23/2020 CLINICAL DATA:  Closed reduction LEFT shoulder EXAM: LEFT SHOULDER COMPARISON:  08/22/2020 FINDINGS: Initial AP image at 1122 hours demonstrates overlap of the LEFT humeral head with the glenoid likely representing persistent anterior dislocation. Second image at 1126 hours, axillary view, demonstrates either persistent anterior dislocation versus less likely marked anterior subluxation. IMPRESSION: Initial image demonstrates  persistent anterior glenohumeral dislocation with the axillary view demonstrating persistent anterior dislocation versus less likely marked anterior subluxation. Findings called to Dr. Erlinda Hong on 08/23/2020 at 1410 hours. Electronically Signed   By: Lavonia Dana M.D.   On: 08/23/2020 14:13   DG Shoulder Left Port  Result Date: 08/22/2020 CLINICAL DATA:  Recent fall with left shoulder pain and deformity, initial encounter EXAM: LEFT SHOULDER COMPARISON:  None. FINDINGS: Anterior inferior dislocation of the humeral head with respect to the glenoid is noted. No acute fracture is seen. The underlying bony thorax and remainder of the shoulder girdle appear within normal limits. IMPRESSION: Anterior inferior dislocation of the left humeral head as described. Electronically Signed   By: Inez Catalina M.D.   On: 08/22/2020 22:36   ECHOCARDIOGRAM COMPLETE  Result Date: 08/22/2020    ECHOCARDIOGRAM REPORT   Patient Name:   MARIAN GRANDT Boudreau Date of Exam: 08/22/2020 Medical Rec #:  [id number]      Height:       65.0 in Accession #:    [id number]     Weight:       187.2 lb Date of Birth:  1941-06-07      BSA:          1.923 m Patient Age:    77 years       BP:           126/96 mmHg Patient Gender: M              HR:           96 bpm. Exam Location:  Inpatient Procedure: 2D Echo, Cardiac Doppler and Color Doppler Indications:    Atrial fibrillation  History:        Patient has no prior history of Echocardiogram examinations.  Sonographer:    Clayton Lefort RDCS (AE) Referring Phys: [id number] [place]  1. Left ventricular ejection fraction, by estimation, is 25 to 30%. The left ventricle has severely decreased function. The left ventricle demonstrates global hypokinesis. There is moderate left ventricular hypertrophy  of the basal-septal segment. Left ventricular diastolic function could not be evaluated.  2. Right ventricular systolic function is normal. The right ventricular size is normal. Tricuspid regurgitation signal  is inadequate for assessing PA pressure.  3. Left atrial size was severely dilated.  4. Right atrial size was mildly dilated.  5. The mitral valve is normal in structure. Mild mitral valve regurgitation. No evidence of mitral stenosis.  6. The aortic valve is tricuspid. Aortic valve regurgitation is mild. Mild aortic valve sclerosis is present, with no evidence of aortic valve stenosis.  7. Aortic dilatation noted. There is mild dilatation of the ascending aorta, measuring 40 mm.  8. The inferior vena cava is normal in size with greater than 50% respiratory variability, suggesting right atrial pressure of 3 mmHg. FINDINGS  Left Ventricle: Left ventricular ejection fraction, by estimation, is 25 to 30%. The left ventricle has severely decreased function. The left ventricle demonstrates global hypokinesis. The left ventricular internal cavity size was normal in size. There is moderate left ventricular hypertrophy of the basal-septal segment. Left ventricular diastolic function could not be evaluated due to atrial fibrillation. Left ventricular diastolic function could not be evaluated. Right Ventricle: The right ventricular size is normal. Right ventricular systolic function is normal. Tricuspid regurgitation signal is inadequate for assessing PA pressure. The tricuspid regurgitant velocity is 2.07 m/s, and with an assumed right atrial  pressure of 15 mmHg, the estimated right ventricular systolic pressure is 42.7 mmHg. Left Atrium: Left atrial size was severely dilated. Right Atrium: Right atrial size was mildly dilated. Pericardium: There is no evidence of pericardial effusion. Mitral Valve: The mitral valve is normal in structure. Mild mitral valve regurgitation. No evidence of mitral valve stenosis. Tricuspid Valve: The tricuspid valve is normal in structure. Tricuspid valve regurgitation is mild . No evidence of tricuspid stenosis. Aortic Valve: The aortic valve is tricuspid. Aortic valve regurgitation is mild.  Aortic regurgitation PHT measures 953 msec. Mild aortic valve sclerosis is present, with no evidence of aortic valve stenosis. Pulmonic Valve: The pulmonic valve was not well visualized. Pulmonic valve regurgitation is not visualized. No evidence of pulmonic stenosis. Aorta: Aortic dilatation noted. There is mild dilatation of the ascending aorta, measuring 40 mm. Venous: The inferior vena cava is normal in size with greater than 50% respiratory variability, suggesting right atrial pressure of 3 mmHg. IAS/Shunts: No atrial level shunt detected by color flow Doppler.  LEFT VENTRICLE PLAX 2D LVIDd:         4.40 cm LVIDs:         3.60 cm LV PW:         1.80 cm LV IVS:        1.70 cm LVOT diam:     2.10 cm LV SV:         55 LV SV Index:   28 LVOT Area:     3.46 cm  LV Volumes (MOD) LV vol d, MOD A2C: 68.8 ml LV vol d, MOD A4C: 104.0 ml LV vol s, MOD A2C: 51.5 ml LV vol s, MOD A4C: 76.3 ml LV SV MOD A2C:     17.3 ml LV SV MOD A4C:     104.0 ml LV SV MOD BP:      21.5 ml RIGHT VENTRICLE             IVC RV Basal diam:  3.60 cm     IVC diam: 2.30 cm RV Mid diam:    1.90 cm RV S prime:  11.00 cm/s TAPSE (M-mode): 1.6 cm LEFT ATRIUM              Index       RIGHT ATRIUM           Index LA diam:        4.10 cm  2.13 cm/m  RA Area:     23.30 cm LA Vol (A2C):   155.0 ml 80.60 ml/m RA Volume:   65.80 ml  34.21 ml/m LA Vol (A4C):   128.0 ml 66.56 ml/m LA Biplane Vol: 142.0 ml 73.84 ml/m  AORTIC VALVE LVOT Vmax:   91.78 cm/s LVOT Vmean:  59.840 cm/s LVOT VTI:    0.157 m AI PHT:      953 msec  AORTA Ao Root diam: 3.30 cm Ao Asc diam:  4.00 cm TRICUSPID VALVE TR Peak grad:   17.1 mmHg TR Vmax:        207.00 cm/s  SHUNTS Systemic VTI:  0.16 m Systemic Diam: 2.10 cm Kirk Ruths MD Electronically signed by Kirk Ruths MD Signature Date/Time: 08/22/2020/6:31:36 PM    Final     Cardiac Studies   2D echo 08/22/2020 IMPRESSIONS    1. Left ventricular ejection fraction, by estimation, is 25 to 30%. The  left  ventricle has severely decreased function. The left ventricle  demonstrates global hypokinesis. There is moderate left ventricular  hypertrophy of the basal-septal segment. Left  ventricular diastolic function could not be evaluated.  2. Right ventricular systolic function is normal. The right ventricular  size is normal. Tricuspid regurgitation signal is inadequate for assessing  PA pressure.  3. Left atrial size was severely dilated.  4. Right atrial size was mildly dilated.  5. The mitral valve is normal in structure. Mild mitral valve  regurgitation. No evidence of mitral stenosis.  6. The aortic valve is tricuspid. Aortic valve regurgitation is mild.  Mild aortic valve sclerosis is present, with no evidence of aortic valve  stenosis.  7. Aortic dilatation noted. There is mild dilatation of the ascending  aorta, measuring 40 mm.  8. The inferior vena cava is normal in size with greater than 50%  respiratory variability, suggesting right atrial pressure of 3 mmHg.    Assessment & Plan    1.  Atrial fibrillation with RVR  GI bleeding  TIA MRI negative  Congestive heart failure acute/chronic/systolic   Patient continues to struggle with symptoms of rapid atrial fibrillation.  We will proceed with anticoagulation i.e. transitioning from heparin to Eliquis.  We will plan on Tuesday to do TEE guided cardioversion hopefully the amiodarone which he is currently taking will suffice to help keep him in sinus rhythm.  GI evaluation will be deferred.  This was postponed because of the left shoulder issue.  We will have to ask the echo team whether he can have a TEE with his left shoulder limitations.  Stop[ ASA no clear data for adjunctive benefit with NOAC        Signed, Virl Axe, MD  08/24/2020, 11:55 AM

## 2020-08-24 NOTE — Plan of Care (Signed)

## 2020-08-24 NOTE — Progress Notes (Signed)
ANTICOAGULATION CONSULT NOTE   Pharmacy Consult for IV Heparin Indication: atrial fibrillation and recent TIAs  No Known Allergies  Patient Measurements: Weight 89.8 kg (from 08/19/20) Hight 5'5 (from 08/19/20) Heparin Dosing Weight: 81 kg  Vital Signs: Temp: 98.6 F (37 C) (12/04 0340) Temp Source: Oral (12/04 0340) BP: 118/99 (12/04 0340) Pulse Rate: 121 (12/04 0340)  Labs: Recent Labs    08/21/20 1105 08/21/20 1105 08/21/20 1343 08/22/20 0104 08/22/20 0104 08/22/20 1135 08/23/20 0228 08/24/20 0327  HGB 11.6*   < >  --  11.5*   < >  --  10.8* 10.7*  HCT 38.1*   < >  --  36.3*  --   --  34.6* 34.6*  PLT 152   < >  --  168  --   --  158 140*  HEPARINUNFRC  --   --   --  <0.10*   < > 0.12* 0.14* 0.20*  CREATININE 1.10   < >  --  1.09  --   --  1.13 0.98  TROPONINIHS 10  --  10  --   --   --   --   --    < > = values in this interval not displayed.    Estimated Creatinine Clearance: 61.7 mL/min (by C-G formula based on SCr of 0.98 mg/dL).   Medical History: Past Medical History:  Diagnosis Date  . Arthritis   . Asthma    last asthma attack at age 69  . GERD (gastroesophageal reflux disease)   . Headache   . Other specified iron deficiency anemias   . PAC (premature atrial contraction)    occasional PAC's    Medications:  Medications Prior to Admission  Medication Sig Dispense Refill Last Dose  . ADVAIR DISKUS 250-50 MCG/DOSE AEPB INHALE 1 PUFF BY MOUTH ONCE TO TWICE A DAY TO PREVENT COUGH OR WHEEZE. RINSE, GARGLE, AND SPIT AFTER USE. (Patient taking differently: Inhale 1 puff into the lungs See admin instructions. Inhale 1 puff into the lungs at bedtime and an additional 1 puff in the morning as needed for coughing, wheezing, or shortness of breath) 180 each 0 08/20/2020 at Unknown time  . albuterol (PROVENTIL HFA) 108 (90 Base) MCG/ACT inhaler Inhale two puffs every four to six hours as needed for cough or wheeze. (Patient taking differently: Inhale 2 puffs  into the lungs See admin instructions. Inhale 2 puffs into the lungs every four to six hours as needed for coughing or wheezing) 1 Inhaler 1 unk  . amLODipine (NORVASC) 5 MG tablet Take 5 mg by mouth at bedtime.   11 08/20/2020 at pm  . esomeprazole (NEXIUM) 40 MG capsule Take 40 mg by mouth See admin instructions. Take 40 mg by mouth in the morning before breakfast and an additional 40 mg once a day as needed for recurring heartburn   08/21/2020 at am  . famotidine (PEPCID) 40 MG tablet Take 40 mg by mouth at bedtime.    08/19/2020 at pm  . montelukast (SINGULAIR) 10 MG tablet TAKE 1 TABLET (10 MG TOTAL) BY MOUTH AT BEDTIME. 90 tablet 0 08/20/2020 at pm  . triamcinolone (NASACORT ALLERGY 24HR) 55 MCG/ACT AERO nasal inhaler Place 1 spray into the nose daily as needed (for allergies or rhinitis).    unk  . omeprazole (PRILOSEC) 40 MG capsule Take 1 capsule (40 mg total) by mouth in the morning and at bedtime. (Patient not taking: Reported on 08/21/2020) 60 capsule 5 Not Taking at Unknown  time    Assessment: 79 years of age male with history of several TIAs in the last month and admitted with atrial fibrillation to start IV Heparin per pharmacy dosing. Hgb/Hct are stable from previous labs. Platelets are stable at 152. No bleeding reported. Patient was not on an anticoagulant prior to admission.   Patient unable to go for endoscopy given fall and inability to lay flat- heparin was stopped at 0300. Previous to stopping heparin level came back subtherapeutic at 0.14, on 1400 units/hr. Hgb 10.8, plt 158. No s/sx of bleeding.   Orders received from Dr. Kurtis Bushman who clarified with surgery to resume IV Heparin.  12/4 AM update:  Heparin level below goal after re-start No issues per RN   Goal of Therapy:  Heparin level 0.3 to 0.5 units/ml  - lower goal due to multiple recent TIAs Monitor platelets by anticoagulation protocol: Yes   Plan:  Inc heparin to 1700 units/hr 1300 heparin level Daily heparin  level and CBC while on therapy  Narda Bonds, PharmD, BCPS Clinical Pharmacist Phone: [phone]

## 2020-08-25 DIAGNOSIS — I517 Cardiomegaly: Secondary | ICD-10-CM | POA: Diagnosis not present

## 2020-08-25 DIAGNOSIS — W19XXXS Unspecified fall, sequela: Secondary | ICD-10-CM

## 2020-08-25 DIAGNOSIS — D508 Other iron deficiency anemias: Secondary | ICD-10-CM | POA: Diagnosis not present

## 2020-08-25 DIAGNOSIS — I4891 Unspecified atrial fibrillation: Secondary | ICD-10-CM | POA: Diagnosis not present

## 2020-08-25 DIAGNOSIS — I509 Heart failure, unspecified: Secondary | ICD-10-CM | POA: Diagnosis not present

## 2020-08-25 LAB — CBC
HCT: 35.5 % — ABNORMAL LOW (ref 39.0–52.0)
Hemoglobin: 11.1 g/dL — ABNORMAL LOW (ref 13.0–17.0)
MCH: 24.6 pg — ABNORMAL LOW (ref 26.0–34.0)
MCHC: 31.3 g/dL (ref 30.0–36.0)
MCV: 78.7 fL — ABNORMAL LOW (ref 80.0–100.0)
Platelets: 155 10*3/uL (ref 150–400)
RBC: 4.51 MIL/uL (ref 4.22–5.81)
RDW: 16.3 % — ABNORMAL HIGH (ref 11.5–15.5)
WBC: 9 10*3/uL (ref 4.0–10.5)
nRBC: 0 % (ref 0.0–0.2)

## 2020-08-25 LAB — BASIC METABOLIC PANEL
Anion gap: 9 (ref 5–15)
BUN: 21 mg/dL (ref 8–23)
CO2: 24 mmol/L (ref 22–32)
Calcium: 8.4 mg/dL — ABNORMAL LOW (ref 8.9–10.3)
Chloride: 102 mmol/L (ref 98–111)
Creatinine, Ser: 1.17 mg/dL (ref 0.61–1.24)
GFR, Estimated: >60 mL/min (ref >60)
Glucose, Bld: 103 mg/dL — ABNORMAL HIGH (ref 70–99)
Potassium: 3.6 mmol/L (ref 3.5–5.1)
Sodium: 135 mmol/L (ref 135–145)

## 2020-08-25 NOTE — Discharge Instructions (Signed)

## 2020-08-25 NOTE — Progress Notes (Signed)
MRI of left shoulder shows that the shoulder is actually reduced.  Will attempt nonop management with shoulder immobilizer at all times.  May undergo endoscopy as long as patient does not lie on his left side.  Follow up in my office in 1 week.

## 2020-08-25 NOTE — Progress Notes (Signed)
Progress Note  Patient Name: Tony Roberts Date of Encounter: 08/25/2020  [facility] Cardiologist: NEW   Patient Profile     79 y.o. male with a hx of HTN and GERD but no prior cardiac disease.  Recently ? TIAs but  08/02/2020 with normal MRI of the brain.  Increasing cough and DOE >>  Palpitations  12/1 ER afib with RVR.   Hx of anemia--FeDef Endoscopy deferred 2/2 shoulder injury (See Below)  Course cx by falling out of bed--shoulder dislocation  12/2 Echo EF 25-30%      Subjective   wtihout chest pain some palps but no dyspnea   Inpatient Medications    Scheduled Meds: . apixaban  5 mg Oral BID  . famotidine  40 mg Oral QHS  . ferrous sulfate  325 mg Oral BID WC  . fluticasone furoate-vilanterol  1 puff Inhalation Daily  . furosemide  40 mg Intravenous Once  . furosemide  40 mg Intravenous Daily  . metoprolol tartrate  75 mg Oral BID  . montelukast  10 mg Oral QHS  . pantoprazole  40 mg Oral Daily  . sodium chloride flush  3 mL Intravenous Q12H   Continuous Infusions: . sodium chloride    . amiodarone 30 mg/hr (08/25/20 0400)  . lactated ringers 10 mL/hr at 08/23/20 1113   PRN Meds: sodium chloride, acetaminophen, levalbuterol, morphine injection, ondansetron (ZOFRAN) IV, sodium chloride flush, traMADol, triamcinolone   Vital Signs    Vitals:   08/25/20 0500 08/25/20 0848 08/25/20 0850 08/25/20 1043  BP:  (!) 115/93  (!) 121/91  Pulse:  (!) 109 (!) 109 (!) 119  Resp:  16  17  Temp:  98 F (36.7 C)  97.7 F (36.5 C)  TempSrc:  Oral  Oral  SpO2:  95%  95%  Weight: 84.9 kg     Height:        Intake/Output Summary (Last 24 hours) at 08/25/2020 1046 Last data filed at 08/25/2020 0559 Gross per 24 hour  Intake 653.82 ml  Output 1000 ml  Net -346.18 ml   Last 3 Weights 08/25/2020 08/24/2020 08/23/2020  Weight (lbs) 187 lb 1.6 oz 190 lb 4.1 oz 193 lb 2 oz  Weight (kg) 84.868 kg 86.3 kg 87.6 kg      Telemetry    Afib with RVR - Personally  Reviewed  ECG    No new EKG to review - Personally Reviewed  Physical Exam  Well developed and nourished in no acute distress HENT normal Neck supple   Carotids brisk and full without bruits Clear Irregularly irregular rate and rhythm with rapid ventricular response, no murmurs or gallops Abd-soft with active BS  No Clubbing cyanosis edema Skin-warm and dry A & Oriented  Grossly normal sensory and motor function   Labs    High Sensitivity Troponin:   Recent Labs  Lab 08/21/20 1105 08/21/20 1343  TROPONINIHS 10 10      Chemistry Recent Labs  Lab 08/21/20 1105 08/22/20 0104 08/23/20 0228 08/24/20 0327 08/25/20 0052  NA 139   < > 136 137 135  K 3.4*   < > 3.7 3.6 3.6  CL 105   < > 104 103 102  CO2 21*   < > 21* 20* 24  GLUCOSE 135*   < > 152* 117* 103*  BUN 10   < > 19 18 21   CREATININE 1.10   < > 1.13 0.98 1.17  CALCIUM 8.8*   < > 8.9  8.8* 8.4*  PROT 6.3*  --   --   --   --   ALBUMIN 3.5  --   --   --   --   AST 26  --   --   --   --   ALT 20  --   --   --   --   ALKPHOS 64  --   --   --   --   BILITOT 1.4*  --   --   --   --   GFRNONAA >60   < > >60 >60 >60  ANIONGAP 13   < > 11 14 9    < > = values in this interval not displayed.     Hematology Recent Labs  Lab 08/23/20 0228 08/24/20 0327 08/25/20 0052  WBC 11.4* 8.4 9.0  RBC 4.41 4.39 4.51  HGB 10.8* 10.7* 11.1*  HCT 34.6* 34.6* 35.5*  MCV 78.5* 78.8* 78.7*  MCH 24.5* 24.4* 24.6*  MCHC 31.2 30.9 31.3  RDW 16.1* 16.1* 16.3*  PLT 158 140* 155    BNP Recent Labs  Lab 08/21/20 1127  BNP 328.4*     DDimer No results for input(s): DDIMER in the last 168 hours.    CHA2DS2-VASc Score = 5  This indicates a 7.2% annual risk of stroke. The patient's score is based upon:   Radiology    MR SHOULDER LEFT WO CONTRAST  Result Date: 08/24/2020 CLINICAL DATA:  Left shoulder pain status post dislocation. Fell last night. EXAM: MRI OF THE LEFT SHOULDER WITHOUT CONTRAST TECHNIQUE: Multiplanar,  multisequence MR imaging of the shoulder was performed. No intravenous contrast was administered. COMPARISON:  None. FINDINGS: Rotator cuff: Complete tear of the supraspinatus and infraspinatus tendons with 3.4 cm of retraction. Teres minor tendon is intact. High-grade partial-thickness versus complete tear of the subscapularis tendon. Muscles: Muscle edema in the posterior deltoid muscle most concerning for muscle strain. Muscle edema in the supraspinatus and infraspinatus muscles most consistent with muscle strain with perifascial edema. Muscle edema in the subscapularis muscle consistent with muscle strain. Biceps Long Head: Medial dislocation of the long head of the biceps tendon from the bicipital groove. Acromioclavicular Joint: Mild arthropathy of the acromioclavicular joint. Type II acromion. Trace subacromial/subdeltoid bursal fluid. Glenohumeral Joint: Small joint effusion. Attenuation of the humeral attachment of the inferior glenohumeral ligament concerning for injury without complete disruption. No chondral defect. Labrum: Grossly intact, but evaluation is limited by lack of intraarticular fluid/contrast. Bones: No fracture or dislocation. No aggressive osseous lesion. Mild osseous contusion of the superior posterolateral humeral head as can be seen with a Hill-Sachs lesion. Other: No fluid collection or hematoma. IMPRESSION: 1. Complete tear of the supraspinatus and infraspinatus tendons with 3.4 cm of retraction. 2. High-grade partial-thickness versus complete tear of the subscapularis tendon. 3. Muscle edema in the posterior deltoid muscle most concerning for muscle strain. 4. Muscle edema in the supraspinatus and infraspinatus muscles most consistent with muscle strain with perifascial edema. 5. Muscle edema in the subscapularis muscle consistent with muscle strain. 6. Attenuation of the humeral attachment of the inferior glenohumeral ligament concerning for injury without complete disruption. 7.  Osseous contusion of the superior posterior humeral head as can be seen with a Hill-Sachs lesion as a result of the sequela of anterior shoulder dislocation. Electronically Signed   By: Kathreen Devoid   On: 08/24/2020 13:19   DG Shoulder Left Port  Result Date: 08/23/2020 CLINICAL DATA:  Closed reduction LEFT shoulder EXAM: LEFT SHOULDER  COMPARISON:  08/22/2020 FINDINGS: Initial AP image at 1122 hours demonstrates overlap of the LEFT humeral head with the glenoid likely representing persistent anterior dislocation. Second image at 1126 hours, axillary view, demonstrates either persistent anterior dislocation versus less likely marked anterior subluxation. IMPRESSION: Initial image demonstrates persistent anterior glenohumeral dislocation with the axillary view demonstrating persistent anterior dislocation versus less likely marked anterior subluxation. Findings called to Dr. Erlinda Hong on 08/23/2020 at 1410 hours. Electronically Signed   By: Lavonia Dana M.D.   On: 08/23/2020 14:13    Cardiac Studies   2D echo 08/22/2020 IMPRESSIONS    1. Left ventricular ejection fraction, by estimation, is 25 to 30%. The  left ventricle has severely decreased function. The left ventricle  demonstrates global hypokinesis. There is moderate left ventricular  hypertrophy of the basal-septal segment. Left  ventricular diastolic function could not be evaluated.  2. Right ventricular systolic function is normal. The right ventricular  size is normal. Tricuspid regurgitation signal is inadequate for assessing  PA pressure.  3. Left atrial size was severely dilated.  4. Right atrial size was mildly dilated.  5. The mitral valve is normal in structure. Mild mitral valve  regurgitation. No evidence of mitral stenosis.  6. The aortic valve is tricuspid. Aortic valve regurgitation is mild.  Mild aortic valve sclerosis is present, with no evidence of aortic valve  stenosis.  7. Aortic dilatation noted. There is mild  dilatation of the ascending  aorta, measuring 40 mm.  8. The inferior vena cava is normal in size with greater than 50%  respiratory variability, suggesting right atrial pressure of 3 mmHg.    Assessment & Plan    Atrial fibrillation with RVR  GI bleeding  TIA -- MRI negative  Congestive heart failure acute/chronic/systolic  Shoulder dislocation --in hospital fall s/p operative closed reduction    Still w rapid atrial fib-- will plan TEE DCCV  On Tues with ongoing amiodarone loading and metoprolol for rate control-- would prob hold the am of DCCV  Will change IV >> oral amiodarone  Continue Apixoban    Defer GI eval; continue iron replacement       Signed, Virl Axe, MD  08/25/2020, 10:46 AM

## 2020-08-25 NOTE — Progress Notes (Signed)
If patient is going for TEE then that would be the ideal time to proceed with endoscopy while he is in the endoscopy unit and please coordinate with my rounding partners next week and I will alert them to be on standby

## 2020-08-25 NOTE — Progress Notes (Signed)
Progress Note    Tony Roberts  GTX:[id number] DOB: 10/23/1940  DOA: 08/21/2020 PCP: Alroy Dust, L.Marlou Sa, MD    Brief Narrative:     Medical records reviewed and are as summarized below:  Tony Roberts is an 79 y.o. male with medical history significant ofHTN, GERD, asthma, diverticulosis, presented with new onset of palpitations.  On 12/3 pt got out of bed and fell onto left shoulder. experienced immediate pain and deformity. xrays showed dislocated left shoulder. Ortho Consulted.Went for surgery  Assessment/Plan:   Active Problems:   Atrial fibrillation with rapid ventricular response (HCC)   Traumatic closed displaced fracture of left shoulder with anterior dislocation    New onset A. fib with RVR -Heart rate still not well controlled.   Cardiology following-stoped cardizem due to LV dysfunction Continue on amidarone gtt 12/4: Elqiuis 5mg  bid as CHADS2VASC score 5 -will need TEE/DCCV prior to discharge if able to do with current shoulder issues -amio gtt.  Cardiomyopathy-LVEF 25-30% found on echo on 08/22/2020 Possibly due to afib rvr v.s. primary causing afib rvr.  Cardiology following Will need to add Entresto and eventually Aldactone I's and O's  Daily weight   S/p fall with left shoulder dislocation- Status post closed reduction by Dr. Erlinda Hong on 12/3 Continue arm sling -MRI:  1.Complete tear of the supraspinatus and infraspinatus tendons with 3.4 cm of retraction. 2. High-grade partial-thickness versus complete tear of the subscapularis tendon. 3. Muscle edema in the posterior deltoid muscle most concerning for muscle strain. 4. Muscle edema in the supraspinatus and infraspinatus muscles most consistent with muscle strain with perifascial edema. 5. Muscle edema in the subscapularis muscle consistent with muscle strain. 6. Attenuation of the humeral attachment of the inferior glenohumeral ligament concerning for injury without complete disruption. 7.  Osseous contusion of the superior posterior humeral head as can be seen with a Hill-Sachs lesion as a result of the sequela of anterior shoulder dislocation. -EGD on hold due to pt falling and dislocating shoulder, s/p repair- GI And cards needs to know when from orthopedic stand point pt can turn to his left side for endoscopy   Hypokalemia -replete as needed  TIAs -recent -MRI -2 weeks ago. Likely 2/2 afib LDL goal <70 -eliquis started 12/4  Chronic normocytic-microcytic anemia -was to undergo a GI work up with EGD prior to starting eliquis but this did not happen as he instead had a fall and shoulder dislocation.  S/p surgery -EGD on hold for now  Asthma,mild intermittent Continue inhalers  obesity Body mass index is 31.14 kg/m.   Family Communication/Anticipated D/C date and plan/Code Status   DVT prophylaxis: elquis Code Status: Full Code.  Disposition Plan: Status is: Inpatient  Remains inpatient appropriate because:Inpatient level of care appropriate due to severity of illness   Dispo: The patient is from: Home              Anticipated d/c is to: Home              Anticipated d/c date is: 3 days              Patient currently is not medically stable to d/c.         Medical Consultants:    Cards  Ortho  GI     Subjective:   Thinks he was having should trouble prior to fall and dislocation  Objective:    Vitals:   08/25/20 0500 08/25/20 0848 08/25/20 0850 08/25/20 1043  BP:  (!) 115/93  Marland Kitchen)  121/91  Pulse:  (!) 109 (!) 109 (!) 119  Resp:  16  17  Temp:  98 F (36.7 C)  97.7 F (36.5 C)  TempSrc:  Oral  Oral  SpO2:  95%  95%  Weight: 84.9 kg     Height:        Intake/Output Summary (Last 24 hours) at 08/25/2020 1046 Last data filed at 08/25/2020 0559 Gross per 24 hour  Intake 653.82 ml  Output 1000 ml  Net -346.18 ml   Filed Weights   08/23/20 0416 08/24/20 0354 08/25/20 0500  Weight: 87.6 kg 86.3 kg 84.9 kg     Exam:  General: Appearance:    Obese male in no acute distress     Lungs:     Clear to auscultation bilaterally, respirations unlabored  Heart:    Tachycardic. irregular No murmurs, rubs, or gallops.   MS:   All extremities are intact. Left arm in sling  Neurologic:   Awake, alert, pleasant and cooperative    Data Reviewed:   I have personally reviewed following labs and imaging studies:  Labs: Labs show the following:   Basic Metabolic Panel: Recent Labs  Lab 08/21/20 1105 08/21/20 1105 08/21/20 1629 08/22/20 0104 08/22/20 0104 08/23/20 0228 08/23/20 0228 08/24/20 0327 08/25/20 0052  NA 139  --   --  141  --  136  --  137 135  K 3.4*   < >  --  3.8   < > 3.7   < > 3.6 3.6  CL 105  --   --  105  --  104  --  103 102  CO2 21*  --   --  24  --  21*  --  20* 24  GLUCOSE 135*  --   --  121*  --  152*  --  117* 103*  BUN 10  --   --  9  --  19  --  18 21  CREATININE 1.10  --   --  1.09  --  1.13  --  0.98 1.17  CALCIUM 8.8*  --   --  9.0  --  8.9  --  8.8* 8.4*  MG  --   --  2.1  --   --   --   --   --   --   PHOS  --   --  3.3  --   --   --   --   --   --    < > = values in this interval not displayed.   GFR Estimated Creatinine Clearance: 51.3 mL/min (by C-G formula based on SCr of 1.17 mg/dL). Liver Function Tests: Recent Labs  Lab 08/21/20 1105  AST 26  ALT 20  ALKPHOS 64  BILITOT 1.4*  PROT 6.3*  ALBUMIN 3.5   No results for input(s): LIPASE, AMYLASE in the last 168 hours. No results for input(s): AMMONIA in the last 168 hours. Coagulation profile No results for input(s): INR, PROTIME in the last 168 hours.  CBC: Recent Labs  Lab 08/21/20 1105 08/22/20 0104 08/23/20 0228 08/24/20 0327 08/25/20 0052  WBC 7.2 6.6 11.4* 8.4 9.0  NEUTROABS 5.3  --   --   --   --   HGB 11.6* 11.5* 10.8* 10.7* 11.1*  HCT 38.1* 36.3* 34.6* 34.6* 35.5*  MCV 80.4 78.1* 78.5* 78.8* 78.7*  PLT 152 168 158 140* 155   Cardiac Enzymes: No results for input(s):  CKTOTAL, CKMB, CKMBINDEX, TROPONINI  in the last 168 hours. BNP (last 3 results) No results for input(s): PROBNP in the last 8760 hours. CBG: No results for input(s): GLUCAP in the last 168 hours. D-Dimer: No results for input(s): DDIMER in the last 72 hours. Hgb A1c: No results for input(s): HGBA1C in the last 72 hours. Lipid Profile: No results for input(s): CHOL, HDL, LDLCALC, TRIG, CHOLHDL, LDLDIRECT in the last 72 hours. Thyroid function studies: No results for input(s): TSH, T4TOTAL, T3FREE, THYROIDAB in the last 72 hours.  Invalid input(s): FREET3 Anemia work up: No results for input(s): VITAMINB12, FOLATE, FERRITIN, TIBC, IRON, RETICCTPCT in the last 72 hours. Sepsis Labs: Recent Labs  Lab 08/22/20 0104 08/23/20 0228 08/24/20 0327 08/25/20 0052  WBC 6.6 11.4* 8.4 9.0    Microbiology Recent Results (from the past 240 hour(s))  Resp Panel by RT-PCR (Flu A&B, Covid) Nasopharyngeal Swab     Status: None   Collection Time: 08/21/20  2:18 PM   Specimen: Nasopharyngeal Swab; Nasopharyngeal(NP) swabs in vial transport medium  Result Value Ref Range Status   SARS Coronavirus 2 by RT PCR NEGATIVE NEGATIVE Final    Comment: (NOTE) SARS-CoV-2 target nucleic acids are NOT DETECTED.  The SARS-CoV-2 RNA is generally detectable in upper respiratory specimens during the acute phase of infection. The lowest concentration of SARS-CoV-2 viral copies this assay can detect is 138 copies/mL. A negative result does not preclude SARS-Cov-2 infection and should not be used as the sole basis for treatment or other patient management decisions. A negative result may occur with  improper specimen collection/handling, submission of specimen other than nasopharyngeal swab, presence of viral mutation(s) within the areas targeted by this assay, and inadequate number of viral copies(<138 copies/mL). A negative result must be combined with clinical observations, patient history, and  epidemiological information. The expected result is Negative.  Fact Sheet for Patients:  EntrepreneurPulse.com.au  Fact Sheet for Healthcare Providers:  IncredibleEmployment.be  This test is no t yet approved or cleared by the [place] FDA and  has been authorized for detection and/or diagnosis of SARS-CoV-2 by FDA under an Emergency Use Authorization (EUA). This EUA will remain  in effect (meaning this test can be used) for the duration of the COVID-19 declaration under Section 564(b)(1) of the Act, 21 U.S.C.section 360bbb-3(b)(1), unless the authorization is terminated  or revoked sooner.       Influenza A by PCR NEGATIVE NEGATIVE Final   Influenza B by PCR NEGATIVE NEGATIVE Final    Comment: (NOTE) The Xpert Xpress SARS-CoV-2/FLU/RSV plus assay is intended as an aid in the diagnosis of influenza from Nasopharyngeal swab specimens and should not be used as a sole basis for treatment. Nasal washings and aspirates are unacceptable for Xpert Xpress SARS-CoV-2/FLU/RSV testing.  Fact Sheet for Patients: EntrepreneurPulse.com.au  Fact Sheet for Healthcare Providers: IncredibleEmployment.be  This test is not yet approved or cleared by the [place] FDA and has been authorized for detection and/or diagnosis of SARS-CoV-2 by FDA under an Emergency Use Authorization (EUA). This EUA will remain in effect (meaning this test can be used) for the duration of the COVID-19 declaration under Section 564(b)(1) of the Act, 21 U.S.C. section 360bbb-3(b)(1), unless the authorization is terminated or revoked.  Performed at [facility], [place] [address] [place], [place] [address]     Procedures and diagnostic studies:  MR SHOULDER LEFT WO CONTRAST  Result Date: 08/24/2020 CLINICAL DATA:  Left shoulder pain status post dislocation. Fell last night. EXAM: MRI OF THE LEFT SHOULDER  WITHOUT CONTRAST  TECHNIQUE: Multiplanar, multisequence MR imaging of the shoulder was performed. No intravenous contrast was administered. COMPARISON:  None. FINDINGS: Rotator cuff: Complete tear of the supraspinatus and infraspinatus tendons with 3.4 cm of retraction. Teres minor tendon is intact. High-grade partial-thickness versus complete tear of the subscapularis tendon. Muscles: Muscle edema in the posterior deltoid muscle most concerning for muscle strain. Muscle edema in the supraspinatus and infraspinatus muscles most consistent with muscle strain with perifascial edema. Muscle edema in the subscapularis muscle consistent with muscle strain. Biceps Long Head: Medial dislocation of the long head of the biceps tendon from the bicipital groove. Acromioclavicular Joint: Mild arthropathy of the acromioclavicular joint. Type II acromion. Trace subacromial/subdeltoid bursal fluid. Glenohumeral Joint: Small joint effusion. Attenuation of the humeral attachment of the inferior glenohumeral ligament concerning for injury without complete disruption. No chondral defect. Labrum: Grossly intact, but evaluation is limited by lack of intraarticular fluid/contrast. Bones: No fracture or dislocation. No aggressive osseous lesion. Mild osseous contusion of the superior posterolateral humeral head as can be seen with a Hill-Sachs lesion. Other: No fluid collection or hematoma. IMPRESSION: 1. Complete tear of the supraspinatus and infraspinatus tendons with 3.4 cm of retraction. 2. High-grade partial-thickness versus complete tear of the subscapularis tendon. 3. Muscle edema in the posterior deltoid muscle most concerning for muscle strain. 4. Muscle edema in the supraspinatus and infraspinatus muscles most consistent with muscle strain with perifascial edema. 5. Muscle edema in the subscapularis muscle consistent with muscle strain. 6. Attenuation of the humeral attachment of the inferior glenohumeral ligament concerning for injury without  complete disruption. 7. Osseous contusion of the superior posterior humeral head as can be seen with a Hill-Sachs lesion as a result of the sequela of anterior shoulder dislocation. Electronically Signed   By: Kathreen Devoid   On: 08/24/2020 13:19   DG Shoulder Left Port  Result Date: 08/23/2020 CLINICAL DATA:  Closed reduction LEFT shoulder EXAM: LEFT SHOULDER COMPARISON:  08/22/2020 FINDINGS: Initial AP image at 1122 hours demonstrates overlap of the LEFT humeral head with the glenoid likely representing persistent anterior dislocation. Second image at 1126 hours, axillary view, demonstrates either persistent anterior dislocation versus less likely marked anterior subluxation. IMPRESSION: Initial image demonstrates persistent anterior glenohumeral dislocation with the axillary view demonstrating persistent anterior dislocation versus less likely marked anterior subluxation. Findings called to Dr. Erlinda Hong on 08/23/2020 at 1410 hours. Electronically Signed   By: Lavonia Dana M.D.   On: 08/23/2020 14:13    Medications:   . apixaban  5 mg Oral BID  . famotidine  40 mg Oral QHS  . ferrous sulfate  325 mg Oral BID WC  . fluticasone furoate-vilanterol  1 puff Inhalation Daily  . furosemide  40 mg Intravenous Once  . furosemide  40 mg Intravenous Daily  . metoprolol tartrate  75 mg Oral BID  . montelukast  10 mg Oral QHS  . pantoprazole  40 mg Oral Daily  . sodium chloride flush  3 mL Intravenous Q12H   Continuous Infusions: . sodium chloride    . amiodarone 30 mg/hr (08/25/20 0400)  . lactated ringers 10 mL/hr at 08/23/20 1113     LOS: 4 days   Geradine Girt  [facility]   How to contact the [facility] Attending or Consulting provider [place] or covering provider during after hours [place], for this patient?  1. Check the care team in [facility] and look for a) attending/consulting [facility] provider listed and b) the [facility] team  listed 2. Log into [email or web address] and use [place]'s universal password to  access. If you do not have the password, please contact the hospital operator. 3. Locate the [facility] provider you are looking for under [facility] and page to a number that you can be directly reached. 4. If you still have difficulty reaching the provider, please page the [facility] (Director on Call) for the Hospitalists listed on amion for assistance.  08/25/2020, 10:46 AM

## 2020-08-25 NOTE — Significant Event (Signed)
Rapid Response MEWS Note   Reason for Call :  MEWS:  T 97.9, BP 107/96, HR 123, RR 18, SpO2 94% on room air No issues reported from RN. Rapid response not needed at this time. Per cardiology note: will plan TEE DCCV on Tues. RN to follow MEWS protocol and call rapid response if needed.  Event Summary:  Call Time: [place], RN

## 2020-08-25 NOTE — Plan of Care (Signed)

## 2020-08-26 ENCOUNTER — Other Ambulatory Visit: Payer: Self-pay | Admitting: Physician Assistant

## 2020-08-26 DIAGNOSIS — I4891 Unspecified atrial fibrillation: Secondary | ICD-10-CM | POA: Diagnosis not present

## 2020-08-26 LAB — BASIC METABOLIC PANEL
Anion gap: 11 (ref 5–15)
BUN: 23 mg/dL (ref 8–23)
CO2: 25 mmol/L (ref 22–32)
Calcium: 8.6 mg/dL — ABNORMAL LOW (ref 8.9–10.3)
Chloride: 101 mmol/L (ref 98–111)
Creatinine, Ser: 1.31 mg/dL — ABNORMAL HIGH (ref 0.61–1.24)
GFR, Estimated: 55 mL/min — ABNORMAL LOW (ref >60)
Glucose, Bld: 108 mg/dL — ABNORMAL HIGH (ref 70–99)
Potassium: 3.1 mmol/L — ABNORMAL LOW (ref 3.5–5.1)
Sodium: 137 mmol/L (ref 135–145)

## 2020-08-26 LAB — CBC
HCT: 38.2 % — ABNORMAL LOW (ref 39.0–52.0)
Hemoglobin: 12.4 g/dL — ABNORMAL LOW (ref 13.0–17.0)
MCH: 25.1 pg — ABNORMAL LOW (ref 26.0–34.0)
MCHC: 32.5 g/dL (ref 30.0–36.0)
MCV: 77.2 fL — ABNORMAL LOW (ref 80.0–100.0)
Platelets: 189 10*3/uL (ref 150–400)
RBC: 4.95 MIL/uL (ref 4.22–5.81)
RDW: 16.2 % — ABNORMAL HIGH (ref 11.5–15.5)
WBC: 9.3 10*3/uL (ref 4.0–10.5)
nRBC: 0 % (ref 0.0–0.2)

## 2020-08-26 LAB — MAGNESIUM: Magnesium: 2 mg/dL (ref 1.7–2.4)

## 2020-08-26 MED ORDER — POTASSIUM CHLORIDE CRYS ER 20 MEQ PO TBCR
40.0000 meq | EXTENDED_RELEASE_TABLET | Freq: Once | ORAL | Status: AC
  Administered 2020-08-26: 40 meq via ORAL
  Filled 2020-08-26: qty 2

## 2020-08-26 MED ORDER — SODIUM CHLORIDE 0.9 % IV SOLN: INTRAVENOUS | Status: DC

## 2020-08-26 MED ORDER — FUROSEMIDE 10 MG/ML IJ SOLN
40.0000 mg | Freq: Every day | INTRAMUSCULAR | Status: DC
  Administered 2020-08-26 – 2020-08-27 (×2): 40 mg via INTRAVENOUS
  Filled 2020-08-26 (×2): qty 4

## 2020-08-26 NOTE — Progress Notes (Signed)
Progress Note    Tony Roberts  ZOX:[id number] DOB: 1941/05/01  DOA: 08/21/2020 PCP: Alroy Dust, L.Marlou Sa, MD    Brief Narrative:     Medical records reviewed and are as summarized below:  Tony Roberts is an 79 y.o. male with medical history significant ofHTN, GERD, asthma, diverticulosis, presented with new onset of palpitations.  On 12/3 pt got out of bed and fell onto left shoulder. experienced immediate pain and deformity. xrays showed dislocated left shoulder. Ortho Consulted went for surgery  Assessment/Plan:   Active Problems:   Atrial fibrillation with rapid ventricular response (HCC)   Traumatic closed displaced fracture of left shoulder with anterior dislocation    New onset A. fib with RVR -Heart rate still not well controlled.   Cardiology following-stoped cardizem due to LV dysfunction Continue on amidarone gtt 12/4: Elqiuis 5mg  bid as CHADS2VASC score 5 -will need TEE/DCCV- planned for 12/7  Cardiomyopathy-LVEF 25-30% found on echo on 08/22/2020 Possibly due to afib rvr v.s. primary causing afib rvr.  Cardiology following Will need to add Entresto and eventually Aldactone I's and O's  Daily weight   S/p fall with left shoulder dislocation- Status post closed reduction by Dr. Erlinda Hong on 12/3 Continue arm sling -MRI:  1.Complete tear of the supraspinatus and infraspinatus tendons with 3.4 cm of retraction. 2. High-grade partial-thickness versus complete tear of the subscapularis tendon. 3. Muscle edema in the posterior deltoid muscle most concerning for muscle strain. 4. Muscle edema in the supraspinatus and infraspinatus muscles most consistent with muscle strain with perifascial edema. 5. Muscle edema in the subscapularis muscle consistent with muscle strain. 6. Attenuation of the humeral attachment of the inferior glenohumeral ligament concerning for injury without complete disruption. 7. Osseous contusion of the superior posterior humeral head  as can be seen with a Hill-Sachs lesion as a result of the sequela of anterior shoulder dislocation. -per ortho, can not lay on left side  Hypokalemia -replete as needed  TIAs -recent -MRI -2 weeks ago. Likely 2/2 afib LDL goal <70 -eliquis started 12/4  Chronic normocytic-microcytic anemia -was to undergo a GI work up with EGD prior to starting eliquis but this did not happen as he instead had a fall and shoulder dislocation.  S/p surgery -Hgb stable on eliquis-- defer to GI if he needs EGD   Asthma,mild intermittent Continue inhalers  obesity Body mass index is 30.74 kg/m.   Family Communication/Anticipated D/C date and plan/Code Status   DVT prophylaxis: elquis Code Status: Full Code.  Disposition Plan: Status is: Inpatient  Remains inpatient appropriate because:Inpatient level of care appropriate due to severity of illness   Dispo: The patient is from: Home              Anticipated d/c is to: Home              Anticipated d/c date is: 2 days              Patient currently is not medically stable to d/c.- needs TEE/cardioversion         Medical Consultants:    Cards  Ortho  GI     Subjective:  No overnight events, no SOB  Objective:    Vitals:   08/25/20 2307 08/26/20 0400 08/26/20 0732 08/26/20 0830  BP: (!) 138/121 (!) 121/97 119/89   Pulse: (!) 110 100 (!) 125 (!) 131  Resp: 15 15 18    Temp: 98.4 F (36.9 C) 98.2 F (36.8 C)    TempSrc:  Oral Oral    SpO2: 90% 97%    Weight:  83.8 kg    Height:        Intake/Output Summary (Last 24 hours) at 08/26/2020 1028 Last data filed at 08/26/2020 0951 Gross per 24 hour  Intake 447.35 ml  Output 800 ml  Net -352.65 ml   Filed Weights   08/24/20 0354 08/25/20 0500 08/26/20 0400  Weight: 86.3 kg 84.9 kg 83.8 kg    Exam:  General: Appearance:    Obese male in no acute distress     Lungs:      respirations unlabored  Heart:    Tachycardic. irregular No murmurs, rubs, or  gallops.   MS:   All extremities are intact.   Neurologic:   Awake, alert, oriented x 3. No apparent focal neurological           defect.      Data Reviewed:   I have personally reviewed following labs and imaging studies:  Labs: Labs show the following:   Basic Metabolic Panel: Recent Labs  Lab 08/21/20 1105 08/21/20 1629 08/22/20 0104 08/22/20 0104 08/23/20 0228 08/23/20 0228 08/24/20 0327 08/24/20 0327 08/25/20 0052 08/26/20 0110  NA   < >  --  141  --  136  --  137  --  135 137  K   < >  --  3.8   < > 3.7   < > 3.6   < > 3.6 3.1*  CL   < >  --  105  --  104  --  103  --  102 101  CO2   < >  --  24  --  21*  --  20*  --  24 25  GLUCOSE   < >  --  121*  --  152*  --  117*  --  103* 108*  BUN   < >  --  9  --  19  --  18  --  21 23  CREATININE   < >  --  1.09  --  1.13  --  0.98  --  1.17 1.31*  CALCIUM   < >  --  9.0  --  8.9  --  8.8*  --  8.4* 8.6*  MG  --  2.1  --   --   --   --   --   --   --  2.0  PHOS  --  3.3  --   --   --   --   --   --   --   --    < > = values in this interval not displayed.   GFR Estimated Creatinine Clearance: 45.5 mL/min (A) (by C-G formula based on SCr of 1.31 mg/dL (H)). Liver Function Tests: Recent Labs  Lab 08/21/20 1105  AST 26  ALT 20  ALKPHOS 64  BILITOT 1.4*  PROT 6.3*  ALBUMIN 3.5   No results for input(s): LIPASE, AMYLASE in the last 168 hours. No results for input(s): AMMONIA in the last 168 hours. Coagulation profile No results for input(s): INR, PROTIME in the last 168 hours.  CBC: Recent Labs  Lab 08/21/20 1105 08/21/20 1105 08/22/20 0104 08/23/20 0228 08/24/20 0327 08/25/20 0052 08/26/20 0110  WBC 7.2   < > 6.6 11.4* 8.4 9.0 9.3  NEUTROABS 5.3  --   --   --   --   --   --   HGB 11.6*   < >  11.5* 10.8* 10.7* 11.1* 12.4*  HCT 38.1*   < > 36.3* 34.6* 34.6* 35.5* 38.2*  MCV 80.4   < > 78.1* 78.5* 78.8* 78.7* 77.2*  PLT 152   < > 168 158 140* 155 189   < > = values in this interval not displayed.    Cardiac Enzymes: No results for input(s): CKTOTAL, CKMB, CKMBINDEX, TROPONINI in the last 168 hours. BNP (last 3 results) No results for input(s): PROBNP in the last 8760 hours. CBG: No results for input(s): GLUCAP in the last 168 hours. D-Dimer: No results for input(s): DDIMER in the last 72 hours. Hgb A1c: No results for input(s): HGBA1C in the last 72 hours. Lipid Profile: No results for input(s): CHOL, HDL, LDLCALC, TRIG, CHOLHDL, LDLDIRECT in the last 72 hours. Thyroid function studies: No results for input(s): TSH, T4TOTAL, T3FREE, THYROIDAB in the last 72 hours.  Invalid input(s): FREET3 Anemia work up: No results for input(s): VITAMINB12, FOLATE, FERRITIN, TIBC, IRON, RETICCTPCT in the last 72 hours. Sepsis Labs: Recent Labs  Lab 08/23/20 0228 08/24/20 0327 08/25/20 0052 08/26/20 0110  WBC 11.4* 8.4 9.0 9.3    Microbiology Recent Results (from the past 240 hour(s))  Resp Panel by RT-PCR (Flu A&B, Covid) Nasopharyngeal Swab     Status: None   Collection Time: 08/21/20  2:18 PM   Specimen: Nasopharyngeal Swab; Nasopharyngeal(NP) swabs in vial transport medium  Result Value Ref Range Status   SARS Coronavirus 2 by RT PCR NEGATIVE NEGATIVE Final    Comment: (NOTE) SARS-CoV-2 target nucleic acids are NOT DETECTED.  The SARS-CoV-2 RNA is generally detectable in upper respiratory specimens during the acute phase of infection. The lowest concentration of SARS-CoV-2 viral copies this assay can detect is 138 copies/mL. A negative result does not preclude SARS-Cov-2 infection and should not be used as the sole basis for treatment or other patient management decisions. A negative result may occur with  improper specimen collection/handling, submission of specimen other than nasopharyngeal swab, presence of viral mutation(s) within the areas targeted by this assay, and inadequate number of viral copies(<138 copies/mL). A negative result must be combined with clinical  observations, patient history, and epidemiological information. The expected result is Negative.  Fact Sheet for Patients:  EntrepreneurPulse.com.au  Fact Sheet for Healthcare Providers:  IncredibleEmployment.be  This test is no t yet approved or cleared by the [place] FDA and  has been authorized for detection and/or diagnosis of SARS-CoV-2 by FDA under an Emergency Use Authorization (EUA). This EUA will remain  in effect (meaning this test can be used) for the duration of the COVID-19 declaration under Section 564(b)(1) of the Act, 21 U.S.C.section 360bbb-3(b)(1), unless the authorization is terminated  or revoked sooner.       Influenza A by PCR NEGATIVE NEGATIVE Final   Influenza B by PCR NEGATIVE NEGATIVE Final    Comment: (NOTE) The Xpert Xpress SARS-CoV-2/FLU/RSV plus assay is intended as an aid in the diagnosis of influenza from Nasopharyngeal swab specimens and should not be used as a sole basis for treatment. Nasal washings and aspirates are unacceptable for Xpert Xpress SARS-CoV-2/FLU/RSV testing.  Fact Sheet for Patients: EntrepreneurPulse.com.au  Fact Sheet for Healthcare Providers: IncredibleEmployment.be  This test is not yet approved or cleared by the [place] FDA and has been authorized for detection and/or diagnosis of SARS-CoV-2 by FDA under an Emergency Use Authorization (EUA). This EUA will remain in effect (meaning this test can be used) for the duration of the COVID-19 declaration under Section  564(b)(1) of the Act, 21 U.S.C. section 360bbb-3(b)(1), unless the authorization is terminated or revoked.  Performed at [facility], [place] [address], [place], [place] [address]     Procedures and diagnostic studies:  MR SHOULDER LEFT WO CONTRAST  Result Date: 08/24/2020 CLINICAL DATA:  Left shoulder pain status post dislocation. Fell last night. EXAM: MRI OF THE LEFT  SHOULDER WITHOUT CONTRAST TECHNIQUE: Multiplanar, multisequence MR imaging of the shoulder was performed. No intravenous contrast was administered. COMPARISON:  None. FINDINGS: Rotator cuff: Complete tear of the supraspinatus and infraspinatus tendons with 3.4 cm of retraction. Teres minor tendon is intact. High-grade partial-thickness versus complete tear of the subscapularis tendon. Muscles: Muscle edema in the posterior deltoid muscle most concerning for muscle strain. Muscle edema in the supraspinatus and infraspinatus muscles most consistent with muscle strain with perifascial edema. Muscle edema in the subscapularis muscle consistent with muscle strain. Biceps Long Head: Medial dislocation of the long head of the biceps tendon from the bicipital groove. Acromioclavicular Joint: Mild arthropathy of the acromioclavicular joint. Type II acromion. Trace subacromial/subdeltoid bursal fluid. Glenohumeral Joint: Small joint effusion. Attenuation of the humeral attachment of the inferior glenohumeral ligament concerning for injury without complete disruption. No chondral defect. Labrum: Grossly intact, but evaluation is limited by lack of intraarticular fluid/contrast. Bones: No fracture or dislocation. No aggressive osseous lesion. Mild osseous contusion of the superior posterolateral humeral head as can be seen with a Hill-Sachs lesion. Other: No fluid collection or hematoma. IMPRESSION: 1. Complete tear of the supraspinatus and infraspinatus tendons with 3.4 cm of retraction. 2. High-grade partial-thickness versus complete tear of the subscapularis tendon. 3. Muscle edema in the posterior deltoid muscle most concerning for muscle strain. 4. Muscle edema in the supraspinatus and infraspinatus muscles most consistent with muscle strain with perifascial edema. 5. Muscle edema in the subscapularis muscle consistent with muscle strain. 6. Attenuation of the humeral attachment of the inferior glenohumeral ligament  concerning for injury without complete disruption. 7. Osseous contusion of the superior posterior humeral head as can be seen with a Hill-Sachs lesion as a result of the sequela of anterior shoulder dislocation. Electronically Signed   By: Kathreen Devoid   On: 08/24/2020 13:19    Medications:   . apixaban  5 mg Oral BID  . famotidine  40 mg Oral QHS  . ferrous sulfate  325 mg Oral BID WC  . fluticasone furoate-vilanterol  1 puff Inhalation Daily  . furosemide  40 mg Intravenous Daily  . metoprolol tartrate  75 mg Oral BID  . montelukast  10 mg Oral QHS  . pantoprazole  40 mg Oral Daily  . sodium chloride flush  3 mL Intravenous Q12H   Continuous Infusions: . sodium chloride    . sodium chloride 20 mL/hr at 08/26/20 0942  . amiodarone 30 mg/hr (08/26/20 0636)  . lactated ringers 10 mL/hr at 08/23/20 1113     LOS: 5 days   Geradine Girt  [facility]   How to contact the [facility] Attending or Consulting provider [place] or covering provider during after hours [place], for this patient?  1. Check the care team in [facility] and look for a) attending/consulting [facility] provider listed and b) the [facility] team listed 2. Log into [email or web address] and use [place]'s universal password to access. If you do not have the password, please contact the hospital operator. 3. Locate the [facility] provider you are looking for under [facility] and page to a number that  you can be directly reached. 4. If you still have difficulty reaching the provider, please page the [facility] (Director on Call) for the Hospitalists listed on amion for assistance.  08/26/2020, 10:28 AM

## 2020-08-26 NOTE — Progress Notes (Signed)
Progress Note  Patient Name: Tony Roberts Date of Encounter: 08/26/2020  [facility] Cardiologist: Dr Radford Pax  Subjective   No CP; mild dyspnea  Inpatient Medications    Scheduled Meds: . apixaban  5 mg Oral BID  . famotidine  40 mg Oral QHS  . ferrous sulfate  325 mg Oral BID WC  . fluticasone furoate-vilanterol  1 puff Inhalation Daily  . furosemide  40 mg Intravenous Once  . furosemide  40 mg Intravenous Daily  . metoprolol tartrate  75 mg Oral BID  . montelukast  10 mg Oral QHS  . pantoprazole  40 mg Oral Daily  . sodium chloride flush  3 mL Intravenous Q12H   Continuous Infusions: . sodium chloride    . amiodarone 30 mg/hr (08/26/20 0636)  . lactated ringers 10 mL/hr at 08/23/20 1113   PRN Meds: sodium chloride, acetaminophen, levalbuterol, morphine injection, ondansetron (ZOFRAN) IV, sodium chloride flush, traMADol, triamcinolone   Vital Signs    Vitals:   08/25/20 1909 08/25/20 2115 08/25/20 2307 08/26/20 0400  BP: (!) 118/93 109/89 (!) 138/121 (!) 121/97  Pulse: (!) 124 (!) 124 (!) 110 100  Resp: 16  15 15   Temp: 98.4 F (36.9 C)  98.4 F (36.9 C) 98.2 F (36.8 C)  TempSrc: Oral  Oral Oral  SpO2: 90%  90% 97%  Weight:    83.8 kg  Height:        Intake/Output Summary (Last 24 hours) at 08/26/2020 0713 Last data filed at 08/26/2020 0636 Gross per 24 hour  Intake 441.35 ml  Output 1100 ml  Net -658.65 ml   Last 3 Weights 08/26/2020 08/25/2020 08/24/2020  Weight (lbs) 184 lb 11.9 oz 187 lb 1.6 oz 190 lb 4.1 oz  Weight (kg) 83.8 kg 84.868 kg 86.3 kg      Telemetry    Atrial fibrillation with RVR; 11 beats NSVT - Personally Reviewed   Physical Exam   GEN: No acute distress.   Neck: No JVD Cardiac: Irregular and tachycardic Respiratory: Clear to auscultation bilaterally. GI: Soft, nontender, non-distended  MS: No edema; LUE in sling Neuro:  Nonfocal  Psych: Normal affect   Labs    High Sensitivity Troponin:   Recent Labs  Lab  08/21/20 1105 08/21/20 1343  TROPONINIHS 10 10      Chemistry Recent Labs  Lab 08/21/20 1105 08/22/20 0104 08/24/20 0327 08/25/20 0052 08/26/20 0110  NA 139   < > 137 135 137  K 3.4*   < > 3.6 3.6 3.1*  CL 105   < > 103 102 101  CO2 21*   < > 20* 24 25  GLUCOSE 135*   < > 117* 103* 108*  BUN 10   < > 18 21 23   CREATININE 1.10   < > 0.98 1.17 1.31*  CALCIUM 8.8*   < > 8.8* 8.4* 8.6*  PROT 6.3*  --   --   --   --   ALBUMIN 3.5  --   --   --   --   AST 26  --   --   --   --   ALT 20  --   --   --   --   ALKPHOS 64  --   --   --   --   BILITOT 1.4*  --   --   --   --   GFRNONAA >60   < > >60 >60 55*  ANIONGAP 13   < >  14 9 11    < > = values in this interval not displayed.     Hematology Recent Labs  Lab 08/24/20 0327 08/25/20 0052 08/26/20 0110  WBC 8.4 9.0 9.3  RBC 4.39 4.51 4.95  HGB 10.7* 11.1* 12.4*  HCT 34.6* 35.5* 38.2*  MCV 78.8* 78.7* 77.2*  MCH 24.4* 24.6* 25.1*  MCHC 30.9 31.3 32.5  RDW 16.1* 16.3* 16.2*  PLT 140* 155 189    BNP Recent Labs  Lab 08/21/20 1127  BNP 328.4*     Radiology    MR SHOULDER LEFT WO CONTRAST  Result Date: 08/24/2020 CLINICAL DATA:  Left shoulder pain status post dislocation. Fell last night. EXAM: MRI OF THE LEFT SHOULDER WITHOUT CONTRAST TECHNIQUE: Multiplanar, multisequence MR imaging of the shoulder was performed. No intravenous contrast was administered. COMPARISON:  None. FINDINGS: Rotator cuff: Complete tear of the supraspinatus and infraspinatus tendons with 3.4 cm of retraction. Teres minor tendon is intact. High-grade partial-thickness versus complete tear of the subscapularis tendon. Muscles: Muscle edema in the posterior deltoid muscle most concerning for muscle strain. Muscle edema in the supraspinatus and infraspinatus muscles most consistent with muscle strain with perifascial edema. Muscle edema in the subscapularis muscle consistent with muscle strain. Biceps Long Head: Medial dislocation of the long head of  the biceps tendon from the bicipital groove. Acromioclavicular Joint: Mild arthropathy of the acromioclavicular joint. Type II acromion. Trace subacromial/subdeltoid bursal fluid. Glenohumeral Joint: Small joint effusion. Attenuation of the humeral attachment of the inferior glenohumeral ligament concerning for injury without complete disruption. No chondral defect. Labrum: Grossly intact, but evaluation is limited by lack of intraarticular fluid/contrast. Bones: No fracture or dislocation. No aggressive osseous lesion. Mild osseous contusion of the superior posterolateral humeral head as can be seen with a Hill-Sachs lesion. Other: No fluid collection or hematoma. IMPRESSION: 1. Complete tear of the supraspinatus and infraspinatus tendons with 3.4 cm of retraction. 2. High-grade partial-thickness versus complete tear of the subscapularis tendon. 3. Muscle edema in the posterior deltoid muscle most concerning for muscle strain. 4. Muscle edema in the supraspinatus and infraspinatus muscles most consistent with muscle strain with perifascial edema. 5. Muscle edema in the subscapularis muscle consistent with muscle strain. 6. Attenuation of the humeral attachment of the inferior glenohumeral ligament concerning for injury without complete disruption. 7. Osseous contusion of the superior posterior humeral head as can be seen with a Hill-Sachs lesion as a result of the sequela of anterior shoulder dislocation. Electronically Signed   By: Kathreen Devoid   On: 08/24/2020 13:19    Patient Profile     79 y.o. male with past medical history of hypertension, Gastrosoft reflux disease with atrial fibrillation with rapid ventricular response.  Echocardiogram shows ejection fraction 25 to 30%, moderate left ventricular hypertrophy, severe left atrial enlargement, mild right atrial enlargement, mild mitral regurgitation, mild aortic insufficiency and mildly dilated ascending aorta.  Also with anemia and shoulder injury.   Assessment & Plan    1 atrial fibrillation with rapid ventricular response-heart rate remains elevated.  Plan to continue amiodarone and metoprolol.  Continue apixaban.  Note TSH normal.  Plan TEE guided cardioversion tomorrow.  2 cardiomyopathy-etiology unclear but may be tachycardia mediated.  Blood pressure has been borderline.  Once patient in sinus rhythm will add ARB or Entresto as blood pressure allows.  Will change metoprolol to Toprol at discharge.  Will need repeat echocardiogram 3 months after sinus reestablished.  If LV function remains reduced would need ischemia evaluation.  3 nonsustained  ventricular tachycardia-noted on telemetry.  Continue beta-blocker and amiodarone.  4 anemia-gastroenterology following.  Plan is to potentially coordinate endoscopy at time of TEE tomorrow.  Continue Protonix.  Follow hemoglobin.  5 acute systolic congestive heart failure-likely exacerbated by atrial fibrillation with rapid ventricular spots.  Continue Lasix at present dose.  Follow renal function.  6 shoulder dislocation-Per orthopedics.  7 possible history of TIAs-continue apixaban given newly diagnosed atrial fibrillation.  8 hypokalemia-supplement.  For questions or updates, please contact [place] Please consult [email or web address] for contact info under        Signed, Kirk Ruths, MD  08/26/2020, 7:13 AM

## 2020-08-26 NOTE — Plan of Care (Signed)

## 2020-08-26 NOTE — Progress Notes (Signed)
   08/26/20 1935  Assess: MEWS Score  Temp 98.8 F (37.1 C)  BP 109/83  Pulse Rate (!) 130  ECG Heart Rate (!) 126  Resp 18  Level of Consciousness Alert  SpO2 96 %  O2 Device Room Air  Patient Activity (if Appropriate) In bed  Assess: MEWS Score  MEWS Temp 0  MEWS Systolic 0  MEWS Pulse 2  MEWS RR 0  MEWS LOC 0  MEWS Score 2  MEWS Score Color Yellow  Assess: if the MEWS score is Yellow or Red  Were vital signs taken at a resting state? Yes  Focused Assessment No change from prior assessment  Early Detection of Sepsis Score *See Row Information* Low  MEWS guidelines implemented *See Row Information* No, previously yellow, continue vital signs every 4 hours  Treat  Pain Scale 0-10  Pain Score 0

## 2020-08-26 NOTE — Progress Notes (Signed)
Heart Failure Stewardship Pharmacist Progress Note   PCP: Alroy Dust, L.Marlou Sa, MD PCP-Cardiologist: No primary care provider on file.    HPI:  79 yo M with PMH of HTN and GERD. He presented to the ED on 08/21/20 with DOE, increased cough, and palpitations. He was found to be in afib with RVR. An ECHO was done on 08/22/20 and LVEF is 25-30%. TEE/DCCV scheduled for 08/27/20.  Current HF Medications: Furosemide 40 mg IV daily Metoprolol tartrate 75 mg BID  Prior to admission HF Medications: None  Pertinent Lab Values: . Serum creatinine 1.31, BUN 23, Potassium 3.1, Sodium 137, BNP 328, Magnesium 2.1   Vital Signs: . Weight: 184 lbs (admission weight: 187 lbs) . Blood pressure: 120/90s  . Heart rate: 110-120s   Medication Assistance / Insurance Benefits Check: Does the patient have prescription insurance?  Yes Type of insurance plan: UHC Medicare  Does the patient qualify for medication assistance through manufacturers or grants?   Pending household income information . Eligible grants and/or patient assistance programs: TBD . Medication assistance applications in progress: none  . Medication assistance applications approved: none Approved medication assistance renewals will be completed by: [place]:  Prior to admission outpatient pharmacy: [facility] outpatient pharmacy Is the patient willing to use [facility] at discharge? Yes Is the patient willing to transition their outpatient pharmacy to utilize a [facility] outpatient pharmacy?   Yes    Assessment: 1. Acute systolic CHF (EF 64-35%), likely due to afib with RVR. NYHA class II symptoms. - Continue furosemide 40 mg IV. Potassium replaced (goal >4.0) - Metoprolol 75 mg BID. Consider transitioning to XL once max-tolerated dose achieved. - Consider starting ARB/Entresto given new low EF following TEE/DCCV - Consider starting Farxiga +/- spironolactone prior to discharge pending SCr and BP trends - Not a candidate  for ivabradine for further HR reduction given concurrent afib - Agree with stopping diltiazem given LV dysfunction   Plan: 1) Medication changes recommended at this time: - Continue current regimen; start Entresto following TEE/cardioversion scheduled for tomorrow  2) Patient assistance application(s): - Entresto copay $47 per month - Farxiga copay $47 per month Can help enroll patient in patient assistance with drug manufacturers to bring copay down to $0 per fill for both Delene Loll and Farxiga  3)  Education  - To be completed prior to discharge  Kerby Nora, PharmD, BCPS Heart Failure Cytogeneticist Phone [phone]

## 2020-08-26 NOTE — Progress Notes (Signed)
[facility] Progress Note  Tony Roberts 79 y.o. 1940-12-18  CC: Iron deficiency anemia    Subjective: Patient seen and examined at bedside.  Denies any GI symptoms.  Denies any bleeding episodes.  ROS : Afebrile.  Positive for palpitation.   Objective: Vital signs in last 24 hours: Vitals:   08/26/20 0732 08/26/20 0830  BP: 119/89   Pulse: (!) 125 (!) 131  Resp: 18   Temp:    SpO2:      Physical Exam:  General:  Alert, cooperative, no distress, appears stated age  Head:  Normocephalic, without obvious abnormality, atraumatic  Eyes:  , EOM's intact,   Lungs:   Clear to auscultation bilaterally, respirations unlabored  Heart:   Irregularly irregular rate  Abdomen:   Soft, non-tender, nondistended, bowel sounds present.  No peritoneal signs.  Extremities: Extremities normal, atraumatic, no  edema       Lab Results: Recent Labs    08/25/20 0052 08/26/20 0110  NA 135 137  K 3.6 3.1*  CL 102 101  CO2 24 25  GLUCOSE 103* 108*  BUN 21 23  CREATININE 1.17 1.31*  CALCIUM 8.4* 8.6*  MG  --  2.0   No results for input(s): AST, ALT, ALKPHOS, BILITOT, PROT, ALBUMIN in the last 72 hours. Recent Labs    08/25/20 0052 08/26/20 0110  WBC 9.0 9.3  HGB 11.1* 12.4*  HCT 35.5* 38.2*  MCV 78.7* 77.2*  PLT 155 189   No results for input(s): LABPROT, INR in the last 72 hours.    Assessment/Plan: -Iron deficiency anemia.  Chronic anemia no overt bleeding.  Hemoglobin 12.4 today. -A. fib with RVR.  Currently on apixaban. -Cardiomyopathy  Recommendations ------------------------ -Patient currently has no evidence of bleeding despite being on anticoagulation.  No melanotic stool.  History of chronic anemia.   - Discussed with cardiology team, I think it is okay to proceed with TEE without doing an upper endoscopy at this time. -Recommend to continue PPI for now. -No further inpatient GI work-up planned.  Recommend follow-up with Tony Roberts as an outpatient  for further work-up of chronic iron deficiency anemia. -GI will sign off.  Call us back if needed    Tony Brace MD, [facility] 08/26/2020, 10:34 AM  Contact #  [phone]

## 2020-08-27 ENCOUNTER — Inpatient Hospital Stay (HOSPITAL_COMMUNITY): Payer: Medicare Other | Admitting: *Deleted

## 2020-08-27 ENCOUNTER — Inpatient Hospital Stay (HOSPITAL_COMMUNITY): Payer: Medicare Other

## 2020-08-27 ENCOUNTER — Encounter (HOSPITAL_COMMUNITY): Payer: Self-pay | Admitting: Cardiology

## 2020-08-27 ENCOUNTER — Encounter (HOSPITAL_COMMUNITY)
Admission: EM | Disposition: A | Discharge status: Home / Self Care | Payer: Self-pay | Source: Home / Self Care | Attending: Internal Medicine

## 2020-08-27 ENCOUNTER — Telehealth: Payer: Self-pay

## 2020-08-27 DIAGNOSIS — I4891 Unspecified atrial fibrillation: Secondary | ICD-10-CM | POA: Diagnosis not present

## 2020-08-27 DIAGNOSIS — I340 Nonrheumatic mitral (valve) insufficiency: Secondary | ICD-10-CM

## 2020-08-27 DIAGNOSIS — I5021 Acute systolic (congestive) heart failure: Secondary | ICD-10-CM | POA: Diagnosis not present

## 2020-08-27 DIAGNOSIS — I351 Nonrheumatic aortic (valve) insufficiency: Secondary | ICD-10-CM | POA: Diagnosis not present

## 2020-08-27 DIAGNOSIS — I428 Other cardiomyopathies: Secondary | ICD-10-CM

## 2020-08-27 HISTORY — PX: TEE WITHOUT CARDIOVERSION: SHX5443

## 2020-08-27 HISTORY — PX: CARDIOVERSION: SHX1299

## 2020-08-27 LAB — BASIC METABOLIC PANEL
Anion gap: 10 (ref 5–15)
BUN: 20 mg/dL (ref 8–23)
CO2: 26 mmol/L (ref 22–32)
Calcium: 8.5 mg/dL — ABNORMAL LOW (ref 8.9–10.3)
Chloride: 101 mmol/L (ref 98–111)
Creatinine, Ser: 1.22 mg/dL (ref 0.61–1.24)
GFR, Estimated: >60 mL/min (ref >60)
Glucose, Bld: 114 mg/dL — ABNORMAL HIGH (ref 70–99)
Potassium: 3.5 mmol/L (ref 3.5–5.1)
Sodium: 137 mmol/L (ref 135–145)

## 2020-08-27 LAB — CBC
HCT: 37.6 % — ABNORMAL LOW (ref 39.0–52.0)
Hemoglobin: 11.7 g/dL — ABNORMAL LOW (ref 13.0–17.0)
MCH: 24.2 pg — ABNORMAL LOW (ref 26.0–34.0)
MCHC: 31.1 g/dL (ref 30.0–36.0)
MCV: 77.7 fL — ABNORMAL LOW (ref 80.0–100.0)
Platelets: 175 10*3/uL (ref 150–400)
RBC: 4.84 MIL/uL (ref 4.22–5.81)
RDW: 16 % — ABNORMAL HIGH (ref 11.5–15.5)
WBC: 8.5 10*3/uL (ref 4.0–10.5)
nRBC: 0 % (ref 0.0–0.2)

## 2020-08-27 SURGERY — CARDIOVERSION
Anesthesia: Monitor Anesthesia Care

## 2020-08-27 MED ORDER — PHENYLEPHRINE 40 MCG/ML (10ML) SYRINGE FOR IV PUSH (FOR BLOOD PRESSURE SUPPORT)
PREFILLED_SYRINGE | INTRAVENOUS | Status: DC | PRN
  Administered 2020-08-27: 120 ug via INTRAVENOUS

## 2020-08-27 MED ORDER — APIXABAN 5 MG PO TABS
5.0000 mg | ORAL_TABLET | Freq: Two times a day (BID) | ORAL | Status: DC
  Administered 2020-08-27 – 2020-08-28 (×3): 5 mg via ORAL
  Filled 2020-08-27 (×3): qty 1

## 2020-08-27 MED ORDER — PROPOFOL 500 MG/50ML IV EMUL
INTRAVENOUS | Status: DC | PRN
  Administered 2020-08-27: 100 ug/kg/min via INTRAVENOUS

## 2020-08-27 MED ORDER — PROPOFOL 10 MG/ML IV BOLUS
INTRAVENOUS | Status: DC | PRN
  Administered 2020-08-27: 50 mg via INTRAVENOUS

## 2020-08-27 MED ORDER — SPIRONOLACTONE 12.5 MG HALF TABLET
12.5000 mg | ORAL_TABLET | Freq: Every day | ORAL | Status: DC
  Administered 2020-08-27 – 2020-08-28 (×2): 12.5 mg via ORAL
  Filled 2020-08-27 (×2): qty 1

## 2020-08-27 MED ORDER — POTASSIUM CHLORIDE CRYS ER 20 MEQ PO TBCR
40.0000 meq | EXTENDED_RELEASE_TABLET | Freq: Once | ORAL | Status: AC
  Administered 2020-08-27: 40 meq via ORAL
  Filled 2020-08-27: qty 2

## 2020-08-27 MED ORDER — LIDOCAINE 2% (20 MG/ML) 5 ML SYRINGE
INTRAMUSCULAR | Status: DC | PRN
  Administered 2020-08-27: 100 mg via INTRAVENOUS

## 2020-08-27 NOTE — Transfer of Care (Signed)
Immediate Anesthesia Transfer of Care Note  Patient: CORNELUIS ALLSTON  Procedure(s) Performed: CARDIOVERSION (N/A ) TRANSESOPHAGEAL ECHOCARDIOGRAM (TEE) (N/A )  Patient Location: Endoscopy Unit  Anesthesia Type:MAC  Level of Consciousness: drowsy  Airway & Oxygen Therapy: Patient Spontanous Breathing and Patient connected to nasal cannula oxygen  Post-op Assessment: Report given to RN and Post -op Vital signs reviewed and stable  Post vital signs: Reviewed and stable  Last Vitals:  Vitals Value Taken Time  BP 90/58   Temp    Pulse 51   Resp 12   SpO2 99%     Last Pain:  Vitals:   08/27/20 0723  TempSrc: Oral  PainSc: 2       Patients Stated Pain Goal: 0 (45/85/92 9244)  Complications: No complications documented.

## 2020-08-27 NOTE — CV Procedure (Signed)
     Transesophageal Echocardiogram Note  TREON KEHL [id number] October 06, 1940  Procedure: Transesophageal Echocardiogram Indications: atrial fibrillation with RVR  Procedure Details Consent: Obtained Time Out: Verified patient identification, verified procedure, site/side was marked, verified correct patient position, special equipment/implants available, Radiology Safety Procedures followed,  medications/allergies/relevent history reviewed, required imaging and test results available.  Performed  Medications: During this procedure the patient is administered a total of Propofol 160 mg IV and Lidocaine 100 mg IV administered by anesthesia staff for deep sedation.  The patient's heart rate, blood pressure, and oxygen saturation are monitored continuously during the procedure. The period of conscious sedation is 35 minutes, of which I was present face-to-face 100% of this time.  Study Result  1. Left ventricular ejection fraction, by estimation, is 20 to 25%. The  left ventricle has severely decreased function. The left ventricle  demonstrates global hypokinesis. The left ventricular internal cavity size  was mildly dilated. Left ventricular  diastolic function could not be evaluated.  2. Right ventricular systolic function is normal. The right ventricular  size is normal.  3. Left atrial size was severely dilated. No left atrial/left atrial  appendage thrombus was detected.  4. Right atrial size was severely dilated.  5. The mitral valve is degenerative. Mild to moderate mitral valve  regurgitation. No evidence of mitral stenosis.  6. The aortic valve is tricuspid. Aortic valve regurgitation is moderate.  No aortic stenosis is present.  7. The inferior vena cava is normal in size with greater than 50%  respiratory variability, suggesting right atrial pressure of 3 mmHg.  8. Evidence of atrial level shunting detected by color flow Doppler.  There is a small patent foramen  ovale with predominantly left to right  shunting across the atrial septum.   Conclusion(s)/Recommendation(s): No LA/LAA thrombus identified. Successful  cardioversion performed with restoration of normal sinus rhythm.     Complications: No apparent complications Patient did tolerate procedure well.  Ena Dawley, MD, [facility] 08/27/2020, 9:06 AM        Cardioversion Note  IZACC DEMEYER [id number] [birth year]  Procedure: DC Cardioversion Indications: atrial fibrillation with RVR  Procedure Details Consent: Obtained Time Out: Verified patient identification, verified procedure, site/side was marked, verified correct patient position, special equipment/implants available, Radiology Safety Procedures followed,  medications/allergies/relevent history reviewed, required imaging and test results available.  Performed  The patient has been on adequate anticoagulation.  The patient received Propofol 160 mg IV and Lidocaine 100 mg IV administered by anesthesia staff for deep sedation.  Synchronous cardioversion was performed at 200 joules.  The cardioversion was successful.   Complications: No apparent complications Patient did tolerate procedure well.   Ena Dawley, MD, [facility] 08/27/2020, 9:06 AM

## 2020-08-27 NOTE — Anesthesia Postprocedure Evaluation (Signed)
Anesthesia Post Note  Patient: SHEAMUS HASTING  Procedure(s) Performed: CARDIOVERSION (N/A ) TRANSESOPHAGEAL ECHOCARDIOGRAM (TEE) (N/A )     Patient location during evaluation: Endoscopy Anesthesia Type: General Level of consciousness: awake and alert Pain management: pain level controlled Vital Signs Assessment: post-procedure vital signs reviewed and stable Respiratory status: spontaneous breathing, nonlabored ventilation, respiratory function stable and patient connected to nasal cannula oxygen Cardiovascular status: blood pressure returned to baseline and stable Postop Assessment: no apparent nausea or vomiting Anesthetic complications: no   No complications documented.  Last Vitals:  Vitals:   08/27/20 0902 08/27/20 0920  BP: 104/67 117/76  Pulse: (!) 39   Resp: 20 18  Temp:  36.4 C  SpO2: 95% 95%    Last Pain:  Vitals:   08/27/20 0920  TempSrc: Oral  PainSc: 0-No pain   Pain Goal: Patients Stated Pain Goal: 0 (08/24/20 0902)                 Belenda Cruise P Keilan Nichol

## 2020-08-27 NOTE — Anesthesia Preprocedure Evaluation (Addendum)
Anesthesia Evaluation  Patient identified by MRN, date of birth, ID band Patient awake    Reviewed: Patient's Chart, lab work & pertinent test results  Airway Mallampati: II  TM Distance: >3 FB Neck ROM: Full    Dental  (+) Edentulous Upper, Edentulous Lower   Pulmonary asthma ,    Pulmonary exam normal        Cardiovascular + dysrhythmias Atrial Fibrillation  Rhythm:Irregular Rate:Tachycardia     Neuro/Psych  Headaches, negative psych ROS   GI/Hepatic Neg liver ROS, GERD  Medicated,  Endo/Other  negative endocrine ROS  Renal/GU negative Renal ROS  negative genitourinary   Musculoskeletal  (+) Arthritis ,   Abdominal (+)  Abdomen: soft. Bowel sounds: normal.  Peds  Hematology  (+) anemia ,   Anesthesia Other Findings   Reproductive/Obstetrics                             Anesthesia Physical Anesthesia Plan  ASA: IV  Anesthesia Plan: General   Post-op Pain Management:    Induction: Intravenous  PONV Risk Score and Plan: 2 and Propofol infusion  Airway Management Planned: Simple Face Mask, Natural Airway, Nasal Cannula and Mask  Additional Equipment: None  Intra-op Plan:   Post-operative Plan:   Informed Consent: I have reviewed the patients History and Physical, chart, labs and discussed the procedure including the risks, benefits and alternatives for the proposed anesthesia with the patient or authorized representative who has indicated his/her understanding and acceptance.     Dental advisory given  Plan Discussed with: CRNA  Anesthesia Plan Comments: (Lab Results      Component                Value               Date                      WBC                      8.5                 08/27/2020                HGB                      11.7 (L)            08/27/2020                HCT                      37.6 (L)            08/27/2020                MCV                       77.7 (L)            08/27/2020                PLT                      175                 08/27/2020           ECHO  08/22/20: 1. Left ventricular ejection fraction, by estimation, is 25 to 30%. The  left ventricle has severely decreased function. The left ventricle  demonstrates global hypokinesis. There is moderate left ventricular  hypertrophy of the basal-septal segment. Left  ventricular diastolic function could not be evaluated.  2. Right ventricular systolic function is normal. The right ventricular  size is normal. Tricuspid regurgitation signal is inadequate for assessing  PA pressure.  3. Left atrial size was severely dilated.  4. Right atrial size was mildly dilated.  5. The mitral valve is normal in structure. Mild mitral valve  regurgitation. No evidence of mitral stenosis.  6. The aortic valve is tricuspid. Aortic valve regurgitation is mild.  Mild aortic valve sclerosis is present, with no evidence of aortic valve  stenosis.  7. Aortic dilatation noted. There is mild dilatation of the ascending  aorta, measuring 40 mm.  8. The inferior vena cava is normal in size with greater than 50%  respiratory variability, suggesting right atrial pressure of 3 mmHg. )       Anesthesia Quick Evaluation

## 2020-08-27 NOTE — Plan of Care (Signed)

## 2020-08-27 NOTE — Progress Notes (Signed)
Progress Note  Patient Name: Tony Roberts Date of Encounter: 08/27/2020  [facility] Cardiologist: Dr Radford Pax  Subjective   Pt denies CP; minimal dyspnea  Inpatient Medications    Scheduled Meds: . apixaban  5 mg Oral BID  . famotidine  40 mg Oral QHS  . ferrous sulfate  325 mg Oral BID WC  . fluticasone furoate-vilanterol  1 puff Inhalation Daily  . furosemide  40 mg Intravenous Daily  . metoprolol tartrate  75 mg Oral BID  . montelukast  10 mg Oral QHS  . pantoprazole  40 mg Oral Daily  . sodium chloride flush  3 mL Intravenous Q12H   Continuous Infusions: . sodium chloride    . sodium chloride 20 mL/hr at 08/26/20 1816  . amiodarone 30 mg/hr (08/27/20 0300)  . lactated ringers 10 mL/hr at 08/26/20 1800   PRN Meds: sodium chloride, acetaminophen, levalbuterol, morphine injection, ondansetron (ZOFRAN) IV, sodium chloride flush, traMADol, triamcinolone   Vital Signs    Vitals:   08/26/20 2300 08/27/20 0000 08/27/20 0300 08/27/20 0435  BP:  108/76  (!) 119/94  Pulse: (!) 136 (!) 123 (!) 132 (!) 128  Resp: 16 17 15 19   Temp:  98.5 F (36.9 C)  98.6 F (37 C)  TempSrc:  Oral  Oral  SpO2: 95% 97% 91% 97%  Weight:    83.9 kg  Height:        Intake/Output Summary (Last 24 hours) at 08/27/2020 0715 Last data filed at 08/27/2020 0400 Gross per 24 hour  Intake 728.86 ml  Output 600 ml  Net 128.86 ml   Last 3 Weights 08/27/2020 08/26/2020 08/25/2020  Weight (lbs) 184 lb 15.5 oz 184 lb 11.9 oz 187 lb 1.6 oz  Weight (kg) 83.9 kg 83.8 kg 84.868 kg      Telemetry    Atrial fibrillation with RVR; rare PVC or aberrantly conducted beat- Personally Reviewed   Physical Exam   GEN: No acute distress. WD WN  Neck: supple Cardiac: Irregular and tachycardic, no murmur Respiratory: CTA GI: Soft, NT/ND MS: No edema; LUE in sling Neuro: Grossly  intact Psych: Normal affect   Labs    High Sensitivity Troponin:   Recent Labs  Lab 08/21/20 1105 08/21/20 1343   TROPONINIHS 10 10      Chemistry Recent Labs  Lab 08/21/20 1105 08/22/20 0104 08/25/20 0052 08/26/20 0110 08/27/20 0034  NA 139   < > 135 137 137  K 3.4*   < > 3.6 3.1* 3.5  CL 105   < > 102 101 101  CO2 21*   < > 24 25 26   GLUCOSE 135*   < > 103* 108* 114*  BUN 10   < > 21 23 20   CREATININE 1.10   < > 1.17 1.31* 1.22  CALCIUM 8.8*   < > 8.4* 8.6* 8.5*  PROT 6.3*  --   --   --   --   ALBUMIN 3.5  --   --   --   --   AST 26  --   --   --   --   ALT 20  --   --   --   --   ALKPHOS 64  --   --   --   --   BILITOT 1.4*  --   --   --   --   GFRNONAA >60   < > >60 55* >60  ANIONGAP 13   < > 9  11 10   < > = values in this interval not displayed.     Hematology Recent Labs  Lab 08/25/20 0052 08/26/20 0110 08/27/20 0034  WBC 9.0 9.3 8.5  RBC 4.51 4.95 4.84  HGB 11.1* 12.4* 11.7*  HCT 35.5* 38.2* 37.6*  MCV 78.7* 77.2* 77.7*  MCH 24.6* 25.1* 24.2*  MCHC 31.3 32.5 31.1  RDW 16.3* 16.2* 16.0*  PLT 155 189 175    BNP Recent Labs  Lab 08/21/20 1127  BNP 328.4*    Patient Profile     79 y.o. male with past medical history of hypertension, Gastrosoft reflux disease with atrial fibrillation with rapid ventricular response.  Echocardiogram shows ejection fraction 25 to 30%, moderate left ventricular hypertrophy, severe left atrial enlargement, mild right atrial enlargement, mild mitral regurgitation, mild aortic insufficiency and mildly dilated ascending aorta.  Also with anemia and shoulder injury.  Assessment & Plan    1 atrial fibrillation with rapid ventricular response-patient's heart rate remains elevated today.  Continue amiodarone and metoprolol at present dose.  Continue apixaban.  For TEE guided cardioversion today.   2 cardiomyopathy-felt to be possibly tachycardia mediated.  Blood pressure remains borderline.  Plan to change metoprolol to Toprol at discharge.  Add ARB or Entresto following TEE guided cardioversion if blood pressure allows.  Repeat  echocardiogram in 3 months.  If LV function has not improved once heart rate controlled would need ischemia evaluation.    3 nonsustained ventricular tachycardia-previously noted on telemetry.  Continue beta-blocker and amiodarone.  4 anemia-gastroenterology has decided not to pursue EGD as an inpatient.  Will need follow-up as an outpatient.  Continue Protonix.  5 acute systolic congestive heart failure-likely exacerbated by atrial fibrillation.  Continue Lasix.  Add spironolactone 12.5 mg daily.  Follow potassium and renal function.  6 shoulder dislocation-Per orthopedics.  7 possible history of TIAs-continue apixaban given newly diagnosed atrial fibrillation.  8 hypokalemia-supplement.  For questions or updates, please contact [place] Please consult [email or web address] for contact info under        Signed, Kirk Ruths, MD  08/27/2020, 7:15 AM

## 2020-08-27 NOTE — Progress Notes (Signed)
Progress Note    Tony Roberts  NFA:[id number] DOB: 08/05/1941  DOA: 08/21/2020 PCP: Alroy Dust, L.Marlou Sa, MD    Brief Narrative:     Medical records reviewed and are as summarized below:  Tony Roberts is an 79 y.o. male with medical history significant ofHTN, GERD, asthma, diverticulosis, presented with new onset of palpitations.  On 12/3 pt got out of bed and fell onto left shoulder. experienced immediate pain and deformity. xrays showed dislocated left shoulder. Ortho Consulted went for surgery.  Patient was cardioverted on 12/7 with success into sinus rhythm.    Assessment/Plan:   Active Problems:   Atrial fibrillation with rapid ventricular response (HCC)   Traumatic closed displaced fracture of left shoulder with anterior dislocation    New onset A. fib with RVR Cardiology following-stoped cardizem due to LV dysfunction Continue on amidarone gtt per cards 12/4: Elqiuis 5mg  bid as CHADS2VASC score 5 -s/p TEE/cardioversion on 12/7 -replete K to 4  Cardiomyopathy-LVEF 25-30% found on echo on 08/22/2020 Possibly due to afib rvr v.s. primary causing afib rvr.  Cardiology following- lasix per cards Will need to add Entresto and eventually Aldactone I's and O's  Daily weight   S/p fall with left shoulder dislocation- Status post closed reduction by Dr. Erlinda Hong on 12/3 Continue arm sling -MRI:  1.Complete tear of the supraspinatus and infraspinatus tendons with 3.4 cm of retraction. 2. High-grade partial-thickness versus complete tear of the subscapularis tendon. 3. Muscle edema in the posterior deltoid muscle most concerning for muscle strain. 4. Muscle edema in the supraspinatus and infraspinatus muscles most consistent with muscle strain with perifascial edema. 5. Muscle edema in the subscapularis muscle consistent with muscle strain. 6. Attenuation of the humeral attachment of the inferior glenohumeral ligament concerning for injury without  complete disruption. 7. Osseous contusion of the superior posterior humeral head as can be seen with a Hill-Sachs lesion as a result of the sequela of anterior shoulder dislocation. -per ortho, can not lay on left side  Hypokalemia -replete as needed to 4  TIAs -recent -MRI -2 weeks ago. Likely 2/2 afib LDL goal <70 -eliquis started 12/4  Chronic normocytic-microcytic anemia -was to undergo a GI work up with EGD prior to starting eliquis but this did not happen as he instead had a fall and shoulder dislocation.  S/p surgery -Hgb stable on eliquis -Per GI: No further inpatient GI work-up planned.  Recommend follow-up with Dr. Cristina Gong as an outpatient for further work-up of chronic iron deficiency anemia  Asthma,mild intermittent Continue inhalers  obesity Body mass index is 29.7 kg/m.   Family Communication/Anticipated D/C date and plan/Code Status   DVT prophylaxis: elquis Code Status: Full Code.  Disposition Plan: Status is: Inpatient  Remains inpatient appropriate because:Inpatient level of care appropriate due to severity of illness   Dispo: The patient is from: Home              Anticipated d/c is to: Home              Anticipated d/c date is: 1-2 days              Patient currently is not medically stable to d/c.- home in AM if ok with cards-- s/p cardioversion on 12/7         Medical Consultants:    Cards  Ortho  GI: No further inpatient GI work-up planned.  Recommend follow-up with Dr. Cristina Gong as an outpatient for further work-up of chronic iron deficiency anemia  Subjective:   Says he is already feeling better s/p cardioversion  Objective:    Vitals:   08/27/20 0855 08/27/20 0900 08/27/20 0902 08/27/20 0920  BP: 90/60 90/60 104/67 117/76  Pulse: (!) 38 (!) 50 (!) 39   Resp: 20 18 20 18   Temp:    97.6 F (36.4 C)  TempSrc:    Oral  SpO2: 92% 98% 95% 95%  Weight:      Height:        Intake/Output Summary (Last 24 hours)  at 08/27/2020 1012 Last data filed at 08/27/2020 0539 Gross per 24 hour  Intake 1422.86 ml  Output 600 ml  Net 822.86 ml   Filed Weights   08/26/20 0400 08/27/20 0435 08/27/20 0723  Weight: 83.8 kg 83.9 kg 83.5 kg    Exam:  General: Appearance:     Overweight male in no acute distress     Lungs:      respirations unlabored  Heart:    Bradycardic. sinus No murmurs, rubs, or gallops.   MS:   All extremities are intact.   Neurologic:   Awake, alert, oriented x 3. No apparent focal neurological           defect.       Data Reviewed:   I have personally reviewed following labs and imaging studies:  Labs: Labs show the following:   Basic Metabolic Panel: Recent Labs  Lab 08/21/20 1629 08/22/20 0104 08/23/20 0228 08/23/20 0228 08/24/20 0327 08/24/20 0327 08/25/20 0052 08/25/20 0052 08/26/20 0110 08/27/20 0034  NA  --    < > 136  --  137  --  135  --  137 137  K  --    < > 3.7   < > 3.6   < > 3.6   < > 3.1* 3.5  CL  --    < > 104  --  103  --  102  --  101 101  CO2  --    < > 21*  --  20*  --  24  --  25 26  GLUCOSE  --    < > 152*  --  117*  --  103*  --  108* 114*  BUN  --    < > 19  --  18  --  21  --  23 20  CREATININE  --    < > 1.13  --  0.98  --  1.17  --  1.31* 1.22  CALCIUM  --    < > 8.9  --  8.8*  --  8.4*  --  8.6* 8.5*  MG 2.1  --   --   --   --   --   --   --  2.0  --   PHOS 3.3  --   --   --   --   --   --   --   --   --    < > = values in this interval not displayed.   GFR Estimated Creatinine Clearance: 49.8 mL/min (by C-G formula based on SCr of 1.22 mg/dL). Liver Function Tests: Recent Labs  Lab 08/21/20 1105  AST 26  ALT 20  ALKPHOS 64  BILITOT 1.4*  PROT 6.3*  ALBUMIN 3.5   No results for input(s): LIPASE, AMYLASE in the last 168 hours. No results for input(s): AMMONIA in the last 168 hours. Coagulation profile No results for input(s): INR, PROTIME in the last 168 hours.  CBC: Recent Labs  Lab 08/21/20 1105 08/22/20 0104  08/23/20 0228 08/24/20 0327 08/25/20 0052 08/26/20 0110 08/27/20 0034  WBC 7.2   < > 11.4* 8.4 9.0 9.3 8.5  NEUTROABS 5.3  --   --   --   --   --   --   HGB 11.6*   < > 10.8* 10.7* 11.1* 12.4* 11.7*  HCT 38.1*   < > 34.6* 34.6* 35.5* 38.2* 37.6*  MCV 80.4   < > 78.5* 78.8* 78.7* 77.2* 77.7*  PLT 152   < > 158 140* 155 189 175   < > = values in this interval not displayed.   Cardiac Enzymes: No results for input(s): CKTOTAL, CKMB, CKMBINDEX, TROPONINI in the last 168 hours. BNP (last 3 results) No results for input(s): PROBNP in the last 8760 hours. CBG: No results for input(s): GLUCAP in the last 168 hours. D-Dimer: No results for input(s): DDIMER in the last 72 hours. Hgb A1c: No results for input(s): HGBA1C in the last 72 hours. Lipid Profile: No results for input(s): CHOL, HDL, LDLCALC, TRIG, CHOLHDL, LDLDIRECT in the last 72 hours. Thyroid function studies: No results for input(s): TSH, T4TOTAL, T3FREE, THYROIDAB in the last 72 hours.  Invalid input(s): FREET3 Anemia work up: No results for input(s): VITAMINB12, FOLATE, FERRITIN, TIBC, IRON, RETICCTPCT in the last 72 hours. Sepsis Labs: Recent Labs  Lab 08/24/20 0327 08/25/20 0052 08/26/20 0110 08/27/20 0034  WBC 8.4 9.0 9.3 8.5    Microbiology Recent Results (from the past 240 hour(s))  Resp Panel by RT-PCR (Flu A&B, Covid) Nasopharyngeal Swab     Status: None   Collection Time: 08/21/20  2:18 PM   Specimen: Nasopharyngeal Swab; Nasopharyngeal(NP) swabs in vial transport medium  Result Value Ref Range Status   SARS Coronavirus 2 by RT PCR NEGATIVE NEGATIVE Final    Comment: (NOTE) SARS-CoV-2 target nucleic acids are NOT DETECTED.  The SARS-CoV-2 RNA is generally detectable in upper respiratory specimens during the acute phase of infection. The lowest concentration of SARS-CoV-2 viral copies this assay can detect is 138 copies/mL. A negative result does not preclude SARS-Cov-2 infection and should not be  used as the sole basis for treatment or other patient management decisions. A negative result may occur with  improper specimen collection/handling, submission of specimen other than nasopharyngeal swab, presence of viral mutation(s) within the areas targeted by this assay, and inadequate number of viral copies(<138 copies/mL). A negative result must be combined with clinical observations, patient history, and epidemiological information. The expected result is Negative.  Fact Sheet for Patients:  EntrepreneurPulse.com.au  Fact Sheet for Healthcare Providers:  IncredibleEmployment.be  This test is no t yet approved or cleared by the [place] FDA and  has been authorized for detection and/or diagnosis of SARS-CoV-2 by FDA under an Emergency Use Authorization (EUA). This EUA will remain  in effect (meaning this test can be used) for the duration of the COVID-19 declaration under Section 564(b)(1) of the Act, 21 U.S.C.section 360bbb-3(b)(1), unless the authorization is terminated  or revoked sooner.       Influenza A by PCR NEGATIVE NEGATIVE Final   Influenza B by PCR NEGATIVE NEGATIVE Final    Comment: (NOTE) The Xpert Xpress SARS-CoV-2/FLU/RSV plus assay is intended as an aid in the diagnosis of influenza from Nasopharyngeal swab specimens and should not be used as a sole basis for treatment. Nasal washings and aspirates are unacceptable for Xpert Xpress SARS-CoV-2/FLU/RSV testing.  Fact Sheet for Patients: EntrepreneurPulse.com.au  Fact Sheet for Healthcare Providers: IncredibleEmployment.be  This test is not yet approved or cleared by the [place] FDA and has been authorized for detection and/or diagnosis of SARS-CoV-2 by FDA under an Emergency Use Authorization (EUA). This EUA will remain in effect (meaning this test can be used) for the duration of the COVID-19 declaration under Section  564(b)(1) of the Act, 21 U.S.C. section 360bbb-3(b)(1), unless the authorization is terminated or revoked.  Performed at [facility], [place] [address] [place], [place] [address]     Procedures and diagnostic studies:  ECHO TEE  Result Date: September 03, 2020    TRANSESOPHOGEAL ECHO REPORT   Patient Name:   Tony Roberts [place] Date of Exam: 09/03/2020 Medical Rec #:  [id number]      Height:       66.0 in Accession #:    [id number]     Weight:       184.0 lb Date of Birth:  1940-10-22      BSA:          1.930 m Patient Age:    72 years       BP:           182/89 mmHg Patient Gender: M              HR:           131 bpm. Exam Location:  Inpatient Procedure: Transesophageal Echo, Cardiac Doppler and Color Doppler Indications:     I48.91* Unspeicified atrial fibrillation  History:         Patient has prior history of Echocardiogram examinations, most                  recent 08/22/2020. Abnormal ECG; Arrythmias:Atrial Fibrillation.  Sonographer:     Roseanna Rainbow RDCS Referring Phys:  [place] Diagnosing Phys: Ena Dawley MD PROCEDURE: After discussion of the risks and benefits of a TEE, an informed consent was obtained from the patient. The transesophogeal probe was passed without difficulty through the esophogus of the patient. Imaged were obtained with the patient in a supine position. Sedation performed by different physician. The patient was monitored while under deep sedation. Anesthestetic sedation was provided intravenously by Anesthesiology: 160mg  of Propofol, 100mg  of Lidocaine. The patient's vital signs; including heart rate, blood pressure, and oxygen saturation; remained stable throughout the procedure. The patient developed no complications during the procedure. A successful direct current cardioversion was performed at 200 joules with 1 attempt. IMPRESSIONS  1. Left ventricular ejection fraction, by estimation, is 20 to 25%. The left ventricle has severely decreased function. The left  ventricle demonstrates global hypokinesis. The left ventricular internal cavity size was mildly dilated. Left ventricular diastolic function could not be evaluated.  2. Right ventricular systolic function is normal. The right ventricular size is normal.  3. Left atrial size was severely dilated. No left atrial/left atrial appendage thrombus was detected.  4. Right atrial size was severely dilated.  5. The mitral valve is degenerative. Mild to moderate mitral valve regurgitation. No evidence of mitral stenosis.  6. The aortic valve is tricuspid. Aortic valve regurgitation is moderate. No aortic stenosis is present.  7. The inferior vena cava is normal in size with greater than 50% respiratory variability, suggesting right atrial pressure of 3 mmHg.  8. Evidence of atrial level shunting detected by color flow Doppler. There is a small patent foramen ovale with predominantly left to right shunting across the atrial septum. Conclusion(s)/Recommendation(s): No LA/LAA thrombus  identified. Successful cardioversion performed with restoration of normal sinus rhythm. FINDINGS  Left Ventricle: Left ventricular ejection fraction, by estimation, is 20 to 25%. The left ventricle has severely decreased function. The left ventricle demonstrates global hypokinesis. The left ventricular internal cavity size was mildly dilated. There is no left ventricular hypertrophy. Left ventricular diastolic function could not be evaluated. Right Ventricle: The right ventricular size is normal. No increase in right ventricular wall thickness. Right ventricular systolic function is normal. Left Atrium: Left atrial size was severely dilated. No left atrial/left atrial appendage thrombus was detected. Right Atrium: Right atrial size was severely dilated. Pericardium: There is no evidence of pericardial effusion. Mitral Valve: The mitral valve is degenerative in appearance. Mild to moderate mitral valve regurgitation. No evidence of mitral valve  stenosis. Tricuspid Valve: The tricuspid valve is normal in structure. Tricuspid valve regurgitation is mild . No evidence of tricuspid stenosis. Aortic Valve: The aortic valve is tricuspid. Aortic valve regurgitation is moderate. No aortic stenosis is present. Pulmonic Valve: The pulmonic valve was normal in structure. Pulmonic valve regurgitation is not visualized. No evidence of pulmonic stenosis. Aorta: The aortic root is normal in size and structure. Venous: The inferior vena cava is normal in size with greater than 50% respiratory variability, suggesting right atrial pressure of 3 mmHg. IAS/Shunts: Evidence of atrial level shunting detected by color flow Doppler. A small patent foramen ovale is detected with predominantly left to right shunting across the atrial septum.  LEFT VENTRICLE PLAX 2D LVOT diam:     2.10 cm LVOT Area:     3.46 cm   SHUNTS Systemic Diam: 2.10 cm Ena Dawley MD Electronically signed by Ena Dawley MD Signature Date/Time: 08/27/2020/9:05:49 AM    Final     Medications:   . apixaban  5 mg Oral BID  . famotidine  40 mg Oral QHS  . ferrous sulfate  325 mg Oral BID WC  . fluticasone furoate-vilanterol  1 puff Inhalation Daily  . furosemide  40 mg Intravenous Daily  . metoprolol tartrate  75 mg Oral BID  . montelukast  10 mg Oral QHS  . pantoprazole  40 mg Oral Daily  . sodium chloride flush  3 mL Intravenous Q12H  . spironolactone  12.5 mg Oral Daily   Continuous Infusions: . sodium chloride    . amiodarone Stopped (08/27/20 5784)  . lactated ringers 10 mL/hr at 08/26/20 1800     LOS: 6 days   Geradine Girt  [facility]   How to contact the [facility] Attending or Consulting provider [place] or covering provider during after hours [place], for this patient?  1. Check the care team in [facility] and look for a) attending/consulting TRH provider listed and b) the [facility] team listed 2. Log into [email or web address] and use [place]'s universal password to access. If you  do not have the password, please contact the hospital operator. 3. Locate the [facility] provider you are looking for under [facility] and page to a number that you can be directly reached. 4. If you still have difficulty reaching the provider, please page the [facility] (Director on Call) for the Hospitalists listed on amion for assistance.  08/27/2020, 10:12 AM

## 2020-08-27 NOTE — Telephone Encounter (Signed)
Patient called in to inform Dr. Neldon Mc that he was being discharged from hospital tomorrow after his visit here recently. He said that Dr. Neldon Mc noticed an issue with his heart rate and thought maybe it was afib. Rice said that it was in fact afib and he was getting the treatment he needed at the hospital. He says that Dr. Neldon Mc saved his life again it seems. He also said thank you as well.

## 2020-08-27 NOTE — Progress Notes (Signed)
Heart Failure Stewardship Pharmacist Progress Note   PCP: Alroy Dust, L.Marlou Sa, MD PCP-Cardiologist: No primary care provider on file.    HPI:  79 yo M with PMH of HTN and GERD. He presented to the ED on 08/21/20 with DOE, increased cough, and palpitations. He was found to be in afib with RVR. An ECHO was done on 08/22/20 and LVEF is 25-30%. TEE/DCCV was successful on 08/27/20.  Current HF Medications: Furosemide 40 mg IV daily Metoprolol tartrate 75 mg BID Spironolactone 12.5 mg daily  Prior to admission HF Medications: None  Pertinent Lab Values: . Serum creatinine 1.22, BUN 20, Potassium 3.5, Sodium 137, BNP 328, Magnesium 2.0  Vital Signs: . Weight: 184 lbs (admission weight: 187 lbs) . Blood pressure: 117/76 following DCCV . Heart rate: 50s following DCCV  Medication Assistance / Insurance Benefits Check: Does the patient have prescription insurance?  Yes Type of insurance plan: UHC Medicare  Does the patient qualify for medication assistance through manufacturers or grants?   Pending household income information . Eligible grants and/or patient assistance programs: TBD . Medication assistance applications in progress: none  . Medication assistance applications approved: none Approved medication assistance renewals will be completed by: [place]:  Prior to admission outpatient pharmacy: [facility] outpatient pharmacy Is the patient willing to use [place] at discharge? Yes Is the patient willing to transition their outpatient pharmacy to utilize a [facility] outpatient pharmacy?   Yes    Assessment: 1. Acute systolic CHF (EF 16-10%), likely due to afib with RVR. NYHA class II symptoms. - Continue furosemide 40 mg IV. Potassium replaced (goal >4.0) - Metoprolol 75 mg BID. Consider transitioning to XL once max-tolerated dose achieved prior to discharge - Consider starting ARB/Entresto given new low EF following TEE/DCCV - Agree with starting spironolactone  12.5 mg daily - Consider starting Farxiga pending BP and SCr trends - Not a candidate for ivabradine for further HR reduction given concurrent afib - Agree with stopping diltiazem given LV dysfunction   Plan: 1) Medication changes recommended at this time: - Agree with starting spironolactone 12.5 mg daily  2) Patient assistance application(s): - Entresto copay $47 per month - Farxiga copay $47 per month Can help enroll patient in patient assistance with drug manufacturers to bring copay down to $0 per fill for both Delene Loll and Farxiga  3)  Education  - To be completed prior to discharge  Kerby Nora, PharmD, BCPS Heart Failure Cytogeneticist Phone [phone]

## 2020-08-27 NOTE — H&P (View-Only) (Signed)
Progress Note  Patient Name: Tony Roberts Date of Encounter: 08/27/2020  [facility] Cardiologist: Dr Radford Pax  Subjective   Pt denies CP; minimal dyspnea  Inpatient Medications    Scheduled Meds: . apixaban  5 mg Oral BID  . famotidine  40 mg Oral QHS  . ferrous sulfate  325 mg Oral BID WC  . fluticasone furoate-vilanterol  1 puff Inhalation Daily  . furosemide  40 mg Intravenous Daily  . metoprolol tartrate  75 mg Oral BID  . montelukast  10 mg Oral QHS  . pantoprazole  40 mg Oral Daily  . sodium chloride flush  3 mL Intravenous Q12H   Continuous Infusions: . sodium chloride    . sodium chloride 20 mL/hr at 08/26/20 1816  . amiodarone 30 mg/hr (08/27/20 0300)  . lactated ringers 10 mL/hr at 08/26/20 1800   PRN Meds: sodium chloride, acetaminophen, levalbuterol, morphine injection, ondansetron (ZOFRAN) IV, sodium chloride flush, traMADol, triamcinolone   Vital Signs    Vitals:   08/26/20 2300 08/27/20 0000 08/27/20 0300 08/27/20 0435  BP:  108/76  (!) 119/94  Pulse: (!) 136 (!) 123 (!) 132 (!) 128  Resp: 16 17 15 19   Temp:  98.5 F (36.9 C)  98.6 F (37 C)  TempSrc:  Oral  Oral  SpO2: 95% 97% 91% 97%  Weight:    83.9 kg  Height:        Intake/Output Summary (Last 24 hours) at 08/27/2020 0715 Last data filed at 08/27/2020 0400 Gross per 24 hour  Intake 728.86 ml  Output 600 ml  Net 128.86 ml   Last 3 Weights 08/27/2020 08/26/2020 08/25/2020  Weight (lbs) 184 lb 15.5 oz 184 lb 11.9 oz 187 lb 1.6 oz  Weight (kg) 83.9 kg 83.8 kg 84.868 kg      Telemetry    Atrial fibrillation with RVR; rare PVC or aberrantly conducted beat- Personally Reviewed   Physical Exam   GEN: No acute distress. WD WN  Neck: supple Cardiac: Irregular and tachycardic, no murmur Respiratory: CTA GI: Soft, NT/ND MS: No edema; LUE in sling Neuro: Grossly  intact Psych: Normal affect   Labs    High Sensitivity Troponin:   Recent Labs  Lab 08/21/20 1105 08/21/20 1343   TROPONINIHS 10 10      Chemistry Recent Labs  Lab 08/21/20 1105 08/22/20 0104 08/25/20 0052 08/26/20 0110 08/27/20 0034  NA 139   < > 135 137 137  K 3.4*   < > 3.6 3.1* 3.5  CL 105   < > 102 101 101  CO2 21*   < > 24 25 26   GLUCOSE 135*   < > 103* 108* 114*  BUN 10   < > 21 23 20   CREATININE 1.10   < > 1.17 1.31* 1.22  CALCIUM 8.8*   < > 8.4* 8.6* 8.5*  PROT 6.3*  --   --   --   --   ALBUMIN 3.5  --   --   --   --   AST 26  --   --   --   --   ALT 20  --   --   --   --   ALKPHOS 64  --   --   --   --   BILITOT 1.4*  --   --   --   --   GFRNONAA >60   < > >60 55* >60  ANIONGAP 13   < > 9  11 10   < > = values in this interval not displayed.     Hematology Recent Labs  Lab 08/25/20 0052 08/26/20 0110 08/27/20 0034  WBC 9.0 9.3 8.5  RBC 4.51 4.95 4.84  HGB 11.1* 12.4* 11.7*  HCT 35.5* 38.2* 37.6*  MCV 78.7* 77.2* 77.7*  MCH 24.6* 25.1* 24.2*  MCHC 31.3 32.5 31.1  RDW 16.3* 16.2* 16.0*  PLT 155 189 175    BNP Recent Labs  Lab 08/21/20 1127  BNP 328.4*    Patient Profile     79 y.o. male with past medical history of hypertension, Gastrosoft reflux disease with atrial fibrillation with rapid ventricular response.  Echocardiogram shows ejection fraction 25 to 30%, moderate left ventricular hypertrophy, severe left atrial enlargement, mild right atrial enlargement, mild mitral regurgitation, mild aortic insufficiency and mildly dilated ascending aorta.  Also with anemia and shoulder injury.  Assessment & Plan    1 atrial fibrillation with rapid ventricular response-patient's heart rate remains elevated today.  Continue amiodarone and metoprolol at present dose.  Continue apixaban.  For TEE guided cardioversion today.   2 cardiomyopathy-felt to be possibly tachycardia mediated.  Blood pressure remains borderline.  Plan to change metoprolol to Toprol at discharge.  Add ARB or Entresto following TEE guided cardioversion if blood pressure allows.  Repeat  echocardiogram in 3 months.  If LV function has not improved once heart rate controlled would need ischemia evaluation.    3 nonsustained ventricular tachycardia-previously noted on telemetry.  Continue beta-blocker and amiodarone.  4 anemia-gastroenterology has decided not to pursue EGD as an inpatient.  Will need follow-up as an outpatient.  Continue Protonix.  5 acute systolic congestive heart failure-likely exacerbated by atrial fibrillation.  Continue Lasix.  Add spironolactone 12.5 mg daily.  Follow potassium and renal function.  6 shoulder dislocation-Per orthopedics.  7 possible history of TIAs-continue apixaban given newly diagnosed atrial fibrillation.  8 hypokalemia-supplement.  For questions or updates, please contact [place] Please consult [email or web address] for contact info under        Signed, Kirk Ruths, MD  08/27/2020, 7:15 AM

## 2020-08-27 NOTE — Interval H&P Note (Signed)
History and Physical Interval Note:  08/27/2020 7:45 AM  Tony Roberts  has presented today for surgery, with the diagnosis of AFIB.  The various methods of treatment have been discussed with the patient and family. After consideration of risks, benefits and other options for treatment, the patient has consented to  Procedure(s): CARDIOVERSION (N/A) TRANSESOPHAGEAL ECHOCARDIOGRAM (TEE) (N/A) as a surgical intervention.  The patient's history has been reviewed, patient examined, no change in status, stable for surgery.  I have reviewed the patient's chart and labs.  Questions were answered to the patient's satisfaction.     Ena Dawley

## 2020-08-27 NOTE — Progress Notes (Signed)
  Echocardiogram 2D Echocardiogram has been performed.  Tony Roberts 08/27/2020, 8:38 AM

## 2020-08-28 ENCOUNTER — Telehealth: Payer: Self-pay | Admitting: Cardiology

## 2020-08-28 ENCOUNTER — Other Ambulatory Visit (HOSPITAL_COMMUNITY): Payer: Self-pay | Admitting: Internal Medicine

## 2020-08-28 DIAGNOSIS — I4891 Unspecified atrial fibrillation: Secondary | ICD-10-CM | POA: Diagnosis not present

## 2020-08-28 LAB — CBC
HCT: 35.2 % — ABNORMAL LOW (ref 39.0–52.0)
Hemoglobin: 11.1 g/dL — ABNORMAL LOW (ref 13.0–17.0)
MCH: 24.5 pg — ABNORMAL LOW (ref 26.0–34.0)
MCHC: 31.5 g/dL (ref 30.0–36.0)
MCV: 77.7 fL — ABNORMAL LOW (ref 80.0–100.0)
Platelets: 156 10*3/uL (ref 150–400)
RBC: 4.53 MIL/uL (ref 4.22–5.81)
RDW: 16.1 % — ABNORMAL HIGH (ref 11.5–15.5)
WBC: 7.1 10*3/uL (ref 4.0–10.5)
nRBC: 0 % (ref 0.0–0.2)

## 2020-08-28 LAB — BASIC METABOLIC PANEL
Anion gap: 12 (ref 5–15)
BUN: 18 mg/dL (ref 8–23)
CO2: 23 mmol/L (ref 22–32)
Calcium: 8.4 mg/dL — ABNORMAL LOW (ref 8.9–10.3)
Chloride: 104 mmol/L (ref 98–111)
Creatinine, Ser: 1.16 mg/dL (ref 0.61–1.24)
GFR, Estimated: >60 mL/min (ref >60)
Glucose, Bld: 111 mg/dL — ABNORMAL HIGH (ref 70–99)
Potassium: 3.2 mmol/L — ABNORMAL LOW (ref 3.5–5.1)
Sodium: 139 mmol/L (ref 135–145)

## 2020-08-28 MED ORDER — TRAMADOL HCL 50 MG PO TABS: 100.0000 mg | ORAL_TABLET | Freq: Four times a day (QID) | ORAL | 0 refills | Status: AC | PRN

## 2020-08-28 MED ORDER — APIXABAN 5 MG PO TABS: 5.0000 mg | ORAL_TABLET | Freq: Two times a day (BID) | ORAL | 0 refills | Status: DC

## 2020-08-28 MED ORDER — AMIODARONE HCL 200 MG PO TABS: 200.0000 mg | ORAL_TABLET | Freq: Every day | ORAL | 0 refills | Status: DC

## 2020-08-28 MED ORDER — FUROSEMIDE 20 MG PO TABS
20.0000 mg | ORAL_TABLET | Freq: Every day | ORAL | Status: DC
  Administered 2020-08-28: 20 mg via ORAL
  Filled 2020-08-28: qty 1

## 2020-08-28 MED ORDER — POTASSIUM CHLORIDE CRYS ER 20 MEQ PO TBCR
40.0000 meq | EXTENDED_RELEASE_TABLET | ORAL | Status: AC
  Administered 2020-08-28 (×2): 40 meq via ORAL
  Filled 2020-08-28 (×2): qty 2

## 2020-08-28 MED ORDER — METOPROLOL SUCCINATE ER 100 MG PO TB24: 100.0000 mg | ORAL_TABLET | Freq: Every day | ORAL | 0 refills | Status: DC

## 2020-08-28 MED ORDER — LOSARTAN POTASSIUM 25 MG PO TABS: 25.0000 mg | ORAL_TABLET | Freq: Every day | ORAL | 0 refills | Status: DC

## 2020-08-28 MED ORDER — SPIRONOLACTONE 25 MG PO TABS: 12.5000 mg | ORAL_TABLET | Freq: Every day | ORAL | 0 refills | Status: DC

## 2020-08-28 MED ORDER — FERROUS SULFATE 325 (65 FE) MG PO TABS: 325.0000 mg | ORAL_TABLET | Freq: Two times a day (BID) | ORAL | 0 refills | Status: DC

## 2020-08-28 MED ORDER — FUROSEMIDE 20 MG PO TABS: 20.0000 mg | ORAL_TABLET | Freq: Every day | ORAL | 0 refills | Status: DC

## 2020-08-28 MED ORDER — LOSARTAN POTASSIUM 25 MG PO TABS
25.0000 mg | ORAL_TABLET | Freq: Every day | ORAL | Status: DC
  Administered 2020-08-28: 25 mg via ORAL
  Filled 2020-08-28: qty 1

## 2020-08-28 MED ORDER — METOPROLOL SUCCINATE ER 100 MG PO TB24
100.0000 mg | ORAL_TABLET | Freq: Every day | ORAL | Status: DC
  Administered 2020-08-28: 100 mg via ORAL
  Filled 2020-08-28: qty 1

## 2020-08-28 MED ORDER — AMIODARONE HCL 200 MG PO TABS
200.0000 mg | ORAL_TABLET | Freq: Every day | ORAL | Status: DC
  Administered 2020-08-28: 200 mg via ORAL
  Filled 2020-08-28: qty 1

## 2020-08-28 MED FILL — METOPROLOL SUCCINATE ER 100: 100 | 90 days supply | Qty: 90 | Fill #0

## 2020-08-28 MED FILL — LOSARTAN POTASSIUM 25 MG TA: 25 | 90 days supply | Qty: 90 | Fill #0

## 2020-08-28 MED FILL — SPIRONOLACTONE 25 MG TABS: 25 | 90 days supply | Qty: 45 | Fill #0

## 2020-08-28 MED FILL — traMADol HCL 50 MG TABS: 50 | 7 days supply | Qty: 30 | Fill #0

## 2020-08-28 MED FILL — ELIQUIS 5 MG TABLET: 5 | 90 days supply | Qty: 180 | Fill #0

## 2020-08-28 MED FILL — FUROSEMIDE 20 MG TABS: 20 | 90 days supply | Qty: 90 | Fill #0

## 2020-08-28 MED FILL — AMIODARONE HCL 200 MG TAB: 200 | 90 days supply | Qty: 90 | Fill #0

## 2020-08-28 NOTE — Care Management Important Message (Signed)
Important Message  Patient Details  Name: Tony Roberts MRN: [id number] Date of Birth: June 28, 1941   Medicare Important Message Given:  Yes  Patient left prior to IM delivery.  IM will be mailed to the patient home address.    [name] 08/28/2020, 3:22 PM

## 2020-08-28 NOTE — Progress Notes (Signed)
Heart Failure Stewardship Pharmacist Progress Note   PCP: Alroy Dust, L.Marlou Sa, MD PCP-Cardiologist: No primary care provider on file.    HPI:  79 yo M with PMH of HTN and GERD. He presented to the ED on 08/21/20 with DOE, increased cough, and palpitations. He was found to be in afib with RVR. An ECHO was done on 08/22/20 and LVEF is 25-30%. TEE/DCCV was successful on 08/27/20.  Current HF Medications: Furosemide 20 mg PO daily Metoprolol XL 100 mg daily Losartan 25 mg daily Spironolactone 12.5 mg daily  Prior to admission HF Medications: None  Pertinent Lab Values: . Serum creatinine 1.16, BUN 18, Potassium 3.2, Sodium 139, BNP 328  Vital Signs: . Weight: 183 lbs (admission weight: 187 lbs) . Blood pressure: 130/80s . Heart rate: 70s  Medication Assistance / Insurance Benefits Check: Does the patient have prescription insurance?  Yes Type of insurance plan: [facility] Medicare  Outpatient Pharmacy:  Prior to admission outpatient pharmacy: [facility] outpatient pharmacy Is the patient willing to use [facility] at discharge? Yes Is the patient willing to transition their outpatient pharmacy to utilize a [facility] outpatient pharmacy?   Yes    Assessment: 1. Acute systolic CHF (EF 90-21%), likely due to afib with RVR. NYHA class II symptoms. - Continue furosemide 20 mg daily. Potassium replaced (goal >4.0) - Continue metoprolol XL 100 mg daily - Losartan 25 mg daily started today with cardiology to consider transitioning to [facility] as outpatient. I would recommend to make this transition at the first follow up visit if SCr stable. BP seems as though it will allow transition to [facility] given average SBP > 110 - Continue spironolactone 12.5 mg daily. Consider increasing to target dose of 25 mg daily at follow up. - Consider starting Farxiga pending BP and SCr trends as outpatient - Not a candidate for ivabradine for further HR reduction given concurrent afib - Agree with stopping  diltiazem given LV dysfunction   Plan: 1) Medication changes recommended at this time: - Agree with changes as noted above  Potential medication changes to consider at follow up to continue to optimize HF regimen: - Switch losartan to Entresto 24/26 mg BID - Increase spironolactone to 25 mg daily - Add Farxiga 10 mg daily   2) Patient assistance application(s): - Entresto copay $47 per month - Farxiga copay $47 per month Can help enroll patient in patient assistance with drug manufacturers to bring copay down to $0 per fill for both Entresto and Farxiga  3)  Education  - Patient has been educated on current HF medications and potential additions to HF medication regimen Delene Loll, Farxiga) - Patient verbalizes understanding that over the next few months, these medication doses may change and more medications may be added to optimize HF regimen - Patient has been educated on basic disease state pathophysiology and goals of therapy - Time spent 30 mins  Kerby Nora, PharmD, BCPS Heart Failure Cytogeneticist Phone [phone]

## 2020-08-28 NOTE — Plan of Care (Signed)

## 2020-08-28 NOTE — Progress Notes (Signed)
CARDIOLOGY OFFICE NOTE  Date:  09/10/2020    Tony Roberts Date of Birth: May 06, 1941 Medical Record #[id number]  PCP:  Tony Roberts, L.Tony Sa, MD  Cardiologist:  Tony Roberts    Chief Complaint  Patient presents with  . Hospitalization Follow-up    Seen for Dr. Radford Roberts    History of Present Illness: Tony Roberts is a 79 y.o. male who presents today for a TOC visit. Seen for Dr. Radford Roberts (NEW).   He has a history of HTN, GERD, asthma, & diverticulosis.   Presented with new onset of palpitations with exertional shortness of breath and worsening of cough (chronic cough secondary to asthma, for which patient been following with allergist).He had been to see his allergist 3 days prior to admission and was found in rapid A. fib.   Before this, he had had 3 TIAs in last 1 month. Each episode last about 30 minutes, symptoms wasone-sided numbness and facial droop and speech problems. Symptoms resolved spontaneously. He went to see PCP after the first episode, and MRI was done on 11/15,with no acute stroke but tiny chronic infarct suspected in left cerebellum.   Echocardiogram with EF of 25 to 30%, moderate left ventricular hypertrophy, severe left atrial enlargement, mild right atrial enlargement, mild mitral regurgitation, mild aortic insufficiency and mildly dilated ascending aorta.  He was started on guideline medicine. Plan to repeat echo in 3 months. His stay was complicated by a fall with shoulder fracture.   Comes in today. Here alone. He feels like he is doing ok. He notes his HR has been in the mid 40's. He is not dizzy. No syncope. No chest pain. Breathing is ok. Wearing a sling for his shoulder. On Eliquis. No bleeding but lots of bruising. His weight is down. Never had swelling. He wants to get back to exercise - did bowl - but can't with his shoulder. He can use the elliptical.    Past Medical History:  Diagnosis Date  . Arthritis   . Asthma    last asthma attack at age 73   . GERD (gastroesophageal reflux disease)   . Headache   . Other specified iron deficiency anemias   . PAC (premature atrial contraction)    occasional PAC's    Past Surgical History:  Procedure Laterality Date  . CARDIOVERSION N/A 08/27/2020   Procedure: CARDIOVERSION;  Surgeon: Dorothy Spark, MD;  Location: [facility] ENDOSCOPY;  Service: Cardiovascular;  Laterality: N/A;  . CATARACT EXTRACTION    . HERNIA REPAIR    . SHOULDER CLOSED REDUCTION Left 08/23/2020   Procedure: CLOSED REDUCTION SHOULDER;  Surgeon: [name], MD;  Location: [facility];  Service: Orthopedics;  Laterality: Left;  . SPINE SURGERY  10/29/2016  . TEE WITHOUT CARDIOVERSION N/A 08/27/2020   Procedure: TRANSESOPHAGEAL ECHOCARDIOGRAM (TEE);  Surgeon: Dorothy Spark, MD;  Location: [facility] ENDOSCOPY;  Service: Cardiovascular;  Laterality: N/A;  . TONSILLECTOMY       Medications: Current Meds  Medication Sig  . ADVAIR DISKUS 250-50 MCG/DOSE AEPB INHALE 1 PUFF BY MOUTH ONCE TO TWICE A DAY TO PREVENT COUGH OR WHEEZE. RINSE, GARGLE, AND SPIT AFTER USE.  Marland Kitchen albuterol (PROVENTIL HFA) 108 (90 Base) MCG/ACT inhaler Inhale two puffs every four to six hours as needed for cough or wheeze.  Marland Kitchen amiodarone (PACERONE) 200 MG tablet Take 1 tablet (200 mg total) by mouth daily.  Marland Kitchen apixaban (ELIQUIS) 5 MG TABS tablet Take 1 tablet (5 mg total) by mouth 2 (two) times daily.  Marland Kitchen  esomeprazole (NEXIUM) 40 MG capsule Take 40 mg by mouth See admin instructions. Take 40 mg by mouth in the morning before breakfast and an additional 40 mg once a day as needed for recurring heartburn  . famotidine (PEPCID) 40 MG tablet Take 40 mg by mouth at bedtime.   . ferrous sulfate 325 (65 FE) MG tablet Take 1 tablet (325 mg total) by mouth 2 (two) times daily with a meal.  . losartan (COZAAR) 25 MG tablet Take 1 tablet (25 mg total) by mouth daily.  . montelukast (SINGULAIR) 10 MG tablet TAKE 1 TABLET (10 MG TOTAL) BY MOUTH AT BEDTIME.  Marland Kitchen spironolactone  (ALDACTONE) 25 MG tablet Take 0.5 tablets (12.5 mg total) by mouth daily.  . traMADol (ULTRAM) 50 MG tablet   . triamcinolone (NASACORT) 55 MCG/ACT AERO nasal inhaler Place 1 spray into the nose daily as needed (for allergies or rhinitis).   . [DISCONTINUED] furosemide (LASIX) 20 MG tablet Take 1 tablet (20 mg total) by mouth daily.  . [DISCONTINUED] metoprolol succinate (TOPROL-XL) 100 MG 24 hr tablet Take 1 tablet (100 mg total) by mouth daily. Take with or immediately following a meal.  . [DISCONTINUED] naproxen sodium (ALEVE) 220 MG tablet      Allergies: No Known Allergies  Social History: The patient  reports that he has never smoked. He has never used smokeless tobacco. He reports that he does not drink alcohol and does not use drugs.   Family History: The patient's family history includes Alzheimer's disease in his mother; Asthma in his father.   Review of Systems: Please see the history of present illness.   All other systems are reviewed and negative.   Physical Exam: VS:  BP 140/76   Pulse (!) 49   Ht 5\' 6"  (1.676 m)   Wt 183 lb 9.6 oz (83.3 kg)   SpO2 99%   BMI 29.63 kg/m  .  BMI Body mass index is 29.63 kg/m.  Wt Readings from Last 3 Encounters:  09/10/20 183 lb 9.6 oz (83.3 kg)  08/28/20 183 lb 3.2 oz (83.1 kg)  08/19/20 198 lb (89.8 kg)   BP by me is 118/60.  General: Pleasant. Alert and in no acute distress.  Weight is down.  Cardiac: Regular rate and rhythm but slow. No murmurs, rubs, or gallops. No edema.  Respiratory:  Lungs are clear to auscultation bilaterally with normal work of breathing.  GI: Soft and nontender.  MS: No deformity or atrophy. Gait and ROM intact.  Skin: Warm and dry. Color is normal. Extensive bruising under the left arm/side noted.  Neuro:  Strength and sensation are intact and no gross focal deficits noted.  Psych: Alert, appropriate and with normal affect.   LABORATORY DATA:  EKG:  EKG is ordered today.  Personally reviewed  by me. This demonstrates marked bradycardia - HR is 49.  Lab Results  Component Value Date   WBC 7.1 08/28/2020   HGB 11.1 (L) 08/28/2020   HCT 35.2 (L) 08/28/2020   PLT 156 08/28/2020   GLUCOSE 111 (H) 08/28/2020   CHOL 187 08/21/2020   TRIG 64 08/21/2020   HDL 37 (L) 08/21/2020   LDLCALC 137 (H) 08/21/2020   ALT 20 08/21/2020   AST 26 08/21/2020   NA 139 08/28/2020   K 3.2 (L) 08/28/2020   CL 104 08/28/2020   CREATININE 1.16 08/28/2020   BUN 18 08/28/2020   CO2 23 08/28/2020   TSH 4.301 08/21/2020   INR 1.11 10/23/2016  HGBA1C 5.8 (H) 08/21/2020     BNP (last 3 results) Recent Labs    08/21/20 1127  BNP 328.4*    ProBNP (last 3 results) No results for input(s): PROBNP in the last 8760 hours.   Other Studies Reviewed Today:   ECHO IMPRESSIONS 08/2020  1. Left ventricular ejection fraction, by estimation, is 25 to 30%. The  left ventricle has severely decreased function. The left ventricle  demonstrates global hypokinesis. There is moderate left ventricular  hypertrophy of the basal-septal segment. Left  ventricular diastolic function could not be evaluated.  2. Right ventricular systolic function is normal. The right ventricular  size is normal. Tricuspid regurgitation signal is inadequate for assessing  PA pressure.  3. Left atrial size was severely dilated.  4. Right atrial size was mildly dilated.  5. The mitral valve is normal in structure. Mild mitral valve  regurgitation. No evidence of mitral stenosis.  6. The aortic valve is tricuspid. Aortic valve regurgitation is mild.  Mild aortic valve sclerosis is present, with no evidence of aortic valve  stenosis.  7. Aortic dilatation noted. There is mild dilatation of the ascending  aorta, measuring 40 mm.  8. The inferior vena cava is normal in size with greater than 50%  respiratory variability, suggesting right atrial pressure of 3 mmHg.      Assessment/Plan:  1. PAF - back in sinus  following TEE cardioversion - now on amiodarone.   2. Marked bradycardia - cutting Toprol back to 50 mg a day. EKG on return.   3. Systolic HF - EF is 25 to 30% - on low dose Aldactone, ARB, Lasix and Toprol. Cutting Toprol back due to bradycardia. Lab today. Could consider Entresto but he likes this current regimen. Lasix is changed to prn. This may have been tachycardia mediated. If EF fails to recover - then to have ischemic evaluation - he has had no chest pain.   4. Chronic anticoagulation - lab today.   5. Valvular heart disease - would follow.   6. Left shoulder dislocation - s/p closed reduction - plan per ortho.   7. Chronic iron deficiency anemia - will need to follow. He is on replacement along with PPI and Pepcid therapy.   8. Prior TIAs - most likely from AF that has now been diagnosed.   9. NSVT - on beta blocker and amiodarone.   Current medicines are reviewed with the patient today.  The patient does not have concerns regarding medicines other than what has been noted above.  The following changes have been made:  See above.  Labs/ tests ordered today include:    Orders Placed This Encounter  Procedures  . Basic metabolic panel  . CBC  . EKG 12-Lead     Disposition:   FU with Dr. Radford Roberts in a few weeks with repeat EKG - cutting Toprol back today. Changing Lasix to prn. Lab today. Overall, felt to be doing ok.    Patient is agreeable to this plan and will call if any problems develop in the interim.   SignedTruitt Merle, NP  09/10/2020 11:47 AM  [facility] [address] [place], [place]  [address] Phone: [phone] Fax: [phone]

## 2020-08-28 NOTE — Discharge Summary (Signed)
Physician Discharge Summary  Tony Roberts TUU:[id number] DOB: 03-May-1941 DOA: 08/21/2020  PCP: Alroy Dust, L.Marlou Sa, MD  Admit date: 08/21/2020 Discharge date: 08/28/2020  Admitted From: Home  Disposition: Home   Recommendations for Outpatient Follow-up:  1. Follow up with PCP in 1-2 weeks 2. Follow-up with cardiology APP in 1-2 weeks 3. Follow-up with cardiology, Dr. Radford Pax 3 months with repeat echocardiogram 4. Follow-up with orthopedics, Dr. Erlinda Hong in one week for shoulder dislocation 5. Discontinue amlodipine 6. Started on amiodarone 200 mg p.o. daily, Toprol-XL 100 mg p.o. daily, losartan 25 mg p.o. daily, furosemide 20 mg p.o. daily and spironolactone 12.5 mg p.o. daily for A. fib with RVR and heart failure with reduced EF. 7. Please obtain BMP in one week to assess potassium  Home Health: No Equipment/Devices: None  Discharge Condition: Stable CODE STATUS: Full code Diet recommendation: Heart healthy diet  History of present illness:  Tony Roberts is a 79 y.o. male with medical history significant of HTN, GERD, asthma, diverticulosis, presented with new onset of palpitations.  Symptoms started 4 to 5 days ago, with persistent palpitations, exertional shortness of breath and worsening of cough (chronic cough secondary to asthma, for which patient been following with allergist).  Patient went to see allergist 3 days ago, was found in rapid A. fib.  Denied any chest pain, no leg swelling no fever chills weight loss.  Before this, patient has had 3 TIAs in last 1 month.  Each episode last about 30 minutes, symptoms was one-sided numbness and facial droop and speech problems.  Symptoms resolved spontaneously.  Patient went to see PCP after the first episode, and MRI was done on 11/15, with no acute stroke but tiny chronic infarct suspected in left cerebellum.   In the ED, patient was found heart rate in the 150s, Cardizem drip was started.  TRH consulted for further evaluation and  management.  Hospital course:  New onset atrial fibrillation with RVR Cardiology was consulted and followed during hospital course.  Patient underwent TEE and cardioversion on 08/27/2020 with rhythm conversion back to normal sinus rhythm.  IV amiodarone drip was tapered off and will continue amiodarone 200 mg p.o. daily.  Continue Toprol-XL milligrams p.o. daily and started on Eliquis.  Outpatient follow-up with cardiology.  Cardiomyopathy Acute systolic congestive heart failure TTE 08/22/2020 with LVEF 25-30%.  Thought to be possibly tachycardia mediated from A. fib with RVR as above.  Started on metoprolol succinate 100 mg p.o. daily, losartan 25 mg p.o. daily, spironolactone 12.5 mg p.o. daily, and Lasix 10 mg p.o. daily.  Outpatient follow-up with cardiology.  Plan repeat echo cardiogram in 3 months.  Consideration of transitioning [facility] to [facility] outpatient.  Repeat BMP in 1 week to assess potassium level.  Mitral regurgitation Aortic insufficiency Outpatient follow-up with cardiology with further monitoring with echo outpatient.  Left shoulder dislocation Patient suffered left shoulder dislocation following mechanical fall while inpatient.  Patient underwent closed reduction of left shoulder dislocation by Dr. Erlinda Hong on 08/23/2020.  Continue shoulder immobilizer at all times.  Outpatient follow-up with orthopedics 1 week.  Tramadol as needed for pain control.  Chronic iron deficiency anemia  Seen by gastroenterology inpatient, no inpatient GI work-up recommended.  Continue PPI and iron supplementation.  Outpatient follow-up with Dr. Cristina Gong.  Hypokalemia Repleted during hospitalization, likely secondary to aggressive IV diuresis.  Repeat BMP 1 week following hospitalization.  History of TIAs Likely related to underlying atrial fibrillation which is a new diagnosis.  Started on Eliquis as above.  Asthma, mild intermittent Continue home inhalers.  Obesity Body mass index is 29.57 kg/m.   Discussed with patient needs for aggressive lifestyle changes/weight loss as this complicates all facets of care.   Discharge Diagnoses:  Active Problems:   Traumatic closed displaced fracture of left shoulder with anterior dislocation    Discharge Instructions  Discharge Instructions    Call MD for:  difficulty breathing, headache or visual disturbances   Complete by: As directed    Call MD for:  extreme fatigue   Complete by: As directed    Call MD for:  persistant dizziness or light-headedness   Complete by: As directed    Call MD for:  persistant nausea and vomiting   Complete by: As directed    Call MD for:  severe uncontrolled pain   Complete by: As directed    Call MD for:  temperature >100.4   Complete by: As directed    Diet - low sodium heart healthy   Complete by: As directed    Increase activity slowly   Complete by: As directed    No wound care   Complete by: As directed      Allergies as of 08/28/2020   No Known Allergies     Medication List    STOP taking these medications   amLODipine 5 MG tablet Commonly known as: NORVASC   omeprazole 40 MG capsule Commonly known as: PRILOSEC     TAKE these medications   Advair Diskus 250-50 MCG/DOSE Aepb Generic drug: Fluticasone-Salmeterol INHALE 1 PUFF BY MOUTH ONCE TO TWICE A DAY TO PREVENT COUGH OR WHEEZE. RINSE, GARGLE, AND SPIT AFTER USE. What changed: See the new instructions.   albuterol 108 (90 Base) MCG/ACT inhaler Commonly known as: Proventil HFA Inhale two puffs every four to six hours as needed for cough or wheeze. What changed:   how much to take  how to take this  when to take this  additional instructions   amiodarone 200 MG tablet Commonly known as: PACERONE Take 1 tablet (200 mg total) by mouth daily. Start taking on: August 29, 2020   apixaban 5 MG Tabs tablet Commonly known as: ELIQUIS Take 1 tablet (5 mg total) by mouth 2 (two) times daily.   esomeprazole 40 MG  capsule Commonly known as: NEXIUM Take 40 mg by mouth See admin instructions. Take 40 mg by mouth in the morning before breakfast and an additional 40 mg once a day as needed for recurring heartburn   famotidine 40 MG tablet Commonly known as: PEPCID Take 40 mg by mouth at bedtime.   ferrous sulfate 325 (65 FE) MG tablet Take 1 tablet (325 mg total) by mouth 2 (two) times daily with a meal.   furosemide 20 MG tablet Commonly known as: LASIX Take 1 tablet (20 mg total) by mouth daily. Start taking on: August 29, 2020   losartan 25 MG tablet Commonly known as: COZAAR Take 1 tablet (25 mg total) by mouth daily. Start taking on: August 29, 2020   metoprolol succinate 100 MG 24 hr tablet Commonly known as: TOPROL-XL Take 1 tablet (100 mg total) by mouth daily. Take with or immediately following a meal. Start taking on: August 29, 2020   montelukast 10 MG tablet Commonly known as: SINGULAIR TAKE 1 TABLET (10 MG TOTAL) BY MOUTH AT BEDTIME.   Nasacort Allergy 24HR 55 MCG/ACT Aero nasal inhaler Generic drug: triamcinolone Place 1 spray into the nose daily as needed (for allergies or rhinitis).  spironolactone 25 MG tablet Commonly known as: ALDACTONE Take 0.5 tablets (12.5 mg total) by mouth daily. Start taking on: August 29, 2020   traMADol 50 MG tablet Commonly known as: ULTRAM Take 2 tablets (100 mg total) by mouth every 6 (six) hours as needed for up to 7 days for moderate pain or severe pain.       Follow-up Information    [name], MD In 1 week.   Specialty: Orthopedic Surgery Why: recheck left shoulder dislocation Contact information: [place] [address] [phone]        Alroy Dust, L.Marlou Sa, MD. Schedule an appointment as soon as possible for a visit in 1 week(s).   Specialty: Family Medicine Contact information: [address] [place] [address] [phone]        Skeet Latch, MD. Schedule an  appointment as soon as possible for a visit in 3 month(s).   Specialty: Cardiology Contact information: [address] [place] [address] [phone]              No Known Allergies  Consultations:  [facility]  Cardiology  Orthopedics, Dr. Erlinda Hong   Procedures/Studies: CT HEAD WO CONTRAST  Result Date: 08/23/2020 CLINICAL DATA:  Head trauma EXAM: CT HEAD WITHOUT CONTRAST CT CERVICAL SPINE WITHOUT CONTRAST TECHNIQUE: Multidetector CT imaging of the head and cervical spine was performed following the standard protocol without intravenous contrast. Multiplanar CT image reconstructions of the cervical spine were also generated. COMPARISON:  None. FINDINGS: CT HEAD FINDINGS Brain: There is no mass, hemorrhage or extra-axial collection. The size and configuration of the ventricles and extra-axial CSF spaces are normal. The brain parenchyma is normal, without evidence of acute or chronic infarction. Vascular: Atherosclerotic calcification of the vertebral and internal carotid arteries at the skull base. No abnormal hyperdensity of the major intracranial arteries or dural venous sinuses. Skull: The visualized skull base, calvarium and extracranial soft tissues are normal. Sinuses/Orbits: No fluid levels or advanced mucosal thickening of the visualized paranasal sinuses. No mastoid or middle ear effusion. The orbits are normal. CT CERVICAL SPINE FINDINGS Alignment: No static subluxation. Facets are aligned. Occipital condyles are normally positioned. Skull base and vertebrae: No acute fracture. Soft tissues and spinal canal: No prevertebral fluid or swelling. No visible canal hematoma. Disc levels: No advanced spinal canal or neural foraminal stenosis. Upper chest: No pneumothorax, pulmonary nodule or pleural effusion. Other: Incidentally noted Klippel-Feil configuration. IMPRESSION: 1. No acute intracranial abnormality. 2. No acute fracture or static subluxation of the cervical spine.  Electronically Signed   By: Ulyses Jarred M.D.   On: 08/23/2020 00:39   CT CERVICAL SPINE WO CONTRAST  Result Date: 08/23/2020 CLINICAL DATA:  Head trauma EXAM: CT HEAD WITHOUT CONTRAST CT CERVICAL SPINE WITHOUT CONTRAST TECHNIQUE: Multidetector CT imaging of the head and cervical spine was performed following the standard protocol without intravenous contrast. Multiplanar CT image reconstructions of the cervical spine were also generated. COMPARISON:  None. FINDINGS: CT HEAD FINDINGS Brain: There is no mass, hemorrhage or extra-axial collection. The size and configuration of the ventricles and extra-axial CSF spaces are normal. The brain parenchyma is normal, without evidence of acute or chronic infarction. Vascular: Atherosclerotic calcification of the vertebral and internal carotid arteries at the skull base. No abnormal hyperdensity of the major intracranial arteries or dural venous sinuses. Skull: The visualized skull base, calvarium and extracranial soft tissues are normal. Sinuses/Orbits: No fluid levels or advanced mucosal thickening of the visualized paranasal sinuses.  No mastoid or middle ear effusion. The orbits are normal. CT CERVICAL SPINE FINDINGS Alignment: No static subluxation. Facets are aligned. Occipital condyles are normally positioned. Skull base and vertebrae: No acute fracture. Soft tissues and spinal canal: No prevertebral fluid or swelling. No visible canal hematoma. Disc levels: No advanced spinal canal or neural foraminal stenosis. Upper chest: No pneumothorax, pulmonary nodule or pleural effusion. Other: Incidentally noted Klippel-Feil configuration. IMPRESSION: 1. No acute intracranial abnormality. 2. No acute fracture or static subluxation of the cervical spine. Electronically Signed   By: Ulyses Jarred M.D.   On: 08/23/2020 00:39   MR BRAIN W WO CONTRAST  Result Date: 08/05/2020 CLINICAL DATA:  79 year old male with recurrent episodes of left side numbness in the left face  and extremities over the last several weeks. Each episode 20-30 minutes. EXAM: MRI HEAD WITHOUT AND WITH CONTRAST TECHNIQUE: Multiplanar, multiecho pulse sequences of the brain and surrounding structures were obtained without and with intravenous contrast. CONTRAST:  75mL GADAVIST GADOBUTROL 1 MMOL/ML IV SOLN COMPARISON:  None. FINDINGS: Brain: No restricted diffusion to suggest acute infarction. No midline shift, mass effect, evidence of mass lesion, ventriculomegaly, extra-axial collection or acute intracranial hemorrhage. Cervicomedullary junction and pituitary are within normal limits. Pearline Cables and white matter signal is largely normal for age throughout the brain. Minimal for age nonspecific scattered small cerebral white matter T2 and FLAIR hyperintense foci, such as series 8, image 36. No cortical encephalomalacia or chronic cerebral blood products identified. Deep gray matter nuclei and brainstem appear within normal limits. There is a subtle chronic infarct suspected in the left lateral cerebellum (series 10, image 35). No abnormal enhancement identified.  No dural thickening. Vascular: Major intracranial vascular flow voids are preserved. The major dural venous sinuses are enhancing and appear to be patent. 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 Otherwise negative visible upper cervical spine. Visualized bone marrow signal is within normal limits. Sinuses/Orbits: Postoperative changes to both globes, otherwise negative. Other: Mastoids are clear. Visible internal auditory structures appear normal. Visible scalp and face soft tissues appear negative. IMPRESSION: No acute intracranial abnormality and largely normal for age MRI appearance of the brain. A tiny chronic infarct is suspected in the left cerebellum. Electronically Signed   By: Genevie Ann M.D.   On: 08/05/2020 12:05   MR SHOULDER LEFT WO CONTRAST  Result Date: 08/24/2020 CLINICAL DATA:  Left shoulder  pain status post dislocation. Fell last night. EXAM: MRI OF THE LEFT SHOULDER WITHOUT CONTRAST TECHNIQUE: Multiplanar, multisequence MR imaging of the shoulder was performed. No intravenous contrast was administered. COMPARISON:  None. FINDINGS: Rotator cuff: Complete tear of the supraspinatus and infraspinatus tendons with 3.4 cm of retraction. Teres minor tendon is intact. High-grade partial-thickness versus complete tear of the subscapularis tendon. Muscles: Muscle edema in the posterior deltoid muscle most concerning for muscle strain. Muscle edema in the supraspinatus and infraspinatus muscles most consistent with muscle strain with perifascial edema. Muscle edema in the subscapularis muscle consistent with muscle strain. Biceps Long Head: Medial dislocation of the long head of the biceps tendon from the bicipital groove. Acromioclavicular Joint: Mild arthropathy of the acromioclavicular joint. Type II acromion. Trace subacromial/subdeltoid bursal fluid. Glenohumeral Joint: Small joint effusion. Attenuation of the humeral attachment of the inferior glenohumeral ligament concerning for injury without complete disruption. No chondral defect. Labrum: Grossly intact, but evaluation is limited by lack of intraarticular fluid/contrast. Bones: No fracture or dislocation. No aggressive osseous lesion. Mild osseous contusion of the superior  posterolateral humeral head as can be seen with a Hill-Sachs lesion. Other: No fluid collection or hematoma. IMPRESSION: 1. Complete tear of the supraspinatus and infraspinatus tendons with 3.4 cm of retraction. 2. High-grade partial-thickness versus complete tear of the subscapularis tendon. 3. Muscle edema in the posterior deltoid muscle most concerning for muscle strain. 4. Muscle edema in the supraspinatus and infraspinatus muscles most consistent with muscle strain with perifascial edema. 5. Muscle edema in the subscapularis muscle consistent with muscle strain. 6. Attenuation  of the humeral attachment of the inferior glenohumeral ligament concerning for injury without complete disruption. 7. Osseous contusion of the superior posterior humeral head as can be seen with a Hill-Sachs lesion as a result of the sequela of anterior shoulder dislocation. Electronically Signed   By: Kathreen Devoid   On: 08/24/2020 13:19   DG Chest Port 1 View  Result Date: 08/21/2020 CLINICAL DATA:  Shortness of breath and palpitation EXAM: PORTABLE CHEST 1 VIEW COMPARISON:  10/23/2016 FINDINGS: Cardiomegaly and aortic tortuosity. Cardiomegaly appears new/progressed. Suspect small right pleural effusion. Mild interstitial prominence that is similar to prior. No pneumothorax. IMPRESSION: 1. Cardiopericardial enlargement. 2. Small right pleural effusion. Electronically Signed   By: Monte Fantasia M.D.   On: 08/21/2020 11:37   DG Shoulder Left Port  Result Date: 08/23/2020 CLINICAL DATA:  Closed reduction LEFT shoulder EXAM: LEFT SHOULDER COMPARISON:  08/22/2020 FINDINGS: Initial AP image at 1122 hours demonstrates overlap of the LEFT humeral head with the glenoid likely representing persistent anterior dislocation. Second image at 1126 hours, axillary view, demonstrates either persistent anterior dislocation versus less likely marked anterior subluxation. IMPRESSION: Initial image demonstrates persistent anterior glenohumeral dislocation with the axillary view demonstrating persistent anterior dislocation versus less likely marked anterior subluxation. Findings called to Dr. Erlinda Hong on 08/23/2020 at 1410 hours. Electronically Signed   By: Lavonia Dana M.D.   On: 08/23/2020 14:13   DG Shoulder Left Port  Result Date: 08/22/2020 CLINICAL DATA:  Recent fall with left shoulder pain and deformity, initial encounter EXAM: LEFT SHOULDER COMPARISON:  None. FINDINGS: Anterior inferior dislocation of the humeral head with respect to the glenoid is noted. No acute fracture is seen. The underlying bony thorax and  remainder of the shoulder girdle appear within normal limits. IMPRESSION: Anterior inferior dislocation of the left humeral head as described. Electronically Signed   By: Inez Catalina M.D.   On: 08/22/2020 22:36   ECHOCARDIOGRAM COMPLETE  Result Date: 08/22/2020    ECHOCARDIOGRAM REPORT   Patient Name:   Tony Roberts Date of Exam: 08/22/2020 Medical Rec #:  [id number]      Height:       65.0 in Accession #:    [id number]     Weight:       187.2 lb Date of Birth:  08/08/1941      BSA:          1.923 m Patient Age:    83 years       BP:           126/96 mmHg Patient Gender: M              HR:           96 bpm. Exam Location:  Inpatient Procedure: 2D Echo, Cardiac Doppler and Color Doppler Indications:    Atrial fibrillation  History:        Patient has no prior history of Echocardiogram examinations.  Sonographer:    Clayton Lefort RDCS (AE) Referring Phys: [phone]  PING T ZHANG IMPRESSIONS  1. Left ventricular ejection fraction, by estimation, is 25 to 30%. The left ventricle has severely decreased function. The left ventricle demonstrates global hypokinesis. There is moderate left ventricular hypertrophy of the basal-septal segment. Left ventricular diastolic function could not be evaluated.  2. Right ventricular systolic function is normal. The right ventricular size is normal. Tricuspid regurgitation signal is inadequate for assessing PA pressure.  3. Left atrial size was severely dilated.  4. Right atrial size was mildly dilated.  5. The mitral valve is normal in structure. Mild mitral valve regurgitation. No evidence of mitral stenosis.  6. The aortic valve is tricuspid. Aortic valve regurgitation is mild. Mild aortic valve sclerosis is present, with no evidence of aortic valve stenosis.  7. Aortic dilatation noted. There is mild dilatation of the ascending aorta, measuring 40 mm.  8. The inferior vena cava is normal in size with greater than 50% respiratory variability, suggesting right atrial pressure of 3  mmHg. FINDINGS  Left Ventricle: Left ventricular ejection fraction, by estimation, is 25 to 30%. The left ventricle has severely decreased function. The left ventricle demonstrates global hypokinesis. The left ventricular internal cavity size was normal in size. There is moderate left ventricular hypertrophy of the basal-septal segment. Left ventricular diastolic function could not be evaluated due to atrial fibrillation. Left ventricular diastolic function could not be evaluated. Right Ventricle: The right ventricular size is normal. Right ventricular systolic function is normal. Tricuspid regurgitation signal is inadequate for assessing PA pressure. The tricuspid regurgitant velocity is 2.07 m/s, and with an assumed right atrial  pressure of 15 mmHg, the estimated right ventricular systolic pressure is 57.2 mmHg. Left Atrium: Left atrial size was severely dilated. Right Atrium: Right atrial size was mildly dilated. Pericardium: There is no evidence of pericardial effusion. Mitral Valve: The mitral valve is normal in structure. Mild mitral valve regurgitation. No evidence of mitral valve stenosis. Tricuspid Valve: The tricuspid valve is normal in structure. Tricuspid valve regurgitation is mild . No evidence of tricuspid stenosis. Aortic Valve: The aortic valve is tricuspid. Aortic valve regurgitation is mild. Aortic regurgitation PHT measures 953 msec. Mild aortic valve sclerosis is present, with no evidence of aortic valve stenosis. Pulmonic Valve: The pulmonic valve was not well visualized. Pulmonic valve regurgitation is not visualized. No evidence of pulmonic stenosis. Aorta: Aortic dilatation noted. There is mild dilatation of the ascending aorta, measuring 40 mm. Venous: The inferior vena cava is normal in size with greater than 50% respiratory variability, suggesting right atrial pressure of 3 mmHg. IAS/Shunts: No atrial level shunt detected by color flow Doppler.  LEFT VENTRICLE PLAX 2D LVIDd:         4.40  cm LVIDs:         3.60 cm LV PW:         1.80 cm LV IVS:        1.70 cm LVOT diam:     2.10 cm LV SV:         55 LV SV Index:   28 LVOT Area:     3.46 cm  LV Volumes (MOD) LV vol d, MOD A2C: 68.8 ml LV vol d, MOD A4C: 104.0 ml LV vol s, MOD A2C: 51.5 ml LV vol s, MOD A4C: 76.3 ml LV SV MOD A2C:     17.3 ml LV SV MOD A4C:     104.0 ml LV SV MOD BP:      21.5 ml RIGHT VENTRICLE  IVC RV Basal diam:  3.60 cm     IVC diam: 2.30 cm RV Mid diam:    1.90 cm RV S prime:     11.00 cm/s TAPSE (M-mode): 1.6 cm LEFT ATRIUM              Index       RIGHT ATRIUM           Index LA diam:        4.10 cm  2.13 cm/m  RA Area:     23.30 cm LA Vol (A2C):   155.0 ml 80.60 ml/m RA Volume:   65.80 ml  34.21 ml/m LA Vol (A4C):   128.0 ml 66.56 ml/m LA Biplane Vol: 142.0 ml 73.84 ml/m  AORTIC VALVE LVOT Vmax:   91.78 cm/s LVOT Vmean:  59.840 cm/s LVOT VTI:    0.157 m AI PHT:      953 msec  AORTA Ao Root diam: 3.30 cm Ao Asc diam:  4.00 cm TRICUSPID VALVE TR Peak grad:   17.1 mmHg TR Vmax:        207.00 cm/s  SHUNTS Systemic VTI:  0.16 m Systemic Diam: 2.10 cm Kirk Ruths MD Electronically signed by Kirk Ruths MD Signature Date/Time: 08/22/2020/6:31:36 PM    Final    ECHO TEE  Result Date: 08/27/2020    TRANSESOPHOGEAL ECHO REPORT   Patient Name:   Tony Roberts Date of Exam: 08/27/2020 Medical Rec #:  [id number]      Height:       66.0 in Accession #:    [id number]     Weight:       184.0 lb Date of Birth:  11/23/1940      BSA:          1.930 m Patient Age:    60 years       BP:           182/89 mmHg Patient Gender: M              HR:           131 bpm. Exam Location:  Inpatient Procedure: Transesophageal Echo, Cardiac Doppler and Color Doppler Indications:     I48.91* Unspeicified atrial fibrillation  History:         Patient has prior history of Echocardiogram examinations, most                  recent 08/22/2020. Abnormal ECG; Arrythmias:Atrial Fibrillation.  Sonographer:     Roseanna Rainbow RDCS Referring Phys:  [place] Diagnosing Phys: Ena Dawley MD PROCEDURE: After discussion of the risks and benefits of a TEE, an informed consent was obtained from the patient. The transesophogeal probe was passed without difficulty through the esophogus of the patient. Imaged were obtained with the patient in a supine position. Sedation performed by different physician. The patient was monitored while under deep sedation. Anesthestetic sedation was provided intravenously by Anesthesiology: 160mg  of Propofol, 100mg  of Lidocaine. The patient's vital signs; including heart rate, blood pressure, and oxygen saturation; remained stable throughout the procedure. The patient developed no complications during the procedure. A successful direct current cardioversion was performed at 200 joules with 1 attempt. IMPRESSIONS  1. Left ventricular ejection fraction, by estimation, is 20 to 25%. The left ventricle has severely decreased function. The left ventricle demonstrates global hypokinesis. The left ventricular internal cavity size was mildly dilated. Left ventricular diastolic function could not be evaluated.  2. Right ventricular systolic  function is normal. The right ventricular size is normal.  3. Left atrial size was severely dilated. No left atrial/left atrial appendage thrombus was detected.  4. Right atrial size was severely dilated.  5. The mitral valve is degenerative. Mild to moderate mitral valve regurgitation. No evidence of mitral stenosis.  6. The aortic valve is tricuspid. Aortic valve regurgitation is moderate. No aortic stenosis is present.  7. The inferior vena cava is normal in size with greater than 50% respiratory variability, suggesting right atrial pressure of 3 mmHg.  8. Evidence of atrial level shunting detected by color flow Doppler. There is a small patent foramen ovale with predominantly left to right shunting across the atrial septum. Conclusion(s)/Recommendation(s): No LA/LAA thrombus identified.  Successful cardioversion performed with restoration of normal sinus rhythm. FINDINGS  Left Ventricle: Left ventricular ejection fraction, by estimation, is 20 to 25%. The left ventricle has severely decreased function. The left ventricle demonstrates global hypokinesis. The left ventricular internal cavity size was mildly dilated. There is no left ventricular hypertrophy. Left ventricular diastolic function could not be evaluated. Right Ventricle: The right ventricular size is normal. No increase in right ventricular wall thickness. Right ventricular systolic function is normal. Left Atrium: Left atrial size was severely dilated. No left atrial/left atrial appendage thrombus was detected. Right Atrium: Right atrial size was severely dilated. Pericardium: There is no evidence of pericardial effusion. Mitral Valve: The mitral valve is degenerative in appearance. Mild to moderate mitral valve regurgitation. No evidence of mitral valve stenosis. Tricuspid Valve: The tricuspid valve is normal in structure. Tricuspid valve regurgitation is mild . No evidence of tricuspid stenosis. Aortic Valve: The aortic valve is tricuspid. Aortic valve regurgitation is moderate. No aortic stenosis is present. Pulmonic Valve: The pulmonic valve was normal in structure. Pulmonic valve regurgitation is not visualized. No evidence of pulmonic stenosis. Aorta: The aortic root is normal in size and structure. Venous: The inferior vena cava is normal in size with greater than 50% respiratory variability, suggesting right atrial pressure of 3 mmHg. IAS/Shunts: Evidence of atrial level shunting detected by color flow Doppler. A small patent foramen ovale is detected with predominantly left to right shunting across the atrial septum.  LEFT VENTRICLE PLAX 2D LVOT diam:     2.10 cm LVOT Area:     3.46 cm   SHUNTS Systemic Diam: 2.10 cm Ena Dawley MD Electronically signed by Ena Dawley MD Signature Date/Time: 08/27/2020/9:05:49 AM    Final        Subjective: Patient seen and examined bedside, resting comfortably.  No complaints this morning.  Ready for discharge home.  Denies headache, no visual changes, no chest pain, no palpitations, no shortness of breath, no abdominal pain, no weakness, no fatigue, no paresthesias.  No acute events overnight per nursing staff.  Discharge Exam: Vitals:   08/28/20 0855 08/28/20 0904  BP: 127/70   Pulse: (!) 114   Resp: 19   Temp: 97.8 F (36.6 C)   SpO2: 90% 93%   Vitals:   08/28/20 0403 08/28/20 0427 08/28/20 0855 08/28/20 0904  BP: (!) 142/86  127/70   Pulse: 68  (!) 114   Resp: 18  19   Temp: 98 F (36.7 C)  97.8 F (36.6 C)   TempSrc: Oral  Oral   SpO2: 96%  90% 93%  Weight:  83.1 kg    Height:        General: Pt is alert, awake, not in acute distress Cardiovascular: RRR, S1/S2 +, no rubs,  no gallops Respiratory: CTA bilaterally, no wheezing, no rhonchi Abdominal: Soft, NT, ND, bowel sounds + Extremities: no edema, no cyanosis, left shoulder with sling/immobilizer in place    The results of significant diagnostics from this hospitalization (including imaging, microbiology, ancillary and laboratory) are listed below for reference.     Microbiology: Recent Results (from the past 240 hour(s))  Resp Panel by RT-PCR (Flu A&B, Covid) Nasopharyngeal Swab     Status: None   Collection Time: 08/21/20  2:18 PM   Specimen: Nasopharyngeal Swab; Nasopharyngeal(NP) swabs in vial transport medium  Result Value Ref Range Status   SARS Coronavirus 2 by RT PCR NEGATIVE NEGATIVE Final    Comment: (NOTE) SARS-CoV-2 target nucleic acids are NOT DETECTED.  The SARS-CoV-2 RNA is generally detectable in upper respiratory specimens during the acute phase of infection. The lowest concentration of SARS-CoV-2 viral copies this assay can detect is 138 copies/mL. A negative result does not preclude SARS-Cov-2 infection and should not be used as the sole basis for treatment or other  patient management decisions. A negative result may occur with  improper specimen collection/handling, submission of specimen other than nasopharyngeal swab, presence of viral mutation(s) within the areas targeted by this assay, and inadequate number of viral copies(<138 copies/mL). A negative result must be combined with clinical observations, patient history, and epidemiological information. The expected result is Negative.  Fact Sheet for Patients:  EntrepreneurPulse.com.au  Fact Sheet for Healthcare Providers:  IncredibleEmployment.be  This test is no t yet approved or cleared by the [place] FDA and  has been authorized for detection and/or diagnosis of SARS-CoV-2 by FDA under an Emergency Use Authorization (EUA). This EUA will remain  in effect (meaning this test can be used) for the duration of the COVID-19 declaration under Section 564(b)(1) of the Act, 21 U.S.C.section 360bbb-3(b)(1), unless the authorization is terminated  or revoked sooner.       Influenza A by PCR NEGATIVE NEGATIVE Final   Influenza B by PCR NEGATIVE NEGATIVE Final    Comment: (NOTE) The Xpert Xpress SARS-CoV-2/FLU/RSV plus assay is intended as an aid in the diagnosis of influenza from Nasopharyngeal swab specimens and should not be used as a sole basis for treatment. Nasal washings and aspirates are unacceptable for Xpert Xpress SARS-CoV-2/FLU/RSV testing.  Fact Sheet for Patients: EntrepreneurPulse.com.au  Fact Sheet for Healthcare Providers: IncredibleEmployment.be  This test is not yet approved or cleared by the [place] FDA and has been authorized for detection and/or diagnosis of SARS-CoV-2 by FDA under an Emergency Use Authorization (EUA). This EUA will remain in effect (meaning this test can be used) for the duration of the COVID-19 declaration under Section 564(b)(1) of the Act, 21 U.S.C. section  360bbb-3(b)(1), unless the authorization is terminated or revoked.  Performed at [facility], [place] [address], [place], [place] [address]      Labs: BNP (last 3 results) Recent Labs    08/21/20 1127  BNP 683.4*   Basic Metabolic Panel: Recent Labs  Lab 08/21/20 1629 08/22/20 0104 08/24/20 0327 08/25/20 0052 08/26/20 0110 08/27/20 0034 08/28/20 0014  NA  --    < > 137 135 137 137 139  K  --    < > 3.6 3.6 3.1* 3.5 3.2*  CL  --    < > 103 102 101 101 104  CO2  --    < > 20* 24 25 26 23   GLUCOSE  --    < > 117* 103* 108* 114* 111*  BUN  --    < > 18 21 23 20 18   CREATININE  --    < > 0.98 1.17 1.31* 1.22 1.16  CALCIUM  --    < > 8.8* 8.4* 8.6* 8.5* 8.4*  MG 2.1  --   --   --  2.0  --   --   PHOS 3.3  --   --   --   --   --   --    < > = values in this interval not displayed.   Liver Function Tests: Recent Labs  Lab 08/21/20 1105  AST 26  ALT 20  ALKPHOS 64  BILITOT 1.4*  PROT 6.3*  ALBUMIN 3.5   No results for input(s): LIPASE, AMYLASE in the last 168 hours. No results for input(s): AMMONIA in the last 168 hours. CBC: Recent Labs  Lab 08/21/20 1105 08/22/20 0104 08/24/20 0327 08/25/20 0052 08/26/20 0110 08/27/20 0034 08/28/20 0014  WBC 7.2   < > 8.4 9.0 9.3 8.5 7.1  NEUTROABS 5.3  --   --   --   --   --   --   HGB 11.6*   < > 10.7* 11.1* 12.4* 11.7* 11.1*  HCT 38.1*   < > 34.6* 35.5* 38.2* 37.6* 35.2*  MCV 80.4   < > 78.8* 78.7* 77.2* 77.7* 77.7*  PLT 152   < > 140* 155 189 175 156   < > = values in this interval not displayed.   Cardiac Enzymes: No results for input(s): CKTOTAL, CKMB, CKMBINDEX, TROPONINI in the last 168 hours. BNP: Invalid input(s): POCBNP CBG: No results for input(s): GLUCAP in the last 168 hours. D-Dimer No results for input(s): DDIMER in the last 72 hours. Hgb A1c No results for input(s): HGBA1C in the last 72 hours. Lipid Profile No results for input(s): CHOL, HDL, LDLCALC, TRIG, CHOLHDL, LDLDIRECT in the last 72  hours. Thyroid function studies No results for input(s): TSH, T4TOTAL, T3FREE, THYROIDAB in the last 72 hours.  Invalid input(s): FREET3 Anemia work up No results for input(s): VITAMINB12, FOLATE, FERRITIN, TIBC, IRON, RETICCTPCT in the last 72 hours. Urinalysis    Component Value Date/Time   COLORURINE YELLOW 10/23/2016 1430   APPEARANCEUR CLEAR 10/23/2016 1430   LABSPEC 1.014 10/23/2016 1430   PHURINE 5.0 10/23/2016 1430   GLUCOSEU NEGATIVE 10/23/2016 1430   HGBUR NEGATIVE 10/23/2016 1430   BILIRUBINUR NEGATIVE 10/23/2016 1430   KETONESUR NEGATIVE 10/23/2016 1430   PROTEINUR NEGATIVE 10/23/2016 1430   NITRITE NEGATIVE 10/23/2016 1430   LEUKOCYTESUR NEGATIVE 10/23/2016 1430   Sepsis Labs Invalid input(s): PROCALCITONIN,  WBC,  LACTICIDVEN Microbiology Recent Results (from the past 240 hour(s))  Resp Panel by RT-PCR (Flu A&B, Covid) Nasopharyngeal Swab     Status: None   Collection Time: 08/21/20  2:18 PM   Specimen: Nasopharyngeal Swab; Nasopharyngeal(NP) swabs in vial transport medium  Result Value Ref Range Status   SARS Coronavirus 2 by RT PCR NEGATIVE NEGATIVE Final    Comment: (NOTE) SARS-CoV-2 target nucleic acids are NOT DETECTED.  The SARS-CoV-2 RNA is generally detectable in upper respiratory specimens during the acute phase of infection. The lowest concentration of SARS-CoV-2 viral copies this assay can detect is 138 copies/mL. A negative result does not preclude SARS-Cov-2 infection and should not be used as the sole basis for treatment or other patient management decisions. A negative result may occur with  improper specimen collection/handling, submission of specimen other than nasopharyngeal swab, presence of viral mutation(s) within the  areas targeted by this assay, and inadequate number of viral copies(<138 copies/mL). A negative result must be combined with clinical observations, patient history, and epidemiological information. The expected result is  Negative.  Fact Sheet for Patients:  EntrepreneurPulse.com.au  Fact Sheet for Healthcare Providers:  IncredibleEmployment.be  This test is no t yet approved or cleared by the [place] FDA and  has been authorized for detection and/or diagnosis of SARS-CoV-2 by FDA under an Emergency Use Authorization (EUA). This EUA will remain  in effect (meaning this test can be used) for the duration of the COVID-19 declaration under Section 564(b)(1) of the Act, 21 U.S.C.section 360bbb-3(b)(1), unless the authorization is terminated  or revoked sooner.       Influenza A by PCR NEGATIVE NEGATIVE Final   Influenza B by PCR NEGATIVE NEGATIVE Final    Comment: (NOTE) The Xpert Xpress SARS-CoV-2/FLU/RSV plus assay is intended as an aid in the diagnosis of influenza from Nasopharyngeal swab specimens and should not be used as a sole basis for treatment. Nasal washings and aspirates are unacceptable for Xpert Xpress SARS-CoV-2/FLU/RSV testing.  Fact Sheet for Patients: EntrepreneurPulse.com.au  Fact Sheet for Healthcare Providers: IncredibleEmployment.be  This test is not yet approved or cleared by the [place] FDA and has been authorized for detection and/or diagnosis of SARS-CoV-2 by FDA under an Emergency Use Authorization (EUA). This EUA will remain in effect (meaning this test can be used) for the duration of the COVID-19 declaration under Section 564(b)(1) of the Act, 21 U.S.C. section 360bbb-3(b)(1), unless the authorization is terminated or revoked.  Performed at [facility], [place] [address] [place], [place] [address]      Time coordinating discharge: Over 30 minutes  SIGNED:   Febe Champa J [place] DO  [facility] 08/28/2020, 10:19 AM

## 2020-08-28 NOTE — Telephone Encounter (Signed)
TOC appt per Roby Lofts on 09/10/20 at 11:15am with Truitt Merle.

## 2020-08-28 NOTE — Progress Notes (Addendum)
Progress Note  Patient Name: Tony Roberts Date of Encounter: 08/28/2020  [facility] Cardiologist: Dr Radford Pax  Subjective   No CP or dyspnea  Inpatient Medications    Scheduled Meds: . apixaban  5 mg Oral BID  . famotidine  40 mg Oral QHS  . ferrous sulfate  325 mg Oral BID WC  . fluticasone furoate-vilanterol  1 puff Inhalation Daily  . furosemide  40 mg Intravenous Daily  . metoprolol tartrate  75 mg Oral BID  . montelukast  10 mg Oral QHS  . pantoprazole  40 mg Oral Daily  . potassium chloride  40 mEq Oral Q3H  . sodium chloride flush  3 mL Intravenous Q12H  . spironolactone  12.5 mg Oral Daily   Continuous Infusions: . sodium chloride 10 mL/hr at 08/27/20 2300  . amiodarone 30 mg/hr (08/28/20 0404)  . lactated ringers 10 mL/hr at 08/26/20 1800   PRN Meds: sodium chloride, acetaminophen, levalbuterol, morphine injection, ondansetron (ZOFRAN) IV, sodium chloride flush, traMADol, triamcinolone   Vital Signs    Vitals:   08/27/20 2327 08/28/20 0300 08/28/20 0403 08/28/20 0427  BP: 140/87  (!) 142/86   Pulse: 67 65 68   Resp: 18 19 18    Temp: 98.3 F (36.8 C)  98 F (36.7 C)   TempSrc: Oral  Oral   SpO2: 94% 92% 96%   Weight:    83.1 kg  Height:        Intake/Output Summary (Last 24 hours) at 08/28/2020 6948 Last data filed at 08/27/2020 2300 Gross per 24 hour  Intake 1152.02 ml  Output 1775 ml  Net -622.98 ml   Last 3 Weights 08/28/2020 08/27/2020 08/27/2020  Weight (lbs) 183 lb 3.2 oz 184 lb 184 lb 15.5 oz  Weight (kg) 83.1 kg 83.462 kg 83.9 kg      Telemetry    Sinus with PACs and PVCs- Personally Reviewed   Physical Exam   GEN: NAD. WD WN  Neck: supple, no JVD Cardiac: RRR Respiratory: CTA; no wheeze GI: Soft, NT/ND, no masses MS: No edema; LUE in sling Neuro: No focal findings Psych: Normal affect   Labs    High Sensitivity Troponin:   Recent Labs  Lab 08/21/20 1105 08/21/20 1343  TROPONINIHS 10 10      Chemistry Recent  Labs  Lab 08/21/20 1105 08/22/20 0104 08/26/20 0110 08/27/20 0034 08/28/20 0014  NA 139   < > 137 137 139  K 3.4*   < > 3.1* 3.5 3.2*  CL 105   < > 101 101 104  CO2 21*   < > 25 26 23   GLUCOSE 135*   < > 108* 114* 111*  BUN 10   < > 23 20 18   CREATININE 1.10   < > 1.31* 1.22 1.16  CALCIUM 8.8*   < > 8.6* 8.5* 8.4*  PROT 6.3*  --   --   --   --   ALBUMIN 3.5  --   --   --   --   AST 26  --   --   --   --   ALT 20  --   --   --   --   ALKPHOS 64  --   --   --   --   BILITOT 1.4*  --   --   --   --   GFRNONAA >60   < > 55* >60 >60  ANIONGAP 13   < > 11 10  12   < > = values in this interval not displayed.     Hematology Recent Labs  Lab 08/26/20 0110 08/27/20 0034 08/28/20 0014  WBC 9.3 8.5 7.1  RBC 4.95 4.84 4.53  HGB 12.4* 11.7* 11.1*  HCT 38.2* 37.6* 35.2*  MCV 77.2* 77.7* 77.7*  MCH 25.1* 24.2* 24.5*  MCHC 32.5 31.1 31.5  RDW 16.2* 16.0* 16.1*  PLT 189 175 156    BNP Recent Labs  Lab 08/21/20 1127  BNP 328.4*    Patient Profile     79 y.o. male with past medical history of hypertension, Gastrosoft reflux disease with atrial fibrillation with rapid ventricular response.  Echocardiogram shows ejection fraction 25 to 30%, moderate left ventricular hypertrophy, severe left atrial enlargement, mild right atrial enlargement, mild mitral regurgitation, mild aortic insufficiency and mildly dilated ascending aorta.  Also with anemia and shoulder injury.  Assessment & Plan    1 atrial fibrillation with rapid ventricular response-patient is now in sinus rhythm status post TEE guided cardioversion.  Discontinue IV amiodarone and begin amiodarone 200 mg daily.  Continue metoprolol but changed to Toprol 100 mg daily.  Continue apixaban.  2 cardiomyopathy-felt to be possibly tachycardia mediated.  Hopefully this will improve now that sinus has been reestablished.  Change metoprolol to Toprol 100 mg daily.  Add losartan 25 mg daily.  Could transition to [facility] as an  outpatient.  Plan repeat echocardiogram in 3 months.  Hopefully LV function will have improved.  However if not would need ischemia evaluation at that time.    3 nonsustained ventricular tachycardia-previously noted on telemetry.  Continue beta-blocker and amiodarone.  4 anemia-continue Protonix.  Follow-up gastroenterology as an outpatient.  5 acute systolic congestive heart failure-change Lasix to 20 mg daily and continue low-dose spironolactone.  Check potassium and renal function 1 week following discharge.  6 shoulder dislocation-Per orthopedics.  7 possible history of TIAs-continue apixaban given newly diagnosed atrial fibrillation.  8 hypokalemia-supplement.  9 mitral regurgitation/aortic insufficiency-patient will need follow-up echoes in the future.  This was noted on transesophageal echocardiogram.  Patient can be discharged from a cardiac standpoint.  Would continue present cardiac medications at discharge.  We will arrange follow-up with APP in 1 to 2 weeks.  Follow-up Dr. Radford Pax 3 months.  Patient needs BMET 1 week following DC. Please call with questions.  For questions or updates, please contact [place] Please consult [email or web address] for contact info under        Signed, Kirk Ruths, MD  08/28/2020, 7:22 AM

## 2020-08-29 NOTE — Telephone Encounter (Signed)
Patient contacted regarding discharge from [facility] on 08/28/20  Patient understands to follow up with provider Truitt Merle, NP on 09/10/20 at 11:15 am at [address].  Patient understands discharge instructions? Yes Patient understands medications and regiment? Yes Patient understands to bring all medications to this visit? Yes   Pt asks about potassium.  Last draw at hospital was 3.2.  He was not sent home w any.  He was given "2 horse pills of potassium yesterday" before discharge.  He is sent home on spironolactone 12.5 mg.  This am he took 25 mg.  Clarified w him.  He will take just half tab daily.  Needs 1 week BMET.  He plans to see his PCP next week and they will check BMET, trying to call them today.  Aware that if he does not get into PCP, he should come to this office for one week blood work to check potassium.

## 2020-09-05 ENCOUNTER — Other Ambulatory Visit: Payer: Self-pay

## 2020-09-05 ENCOUNTER — Ambulatory Visit (INDEPENDENT_AMBULATORY_CARE_PROVIDER_SITE_OTHER): Payer: Medicare Other

## 2020-09-05 ENCOUNTER — Ambulatory Visit: Payer: Self-pay

## 2020-09-05 ENCOUNTER — Ambulatory Visit (INDEPENDENT_AMBULATORY_CARE_PROVIDER_SITE_OTHER): Payer: Medicare Other | Admitting: Orthopaedic Surgery

## 2020-09-05 DIAGNOSIS — S43005D Unspecified dislocation of left shoulder joint, subsequent encounter: Secondary | ICD-10-CM

## 2020-09-05 DIAGNOSIS — M79632 Pain in left forearm: Secondary | ICD-10-CM

## 2020-09-05 NOTE — Progress Notes (Signed)
Office Visit Note   Patient: Tony Roberts           Date of Birth: 1941/07/07           MRN: [id number] Visit Date: 09/05/2020              Requested by: Alroy Dust, L.Marlou Sa, [place] [employer] [place],  [place] [address] PCP: Alroy Dust, L.Marlou Sa, MD   Assessment & Plan: Visit Diagnoses:  1. Dislocation of left shoulder joint, subsequent encounter   2. Left forearm pain     Plan: Impression is little under 2 weeks out left anterior shoulder dislocation.  The patient will continue to wear his sling at all times for the next 4 weeks.  He will follow up with [place] at that point for repeat evaluation, Grashey and scapular Y x-rays.  Follow-Up Instructions: Return in about 4 weeks (around 10/03/2020).   Orders:  Orders Placed This Encounter  Procedures   XR Shoulder Left   XR Forearm Left   No orders of the defined types were placed in this encounter.     Procedures: No procedures performed   Clinical Data: No additional findings.   Subjective: Chief Complaint  Patient presents with   Left Shoulder - Routine Post Op    HPI patient is a very pleasant 79 year old gentleman who comes in today little under 2 weeks out close reduction left shoulder.  Initial injury occurred on 12-21 when he slipped taking a hard fall onto his left side.  He was seen in the ED where it was noted that his left shoulder was anteriorly dislocated.  He was taken to the operating room by Dr. Sherrian Divers on 08/23/2020 for closed reduction.  He has been in a sling since.  He comes in today for further evaluation treatment recommendation.  He has mild discomfort to the shoulder.  He does note moderate pain and swelling to the left forearm.  It does not appear that he had any imaging done at the hospital to his left forearm.  Review of Systems as detailed in HPI.  All others reviewed and are negative.   Objective: Vital Signs: There were no vitals taken for this visit.  Physical Exam well-developed  well-nourished gentleman in no acute distress.  Alert oriented x3.  Ortho Exam left shoulder reveals mild tenderness.  He has full sensation to the deltoid.  He is able to abduct this on his own.  He does have moderate swelling and ecchymosis throughout the forearm.  Mildly increased pain with range of motion.  He is neurovascular intact distally.  Specialty Comments:  No specialty comments available.  Imaging: XR Forearm Left  Result Date: 09/05/2020 No acute or structural abnormalities  XR Shoulder Left  Result Date: 09/05/2020 Anatomic alignment of the left shoulder    PMFS History: Patient Active Problem List   Diagnosis Date Noted   Traumatic closed displaced fracture of left shoulder with anterior dislocation    Radiculopathy 10/29/2016   Left fibular fracture 10/27/2013   Sprain and strain of unspecified site of hip and thigh 05/26/2012   Other specified iron deficiency anemias    Past Medical History:  Diagnosis Date   Arthritis    Asthma    last asthma attack at age 53   GERD (gastroesophageal reflux disease)    Headache    Other specified iron deficiency anemias    PAC (premature atrial contraction)    occasional PAC's    Family History  Problem Relation  Age of Onset   Alzheimer's disease Mother    Asthma Father     Past Surgical History:  Procedure Laterality Date   CARDIOVERSION N/A 08/27/2020   Procedure: CARDIOVERSION;  Surgeon: Dorothy Spark, MD;  Location: [facility] ENDOSCOPY;  Service: Cardiovascular;  Laterality: N/A;   CATARACT EXTRACTION     HERNIA REPAIR     SHOULDER CLOSED REDUCTION Left 08/23/2020   Procedure: CLOSED REDUCTION SHOULDER;  Surgeon: [name], MD;  Location: [place];  Service: Orthopedics;  Laterality: Left;   SPINE SURGERY  10/29/2016   TEE WITHOUT CARDIOVERSION N/A 08/27/2020   Procedure: TRANSESOPHAGEAL ECHOCARDIOGRAM (TEE);  Surgeon: Dorothy Spark, MD;  Location: [facility] ENDOSCOPY;  Service:  Cardiovascular;  Laterality: N/A;   TONSILLECTOMY     Social History   Occupational History   Not on file  Tobacco Use   Smoking status: Never Smoker   Smokeless tobacco: Never Used  Vaping Use   Vaping Use: Never used  Substance and Sexual Activity   Alcohol use: No   Drug use: No   Sexual activity: Not on file

## 2020-09-06 ENCOUNTER — Other Ambulatory Visit: Payer: Self-pay | Admitting: Allergy and Immunology

## 2020-09-06 ENCOUNTER — Other Ambulatory Visit (HOSPITAL_COMMUNITY): Payer: Self-pay | Admitting: Allergy and Immunology

## 2020-09-06 DIAGNOSIS — J4540 Moderate persistent asthma, uncomplicated: Secondary | ICD-10-CM

## 2020-09-10 ENCOUNTER — Encounter: Payer: Self-pay | Admitting: Nurse Practitioner

## 2020-09-10 ENCOUNTER — Ambulatory Visit: Payer: Medicare Other | Admitting: Nurse Practitioner

## 2020-09-10 ENCOUNTER — Other Ambulatory Visit: Payer: Self-pay

## 2020-09-10 VITALS — BP 140/76 | HR 49 | Ht 66.0 in | Wt 183.6 lb

## 2020-09-10 DIAGNOSIS — I480 Paroxysmal atrial fibrillation: Secondary | ICD-10-CM | POA: Diagnosis not present

## 2020-09-10 DIAGNOSIS — I5022 Chronic systolic (congestive) heart failure: Secondary | ICD-10-CM

## 2020-09-10 DIAGNOSIS — Z79899 Other long term (current) drug therapy: Secondary | ICD-10-CM

## 2020-09-10 LAB — BASIC METABOLIC PANEL
BUN/Creatinine Ratio: 13 (ref 10–24)
BUN: 15 mg/dL (ref 8–27)
CO2: 23 mmol/L (ref 20–29)
Calcium: 8.6 mg/dL (ref 8.6–10.2)
Chloride: 104 mmol/L (ref 96–106)
Creatinine, Ser: 1.15 mg/dL (ref 0.76–1.27)
GFR calc Af Amer: 70 mL/min/{1.73_m2} (ref >59)
GFR calc non Af Amer: 60 mL/min/{1.73_m2} (ref >59)
Glucose: 107 mg/dL — ABNORMAL HIGH (ref 65–99)
Potassium: 4 mmol/L (ref 3.5–5.2)
Sodium: 140 mmol/L (ref 134–144)

## 2020-09-10 LAB — CBC
Hematocrit: 40 % (ref 37.5–51.0)
Hemoglobin: 12.7 g/dL — ABNORMAL LOW (ref 13.0–17.7)
MCH: 26.1 pg — ABNORMAL LOW (ref 26.6–33.0)
MCHC: 31.8 g/dL (ref 31.5–35.7)
MCV: 82 fL (ref 79–97)
Platelets: 191 10*3/uL (ref 150–450)
RBC: 4.87 x10E6/uL (ref 4.14–5.80)
RDW: 18.1 % — ABNORMAL HIGH (ref 11.6–15.4)
WBC: 6.4 10*3/uL (ref 3.4–10.8)

## 2020-09-10 MED ORDER — METOPROLOL SUCCINATE ER 50 MG PO TB24: 50.0000 mg | ORAL_TABLET | Freq: Every day | ORAL | 3 refills | Status: DC

## 2020-09-10 MED ORDER — FUROSEMIDE 20 MG PO TABS: 20.0000 mg | ORAL_TABLET | Freq: Every day | ORAL | 0 refills | Status: DC | PRN

## 2020-09-10 NOTE — Patient Instructions (Addendum)
After Visit Summary:  We will be checking the following labs today - BMET and CBC   Medication Instructions:    Continue with your current medicines. BUT  I am changing the Furosemide to just "as needed" - this means only take for weight gain of 2 or more pounds overnight.  I am cutting the Toprol back to 50 mg a day - this is at your pharmacy. This should help your HR come up.   Try to not take the Aleve.    If you need a refill on your cardiac medications before your next appointment, please call your pharmacy.     Testing/Procedures To Be Arranged:  N/A  Follow-Up:   See Dr. Radford Pax in about 2 weeks with EKG    At [facility], you and your health needs are our priority.  As part of our continuing mission to provide you with exceptional heart care, we have created designated Provider Care Teams.  These Care Teams include your primary Cardiologist (physician) and Advanced Practice Providers (APPs -  Physician Assistants and Nurse Practitioners) who all work together to provide you with the care you need, when you need it.  Special Instructions:  . Stay safe, wash your hands for at least 20 seconds and wear a mask when needed.  . It was good to talk with you today.  . Weigh every morning.    Call the [place] office at [phone] if you have any questions, problems or concerns.

## 2020-09-23 MED FILL — FAMOTIDINE 40 MG TABLET: 40 | 90 days supply | Qty: 90 | Fill #0

## 2020-10-01 NOTE — Progress Notes (Signed)
Cardiology Office Note:    Date:  10/03/2020   ID:  Tony Roberts, DOB [birth year], MRN [id number]  PCP:  Tony Graff.Marlou Sa, MD  Cardiologist:  No primary care provider on file.    Referring MD: Tony Roberts, L.Marlou Sa, MD   No chief complaint on file.   History of Present Illness:    Tony Roberts is a 80 y.o. male with a hx of HTN, GERD, asthma, & diverticulosis. He was hospitalized in early Dec 2022 with 4-5 day hx of palpitations, DOE, cough and was found to be in afib with RVR by his allergist and sent to the ER.  He also had had 3 TIAs in the month prior, each lasting 30 min with one-sided numbness and facial droop with speech problems and spontaneously resolved.  MRI done 11/15 showed no acute CVA but small chronic infarct in the left cerebellum. He was started on IV Cardizem and IV Amio for rate control in the ER and admitted to [facility] service.  He underwent TEE/DCCV to NSR and transitioned to PO Amio 200mg  daily.  He was continued on Toprol and started on Eliquis 5mg  BID.  2D echo 08/22/2020 showed severe LV dysfunction with EF 25-30% felt to possibly be due to tachy mediated DCM with  LVF with severe LAE and mild AR by TTE.  TEE showed a smsall PFO with L>R shunting and moderate AR.   He was seen back by Tony Merle, NP and was doing well.  Maintaining sinus but HR slow in the 40's and Toprol was cut back to 50mg  daily.  He was continued on spiro, ARB, Lasix.  He is here today for followup and is doing well.  He denies any chest pain or pressure, SOB, DOE, PND, orthopnea, LE edema, dizziness, palpitations or syncope. He is compliant with his meds and is tolerating meds with no SE.    Past Medical History:  Diagnosis Date  . Arthritis   . Asthma    last asthma attack at age 27  . Dilated cardiomyopathy ([place])    likely tachy mediated from atrial fibrillation  . GERD (gastroesophageal reflux disease)   . Headache   . Other specified iron deficiency anemias   . PAC (premature atrial  contraction)    occasional PAC's  . PAF (paroxysmal atrial fibrillation) ([place])    s/p TEE/DCCV 08/2020    Past Surgical History:  Procedure Laterality Date  . CARDIOVERSION N/A 08/27/2020   Procedure: CARDIOVERSION;  Surgeon: Dorothy Spark, MD;  Location: [facility] ENDOSCOPY;  Service: Cardiovascular;  Laterality: N/A;  . CATARACT EXTRACTION    . HERNIA REPAIR    . SHOULDER CLOSED REDUCTION Left 08/23/2020   Procedure: CLOSED REDUCTION SHOULDER;  Surgeon: [name], MD;  Location: [place];  Service: Orthopedics;  Laterality: Left;  . SPINE SURGERY  10/29/2016  . TEE WITHOUT CARDIOVERSION N/A 08/27/2020   Procedure: TRANSESOPHAGEAL ECHOCARDIOGRAM (TEE);  Surgeon: Dorothy Spark, MD;  Location: [facility] ENDOSCOPY;  Service: Cardiovascular;  Laterality: N/A;  . TONSILLECTOMY      Current Medications: Current Meds  Medication Sig  . ADVAIR DISKUS 250-50 MCG/DOSE AEPB INHALE 1 PUFF BY MOUTH ONCE TO TWICE A DAY TO PREVENT COUGH OR WHEEZE. RINSE, GARGLE, AND SPIT AFTER USE.  Marland Kitchen albuterol (PROVENTIL HFA) 108 (90 Base) MCG/ACT inhaler Inhale two puffs every four to six hours as needed for cough or wheeze.  Marland Kitchen amiodarone (PACERONE) 200 MG tablet Take 1 tablet (200 mg total) by mouth daily.  Marland Kitchen apixaban (  ELIQUIS) 5 MG TABS tablet Take 1 tablet (5 mg total) by mouth 2 (two) times daily.  Marland Kitchen esomeprazole (NEXIUM) 40 MG capsule Take 40 mg by mouth See admin instructions. Take 40 mg by mouth in the morning before breakfast and an additional 40 mg once a day as needed for recurring heartburn  . famotidine (PEPCID) 40 MG tablet Take 40 mg by mouth at bedtime.   . ferrous sulfate 325 (65 FE) MG tablet Take 1 tablet (325 mg total) by mouth 2 (two) times daily with a meal.  . losartan (COZAAR) 25 MG tablet Take 1 tablet (25 mg total) by mouth daily.  . metoprolol succinate (TOPROL-XL) 50 MG 24 hr tablet Take 1 tablet (50 mg total) by mouth daily. Take with or immediately following a meal.  . montelukast  (SINGULAIR) 10 MG tablet TAKE 1 TABLET (10 MG TOTAL) BY MOUTH AT BEDTIME.  Marland Kitchen spironolactone (ALDACTONE) 25 MG tablet Take 0.5 tablets (12.5 mg total) by mouth daily.  Marland Kitchen triamcinolone (NASACORT) 55 MCG/ACT AERO nasal inhaler Place 1 spray into the nose daily as needed (for allergies or rhinitis).      Allergies:   Patient has no known allergies.   Social History   Socioeconomic History  . Marital status: Married    Spouse name: Not on file  . Number of children: Not on file  . Years of education: Not on file  . Highest education level: Not on file  Occupational History  . Not on file  Tobacco Use  . Smoking status: Never Smoker  . Smokeless tobacco: Never Used  Vaping Use  . Vaping Use: Never used  Substance and Sexual Activity  . Alcohol use: No  . Drug use: No  . Sexual activity: Not on file  Other Topics Concern  . Not on file  Social History Narrative  . Not on file   Social Determinants of Health   Financial Resource Strain: Not on file  Food Insecurity: Not on file  Transportation Needs: Not on file  Physical Activity: Not on file  Stress: Not on file  Social Connections: Not on file     Family History: The patient's family history includes Alzheimer's disease in his mother; Asthma in his father.  ROS:   Please see the history of present illness.    ROS  All other systems reviewed and negative.   EKGs/Labs/Other Studies Reviewed:    The following studies were reviewed today:  TEE 08/27/2020 IMPRESSIONS   1. Left ventricular ejection fraction, by estimation, is 20 to 25%. The  left ventricle has severely decreased function. The left ventricle  demonstrates global hypokinesis. The left ventricular internal cavity size  was mildly dilated. Left ventricular  diastolic function could not be evaluated.  2. Right ventricular systolic function is normal. The right ventricular  size is normal.  3. Left atrial size was severely dilated. No left atrial/left  atrial  appendage thrombus was detected.  4. Right atrial size was severely dilated.  5. The mitral valve is degenerative. Mild to moderate mitral valve  regurgitation. No evidence of mitral stenosis.  6. The aortic valve is tricuspid. Aortic valve regurgitation is moderate.  No aortic stenosis is present.  7. The inferior vena cava is normal in size with greater than 50%  respiratory variability, suggesting right atrial pressure of 3 mmHg.  8. Evidence of atrial level shunting detected by color flow Doppler.  There is a small patent foramen ovale with predominantly left to right  shunting across the atrial septum.   2D echo 08/22/2020 IMPRESSIONS   1. Left ventricular ejection fraction, by estimation, is 25 to 30%. The  left ventricle has severely decreased function. The left ventricle  demonstrates global hypokinesis. There is moderate left ventricular  hypertrophy of the basal-septal segment. Left  ventricular diastolic function could not be evaluated.  2. Right ventricular systolic function is normal. The right ventricular  size is normal. Tricuspid regurgitation signal is inadequate for assessing  PA pressure.  3. Left atrial size was severely dilated.  4. Right atrial size was mildly dilated.  5. The mitral valve is normal in structure. Mild mitral valve  regurgitation. No evidence of mitral stenosis.  6. The aortic valve is tricuspid. Aortic valve regurgitation is mild.  Mild aortic valve sclerosis is present, with no evidence of aortic valve  stenosis.  7. Aortic dilatation noted. There is mild dilatation of the ascending  aorta, measuring 40 mm.  8. The inferior vena cava is normal in size with greater than 50%  respiratory variability, suggesting right atrial pressure of 3 mmHg.   EKG:  EKG is  ordered today.  The ekg ordered today demonstrates atrial fibrillation at 90bpm with nonspecific T wave abnormality  Recent Labs: 08/21/2020: ALT 20; B Natriuretic  Peptide 328.4; TSH 4.301 08/26/2020: Magnesium 2.0 09/10/2020: BUN 15; Creatinine, Ser 1.15; Hemoglobin 12.7; Platelets 191; Potassium 4.0; Sodium 140   Recent Lipid Panel    Component Value Date/Time   CHOL 187 08/21/2020 1629   TRIG 64 08/21/2020 1629   HDL 37 (L) 08/21/2020 1629   CHOLHDL 5.1 08/21/2020 1629   VLDL 13 08/21/2020 1629   LDLCALC 137 (H) 08/21/2020 1629    CHA2DS2-VASc Score = 5 [CHF History: No, HTN History: Yes, Diabetes History: No, Stroke History: Yes, Vascular Disease History: No, Age Score: 2, Gender Score: 0].  Therefore, the patient's annual risk of stroke is 7.2 %.        Physical Exam:    VS:  BP 136/88   Pulse 90   Ht 5\' 6"  (1.676 m)   Wt 184 lb (83.5 kg)   BMI 29.70 kg/m     Wt Readings from Last 3 Encounters:  10/03/20 184 lb (83.5 kg)  10/02/20 184 lb (83.5 kg)  09/10/20 183 lb 9.6 oz (83.3 kg)     GEN:  Well nourished, well developed in no acute distress HEENT: Normal NECK: No JVD; No carotid bruits LYMPHATICS: No lymphadenopathy CARDIAC: irregularly irregular, no murmurs, rubs, gallops RESPIRATORY:  Clear to auscultation without rales, wheezing or rhonchi  ABDOMEN: Soft, non-tender, non-distended MUSCULOSKELETAL:  No edema; No deformity  SKIN: Warm and dry NEUROLOGIC:  Alert and oriented x 3 PSYCHIATRIC:  Normal affect   ASSESSMENT:    1. PAF (paroxysmal atrial fibrillation) ([place])   2. Chronic systolic heart failure ([place])   3. TIA (transient ischemic attack)   4. NSVT (nonsustained ventricular tachycardia) ([facility])    PLAN:    In order of problems listed above:  1.  Persistent atrial fibrillation -S/P TEE/DCCV and on Amio -Toprol cut back to 50mg  daily on last OV due to significant bradycardia -he is back in atrial fibrillation with fairly adequate HR control -he is asymptomatic -he is very disappointed that his NSR did not hold after DCCV -he has not missed any doses of Eliquis>>continue 5mg  BID -will refer to Afib clinic  for further guidance -continue current dose of Amio 200mg  daily>>consider amio level and if subtherapeutic then reload and  repeat DCCV -He has hx of tachy mediated DCM and would like to reestablish sinus rhythm>>referring to afib clinic -SCr 1.15 and Hbg 12.7 in Dec 2021  2.  Chronic systolic CHF -EF [id number] on echo and presumed tachy mediated -plan was treatment of CHF with GDMT after restoring NSR from TEE/DCCV and then repeat 2D echo in 2 weeks -now back in afib and would like to restore NSR>>sending to afib clinic -continue Toprol XL 50mg  daily, spiro 12.5mg  daily -would benefit from changing from Losartan 25mg  daily to [facility] but patient does not want to at this time. -he does not appear volume overloaded on exam today.  He has not required any diuretics except for spiro -repeat 2D echo once back in NSR and on max GMDT for 2 months to see if EF has improved.  If no improvement then will need ischemic workup  3.  Hx of TIA -likely related to #1 -continue Eliquis  4.  NSVT -controlled on BB and Amio  Medication Adjustments/Labs and Tests Ordered: Current medicines are reviewed at length with the patient today.  Concerns regarding medicines are outlined above.  Orders Placed This Encounter  Procedures  . EKG 12-Lead   No orders of the defined types were placed in this encounter.   Signed, Fransico Him, MD  10/03/2020 10:29 PM    [place]

## 2020-10-02 ENCOUNTER — Encounter: Payer: Self-pay | Admitting: Cardiology

## 2020-10-02 ENCOUNTER — Ambulatory Visit: Payer: Medicare Other | Admitting: Cardiology

## 2020-10-02 ENCOUNTER — Other Ambulatory Visit: Payer: Self-pay

## 2020-10-02 VITALS — BP 136/88 | HR 90 | Ht 66.0 in | Wt 184.0 lb

## 2020-10-02 DIAGNOSIS — I4729 Other ventricular tachycardia: Secondary | ICD-10-CM

## 2020-10-02 DIAGNOSIS — I5022 Chronic systolic (congestive) heart failure: Secondary | ICD-10-CM

## 2020-10-02 DIAGNOSIS — I472 Ventricular tachycardia: Secondary | ICD-10-CM

## 2020-10-02 DIAGNOSIS — G459 Transient cerebral ischemic attack, unspecified: Secondary | ICD-10-CM

## 2020-10-02 DIAGNOSIS — I480 Paroxysmal atrial fibrillation: Secondary | ICD-10-CM | POA: Diagnosis not present

## 2020-10-02 NOTE — Patient Instructions (Signed)
Medication Instructions:  Your physician recommends that you continue on your current medications as directed. Please refer to the Current Medication list given to you today.  *If you need a refill on your cardiac medications before your next appointment, please call your pharmacy*  Follow-Up: At [facility], you and your health needs are our priority.  As part of our continuing mission to provide you with exceptional heart care, we have created designated Provider Care Teams.  These Care Teams include your primary Cardiologist (physician) and Advanced Practice Providers (APPs -  Physician Assistants and Nurse Practitioners) who all work together to provide you with the care you need, when you need it.  AFIB CLINIC INFORMATION: Your appointment is scheduled on: January 18th, 2022 at 1:30pm The AFib Clinic is located in the Heart and Vascular Specialty Clinics at [facility] Parking instructions/directions: [address] (off [address]). When you pull in to Entrance C, there is an underground parking garage to your right. The code to enter the garage is 1111. Take the elevators to the first floor. Follow the signs to the Heart and Vascular Specialty Clinics. You will see registration at the end of the hallway.  Phone number: [phone]

## 2020-10-03 ENCOUNTER — Encounter: Payer: Self-pay | Admitting: Cardiology

## 2020-10-03 ENCOUNTER — Ambulatory Visit: Payer: Medicare Other | Admitting: Orthopaedic Surgery

## 2020-10-03 ENCOUNTER — Ambulatory Visit (INDEPENDENT_AMBULATORY_CARE_PROVIDER_SITE_OTHER): Payer: Medicare Other

## 2020-10-03 ENCOUNTER — Encounter: Payer: Self-pay | Admitting: Orthopaedic Surgery

## 2020-10-03 VITALS — Ht 66.0 in | Wt 184.0 lb

## 2020-10-03 DIAGNOSIS — S43005D Unspecified dislocation of left shoulder joint, subsequent encounter: Secondary | ICD-10-CM | POA: Diagnosis not present

## 2020-10-03 DIAGNOSIS — I480 Paroxysmal atrial fibrillation: Secondary | ICD-10-CM | POA: Insufficient documentation

## 2020-10-03 DIAGNOSIS — I420 Dilated cardiomyopathy: Secondary | ICD-10-CM | POA: Insufficient documentation

## 2020-10-03 NOTE — Progress Notes (Signed)
Office Visit Note   Patient: Tony Roberts           Date of Birth: 26-Nov-1940           MRN: [id number] Visit Date: 10/03/2020              Requested by: Alroy Dust, L.Marlou Sa, [address] [employer] [address] [place],  [place] [address] PCP: Alroy Dust, L.Marlou Sa, MD   Assessment & Plan: Visit Diagnoses:  1. Dislocation of left shoulder joint, subsequent encounter     Plan: Impression is 6 weeks status post closed reduction left anterior shoulder dislocation.  The patient is doing well clinically.  We will allow him to discontinue his sling.  We will start him in formal physical therapy and a prescription was provided today to the patient.  He will follow-up with [place] in 6 weeks time for recheck.  Call with concerns or questions.  Follow-Up Instructions: Return in about 6 weeks (around 11/14/2020).   Orders:  Orders Placed This Encounter  Procedures  . XR Shoulder Left   No orders of the defined types were placed in this encounter.     Procedures: No procedures performed   Clinical Data: No additional findings.   Subjective: Chief Complaint  Patient presents with  . Left Shoulder - Follow-up    F/u from anterior shoulder dislocation    HPI patient is a pleasant 80 year old gentleman who comes in today 6 weeks out closed reduction left anterior shoulder dislocation.  He has been doing well.  He does have mild pain to the anterior shoulder at times.  He has been compliant wearing his sling.  Review of Systems as detailed in HPI.  All others reviewed and are negative.  Objective: Vital Signs: Ht 5\' 6"  (1.676 m)   Wt 184 lb (83.5 kg)   BMI 29.70 kg/m   Physical Exam well-developed well-nourished gentleman in no acute distress.  Alert and oriented x3.  Ortho Exam left shoulder reveals active forward flexion to about 45 degrees.  He is neurovascular intact distally.  Specialty Comments:  No specialty comments available.  Imaging: XR Shoulder Left  Result Date:  10/03/2020 Stable reduction of the left shoulder.  He does have superior migration of the humeral head.    PMFS History: Patient Active Problem List   Diagnosis Date Noted  . Traumatic closed displaced fracture of left shoulder with anterior dislocation   . Radiculopathy 10/29/2016  . Left fibular fracture 10/27/2013  . Sprain and strain of unspecified site of hip and thigh 05/26/2012  . Other specified iron deficiency anemias    Past Medical History:  Diagnosis Date  . Arthritis   . Asthma    last asthma attack at age 11  . GERD (gastroesophageal reflux disease)   . Headache   . Other specified iron deficiency anemias   . PAC (premature atrial contraction)    occasional PAC's    Family History  Problem Relation Age of Onset  . Alzheimer's disease Mother   . Asthma Father     Past Surgical History:  Procedure Laterality Date  . CARDIOVERSION N/A 08/27/2020   Procedure: CARDIOVERSION;  Surgeon: Dorothy Spark, MD;  Location: [facility] ENDOSCOPY;  Service: Cardiovascular;  Laterality: N/A;  . CATARACT EXTRACTION    . HERNIA REPAIR    . SHOULDER CLOSED REDUCTION Left 08/23/2020   Procedure: CLOSED REDUCTION SHOULDER;  Surgeon: [name], MD;  Location: [place];  Service: Orthopedics;  Laterality: Left;  . SPINE SURGERY  10/29/2016  . TEE WITHOUT CARDIOVERSION N/A 08/27/2020   Procedure: TRANSESOPHAGEAL ECHOCARDIOGRAM (TEE);  Surgeon: Dorothy Spark, MD;  Location: [facility] ENDOSCOPY;  Service: Cardiovascular;  Laterality: N/A;  . TONSILLECTOMY     Social History   Occupational History  . Not on file  Tobacco Use  . Smoking status: Never Smoker  . Smokeless tobacco: Never Used  Vaping Use  . Vaping Use: Never used  Substance and Sexual Activity  . Alcohol use: No  . Drug use: No  . Sexual activity: Not on file

## 2020-10-08 ENCOUNTER — Other Ambulatory Visit: Payer: Self-pay

## 2020-10-08 ENCOUNTER — Encounter (HOSPITAL_COMMUNITY): Payer: Self-pay | Admitting: Nurse Practitioner

## 2020-10-08 ENCOUNTER — Ambulatory Visit (HOSPITAL_COMMUNITY)
Admission: RE | Admit: 2020-10-08 | Discharge: 2020-10-08 | Disposition: A | Discharge status: Home / Self Care | Payer: Medicare Other | Source: Ambulatory Visit | Attending: Nurse Practitioner | Admitting: Nurse Practitioner

## 2020-10-08 VITALS — BP 134/74 | HR 57 | Ht 66.0 in | Wt 183.0 lb

## 2020-10-08 DIAGNOSIS — I351 Nonrheumatic aortic (valve) insufficiency: Secondary | ICD-10-CM | POA: Diagnosis not present

## 2020-10-08 DIAGNOSIS — D6869 Other thrombophilia: Secondary | ICD-10-CM

## 2020-10-08 DIAGNOSIS — I4819 Other persistent atrial fibrillation: Secondary | ICD-10-CM | POA: Insufficient documentation

## 2020-10-08 DIAGNOSIS — Z7901 Long term (current) use of anticoagulants: Secondary | ICD-10-CM | POA: Diagnosis not present

## 2020-10-08 DIAGNOSIS — I480 Paroxysmal atrial fibrillation: Secondary | ICD-10-CM

## 2020-10-08 DIAGNOSIS — I10 Essential (primary) hypertension: Secondary | ICD-10-CM | POA: Insufficient documentation

## 2020-10-08 NOTE — Progress Notes (Signed)
Primary Care Physician: Alroy Dust, L.Marlou Sa, MD Referring Physician: Dr. Ernie Avena is a 80 y.o. male with a h/o HTN, GERD, asthma and  paroxysmal afib that presented  to [facility] by his allergist for new onset afib with RVR, 12/1 to 08/28/20.  Marland Kitchen He had 3 TIA's the previous month. He was anticoagulated and placed on amiodarone.his BB was reduced in SR for the presence of bradycardia.  He was seen by Dr. Radford Pax in f/u, 10/02/20 and was found in afib, rate controlled at 90 bpm. Pt was unaware.He was referred to the afib clinic. His heart rates the next day were back to the 50's. He feels well. He does not smoke or use  alcohol but had been cutting back on Dr. Malachi Bonds. He has severely dilated atrium so I doubt he would be an ablation candidate. He is in SR today.   Today, he denies symptoms of palpitations, chest pain, shortness of breath, orthopnea, PND, lower extremity edema, dizziness, presyncope, syncope, or neurologic sequela. The patient is tolerating medications without difficulties and is otherwise without complaint today.   Past Medical History:  Diagnosis Date  . Arthritis   . Asthma    last asthma attack at age 63  . Dilated cardiomyopathy ([place])    likely tachy mediated from atrial fibrillation  . GERD (gastroesophageal reflux disease)   . Headache   . Other specified iron deficiency anemias   . PAC (premature atrial contraction)    occasional PAC's  . PAF (paroxysmal atrial fibrillation) ([place])    s/p TEE/DCCV 08/2020   Past Surgical History:  Procedure Laterality Date  . CARDIOVERSION N/A 08/27/2020   Procedure: CARDIOVERSION;  Surgeon: Dorothy Spark, MD;  Location: [facility] ENDOSCOPY;  Service: Cardiovascular;  Laterality: N/A;  . CATARACT EXTRACTION    . HERNIA REPAIR    . SHOULDER CLOSED REDUCTION Left 08/23/2020   Procedure: CLOSED REDUCTION SHOULDER;  Surgeon: [name], MD;  Location: [place];  Service: Orthopedics;  Laterality: Left;  . SPINE SURGERY  10/29/2016   . TEE WITHOUT CARDIOVERSION N/A 08/27/2020   Procedure: TRANSESOPHAGEAL ECHOCARDIOGRAM (TEE);  Surgeon: Dorothy Spark, MD;  Location: [facility] ENDOSCOPY;  Service: Cardiovascular;  Laterality: N/A;  . TONSILLECTOMY      Current Outpatient Medications  Medication Sig Dispense Refill  . ADVAIR DISKUS 250-50 MCG/DOSE AEPB INHALE 1 PUFF BY MOUTH ONCE TO TWICE A DAY TO PREVENT COUGH OR WHEEZE. RINSE, GARGLE, AND SPIT AFTER USE. 180 each 1  . albuterol (PROVENTIL HFA) 108 (90 Base) MCG/ACT inhaler Inhale two puffs every four to six hours as needed for cough or wheeze. 1 Inhaler 1  . amiodarone (PACERONE) 200 MG tablet Take 1 tablet (200 mg total) by mouth daily. 90 tablet 0  . apixaban (ELIQUIS) 5 MG TABS tablet Take 1 tablet (5 mg total) by mouth 2 (two) times daily. 180 tablet 0  . esomeprazole (NEXIUM) 40 MG capsule Take 40 mg by mouth See admin instructions. Take 40 mg by mouth in the morning before breakfast and an additional 40 mg once a day as needed for recurring heartburn    . famotidine (PEPCID) 40 MG tablet Take 40 mg by mouth at bedtime.     . ferrous sulfate 325 (65 FE) MG tablet Take 1 tablet (325 mg total) by mouth 2 (two) times daily with a meal. 180 tablet 0  . losartan (COZAAR) 25 MG tablet Take 1 tablet (25 mg total) by mouth daily. 90 tablet  0  . metoprolol succinate (TOPROL-XL) 50 MG 24 hr tablet Take 1 tablet (50 mg total) by mouth daily. Take with or immediately following a meal. 90 tablet 3  . montelukast (SINGULAIR) 10 MG tablet TAKE 1 TABLET (10 MG TOTAL) BY MOUTH AT BEDTIME. 90 tablet 0  . spironolactone (ALDACTONE) 25 MG tablet Take 0.5 tablets (12.5 mg total) by mouth daily. 45 tablet 0  . triamcinolone (NASACORT) 55 MCG/ACT AERO nasal inhaler Place 1 spray into the nose daily as needed (for allergies or rhinitis).      No current facility-administered medications for this encounter.    No Known Allergies  Social History   Socioeconomic History  . Marital status:  Married    Spouse name: Not on file  . Number of children: Not on file  . Years of education: Not on file  . Highest education level: Not on file  Occupational History  . Not on file  Tobacco Use  . Smoking status: Never Smoker  . Smokeless tobacco: Never Used  Vaping Use  . Vaping Use: Never used  Substance and Sexual Activity  . Alcohol use: No  . Drug use: No  . Sexual activity: Not on file  Other Topics Concern  . Not on file  Social History Narrative  . Not on file   Social Determinants of Health   Financial Resource Strain: Not on file  Food Insecurity: Not on file  Transportation Needs: Not on file  Physical Activity: Not on file  Stress: Not on file  Social Connections: Not on file  Intimate Partner Violence: Not on file    Family History  Problem Relation Age of Onset  . Alzheimer's disease Mother   . Asthma Father     ROS- All systems are reviewed and negative except as per the HPI above  Physical Exam: There were no vitals filed for this visit. Wt Readings from Last 3 Encounters:  10/03/20 83.5 kg  10/02/20 83.5 kg  09/10/20 83.3 kg    Labs: Lab Results  Component Value Date   NA 140 09/10/2020   K 4.0 09/10/2020   CL 104 09/10/2020   CO2 23 09/10/2020   GLUCOSE 107 (H) 09/10/2020   BUN 15 09/10/2020   CREATININE 1.15 09/10/2020   CALCIUM 8.6 09/10/2020   PHOS 3.3 08/21/2020   MG 2.0 08/26/2020   Lab Results  Component Value Date   INR 1.11 10/23/2016   Lab Results  Component Value Date   CHOL 187 08/21/2020   HDL 37 (L) 08/21/2020   LDLCALC 137 (H) 08/21/2020   TRIG 64 08/21/2020     GEN- The patient is well appearing, alert and oriented x 3 today.   Head- normocephalic, atraumatic Eyes-  Sclera clear, conjunctiva pink Ears- hearing intact Oropharynx- clear Neck- supple, no JVP Lymph- no cervical lymphadenopathy Lungs- Clear to ausculation bilaterally, normal work of breathing Heart- Regular rate and rhythm, no murmurs,  rubs or gallops, PMI not laterally displaced GI- soft, NT, ND, + BS Extremities- no clubbing, cyanosis, or edema MS- no significant deformity or atrophy Skin- no rash or lesion Psych- euthymic mood, full affect Neuro- strength and sensation are intact  EKG-Sinus brady at 57 bpm, pr int 176 ms, qrs int 86 ms, qtc 453 ms  Echo-1. Left ventricular ejection fraction, by estimation, is 25 to 30%. The  left ventricle has severely decreased function. The left ventricle  demonstrates global hypokinesis. There is moderate left ventricular  hypertrophy of the basal-septal segment.  Left  ventricular diastolic function could not be evaluated.  2. Right ventricular systolic function is normal. The right ventricular  size is normal. Tricuspid regurgitation signal is inadequate for assessing  PA pressure.  3. Left atrial size was severely dilated.  4. Right atrial size was mildly dilated.  5. The mitral valve is normal in structure. Mild mitral valve  regurgitation. No evidence of mitral stenosis.  6. The aortic valve is tricuspid. Aortic valve regurgitation is mild.  Mild aortic valve sclerosis is present, with no evidence of aortic valve  stenosis.  7. Aortic dilatation noted. There is mild dilatation of the ascending  aorta, measuring 40 mm.  8. The inferior vena cava is normal in size with greater than 50%  respiratory variability, suggesting right atrial pressure of 3 mmHg.   TEE-IMPRESSIONS    1. Left ventricular ejection fraction, by estimation, is 20 to 25%. The  left ventricle has severely decreased function. The left ventricle  demonstrates global hypokinesis. The left ventricular internal cavity size  was mildly dilated. Left ventricular  diastolic function could not be evaluated.  2. Right ventricular systolic function is normal. The right ventricular  size is normal.  3. Left atrial size was severely dilated. No left atrial/left atrial  appendage thrombus was  detected.  4. Right atrial size was severely dilated.  5. The mitral valve is degenerative. Mild to moderate mitral valve  regurgitation. No evidence of mitral stenosis.  6. The aortic valve is tricuspid. Aortic valve regurgitation is moderate.  No aortic stenosis is present.  7. The inferior vena cava is normal in size with greater than 50%  respiratory variability, suggesting right atrial pressure of 3 mmHg.  8. Evidence of atrial level shunting detected by color flow Doppler.  There is a small patent foramen ovale with predominantly left to right  shunting across the atrial septum.   Conclusion(s)/Recommendation(s): No LA/LAA thrombus identified. Successful  cardioversion performed with restoration of normal sinus rhythm.   Assessment and Plan: 1. Persistent afib Successfully loaded on amiodarone and cardioverted to SR in early December Bradycardia on f/u and BB was decreased form 100 mg daily to 50 mg daily  He was in afib rate controlled on f/u with Dr. Radford Pax, 1/12, but had HR's in the 50's on the next day, indicating probable return of  SR  He is in [place] today He feels well, no symptoms  of afib He has severely dilated left atrium, so I feel we may have to tolerate some afib break through  He  is not an ablation candidate for same reason I doubt Tikosyn would not work any better than amiodarone at this point.  I offered an heart monitor but he declines   Continue  amiodarone 200 mg daily  Continue toprol xl 50 mg daily   2. CHA2DS2VASc score of 3 Continue eliquis 5 mg bid   F/u with Dr. Radford Pax in 2 months   Geroge Baseman. Jentry Mcqueary, [facility] [address] [place], [place] [address] [phone]

## 2020-10-10 MED FILL — ADVAIR 250/50 DISKUS: 250-50 | 30 days supply | Qty: 60 | Fill #0

## 2020-10-16 DIAGNOSIS — M6281 Muscle weakness (generalized): Secondary | ICD-10-CM | POA: Diagnosis not present

## 2020-10-16 DIAGNOSIS — S43015D Anterior dislocation of left humerus, subsequent encounter: Secondary | ICD-10-CM | POA: Diagnosis not present

## 2020-10-30 DIAGNOSIS — S43015D Anterior dislocation of left humerus, subsequent encounter: Secondary | ICD-10-CM | POA: Diagnosis not present

## 2020-10-30 DIAGNOSIS — M6281 Muscle weakness (generalized): Secondary | ICD-10-CM | POA: Diagnosis not present

## 2020-11-04 ENCOUNTER — Other Ambulatory Visit: Payer: Self-pay | Admitting: Allergy and Immunology

## 2020-11-04 ENCOUNTER — Other Ambulatory Visit (HOSPITAL_COMMUNITY): Payer: Self-pay | Admitting: Allergy and Immunology

## 2020-11-04 MED FILL — MONTELUKAST SOD 10 MG TAB: 10 | 90 days supply | Qty: 90 | Fill #0

## 2020-11-14 ENCOUNTER — Other Ambulatory Visit: Payer: Self-pay

## 2020-11-14 ENCOUNTER — Ambulatory Visit: Payer: Medicare Other | Admitting: Orthopaedic Surgery

## 2020-11-14 ENCOUNTER — Encounter: Payer: Self-pay | Admitting: Orthopaedic Surgery

## 2020-11-14 VITALS — Ht 66.0 in | Wt 183.0 lb

## 2020-11-14 DIAGNOSIS — S4292XD Fracture of left shoulder girdle, part unspecified, subsequent encounter for fracture with routine healing: Secondary | ICD-10-CM

## 2020-11-14 NOTE — Progress Notes (Signed)
Post-Op Visit Note   Patient: Tony Roberts           Date of Birth: 1940/12/05           MRN: [id number] Visit Date: 11/14/2020 PCP: Alroy Dust, L.Marlou Sa, MD   Assessment & Plan:  Chief Complaint:  Chief Complaint  Patient presents with  . Left Shoulder - Follow-up    S/p dislocation   Visit Diagnoses:  1. Traumatic closed displaced fracture of left shoulder with anterior dislocation with routine healing, subsequent encounter     Plan: Patient is a pleasant 80 year old gentleman who comes in today 12 weeks out close reduction left anterior shoulder dislocation.  He has been doing well.  He has been in physical therapy making slow but steady progress.  He still has very mild limitation in range of motion and strength.  He notes a deformity to the biceps which he thinks occurred prior to the dislocation.  This is minimally tender.  Examination of his left shoulder reveals approximately 90 degrees of forward flexion and abduction.  He does have limitation with internal and external rotation.  At this point, we would like for him to continue with his physical therapy to help with range of motion strengthening.  I believe his rotator cuff deficient prior to the dislocation but was able to compensate well for this.  He will follow up with [place] in 2 months time for recheck.  Call with concerns or questions.  Follow-Up Instructions: Return in about 2 months (around 01/12/2021).   Orders:  No orders of the defined types were placed in this encounter.  No orders of the defined types were placed in this encounter.   Imaging: No new imaging  PMFS History: Patient Active Problem List   Diagnosis Date Noted  . Dilated cardiomyopathy ([place])   . PAF (paroxysmal atrial fibrillation) ([place])   . Traumatic closed displaced fracture of left shoulder with anterior dislocation   . Radiculopathy 10/29/2016  . Left fibular fracture 10/27/2013  . Sprain and strain of unspecified site of hip and thigh  05/26/2012  . Other specified iron deficiency anemias    Past Medical History:  Diagnosis Date  . Arthritis   . Asthma    last asthma attack at age 36  . Dilated cardiomyopathy ([place])    likely tachy mediated from atrial fibrillation  . GERD (gastroesophageal reflux disease)   . Headache   . Other specified iron deficiency anemias   . PAC (premature atrial contraction)    occasional PAC's  . PAF (paroxysmal atrial fibrillation) ([facility])    s/p TEE/DCCV 08/2020    Family History  Problem Relation Age of Onset  . Alzheimer's disease Mother   . Asthma Father     Past Surgical History:  Procedure Laterality Date  . CARDIOVERSION N/A 08/27/2020   Procedure: CARDIOVERSION;  Surgeon: Dorothy Spark, MD;  Location: [facility] ENDOSCOPY;  Service: Cardiovascular;  Laterality: N/A;  . CATARACT EXTRACTION    . HERNIA REPAIR    . SHOULDER CLOSED REDUCTION Left 08/23/2020   Procedure: CLOSED REDUCTION SHOULDER;  Surgeon: [name], MD;  Location: [place];  Service: Orthopedics;  Laterality: Left;  . SPINE SURGERY  10/29/2016  . TEE WITHOUT CARDIOVERSION N/A 08/27/2020   Procedure: TRANSESOPHAGEAL ECHOCARDIOGRAM (TEE);  Surgeon: Dorothy Spark, MD;  Location: [facility] ENDOSCOPY;  Service: Cardiovascular;  Laterality: N/A;  . TONSILLECTOMY     Social History   Occupational History  . Not on file  Tobacco Use  .  Smoking status: Never Smoker  . Smokeless tobacco: Never Used  Vaping Use  . Vaping Use: Never used  Substance and Sexual Activity  . Alcohol use: No  . Drug use: No  . Sexual activity: Not on file

## 2020-11-18 DIAGNOSIS — M6281 Muscle weakness (generalized): Secondary | ICD-10-CM | POA: Diagnosis not present

## 2020-11-18 DIAGNOSIS — S43015D Anterior dislocation of left humerus, subsequent encounter: Secondary | ICD-10-CM | POA: Diagnosis not present

## 2020-11-19 DIAGNOSIS — D1801 Hemangioma of skin and subcutaneous tissue: Secondary | ICD-10-CM | POA: Diagnosis not present

## 2020-11-19 DIAGNOSIS — D485 Neoplasm of uncertain behavior of skin: Secondary | ICD-10-CM | POA: Diagnosis not present

## 2020-11-19 DIAGNOSIS — D225 Melanocytic nevi of trunk: Secondary | ICD-10-CM | POA: Diagnosis not present

## 2020-11-19 DIAGNOSIS — L570 Actinic keratosis: Secondary | ICD-10-CM | POA: Diagnosis not present

## 2020-11-21 ENCOUNTER — Other Ambulatory Visit (HOSPITAL_COMMUNITY): Payer: Self-pay | Admitting: Cardiology

## 2020-11-21 ENCOUNTER — Other Ambulatory Visit: Payer: Self-pay

## 2020-11-21 MED ORDER — AMIODARONE HCL 200 MG PO TABS
200.0000 mg | ORAL_TABLET | Freq: Every day | ORAL | 3 refills | Status: DC
Start: 2020-11-21 — End: 2021-03-19

## 2020-11-21 MED ORDER — SPIRONOLACTONE 25 MG PO TABS
12.5000 mg | ORAL_TABLET | Freq: Every day | ORAL | 3 refills | Status: DC
Start: 2020-11-21 — End: 2021-07-03

## 2020-11-21 MED ORDER — APIXABAN 5 MG PO TABS: 5.0000 mg | ORAL_TABLET | Freq: Two times a day (BID) | ORAL | 1 refills | Status: DC

## 2020-11-21 MED ORDER — LOSARTAN POTASSIUM 25 MG PO TABS
25.0000 mg | ORAL_TABLET | Freq: Every day | ORAL | 3 refills | Status: DC
Start: 2020-11-21 — End: 2021-03-19

## 2020-11-21 MED FILL — LOSARTAN POTASSIUM 25 MG TA: 25 | 30 days supply | Qty: 30 | Fill #0

## 2020-11-21 MED FILL — ELIQUIS 5 MG TABLET: 5 | 90 days supply | Qty: 180 | Fill #0

## 2020-11-21 MED FILL — SPIRONOLACTONE 25 MG TABS: 25 | 90 days supply | Qty: 45 | Fill #0

## 2020-11-21 MED FILL — AMIODARONE HCL 200 MG TAB: 200 | 90 days supply | Qty: 90 | Fill #0

## 2020-11-21 NOTE — Telephone Encounter (Signed)
Pt last saw Doristine Devoid, NP on 10/08/20, last labs 09/10/20 Creat 1.15, age 80, weight 83kg, based on specified criteria pt is on appropriate dosage of Eliquis 5mg  BID for afib.  Will refill rx.

## 2020-11-25 ENCOUNTER — Other Ambulatory Visit (HOSPITAL_COMMUNITY): Payer: Self-pay | Admitting: Dermatology

## 2020-11-25 MED FILL — FLUOROURACIL 5 % CREA: 5 | 14 days supply | Qty: 40 | Fill #0

## 2020-12-10 MED FILL — ADVAIR 250/50 DISKUS: 250-50 | 30 days supply | Qty: 60 | Fill #1

## 2020-12-16 DIAGNOSIS — H6123 Impacted cerumen, bilateral: Secondary | ICD-10-CM | POA: Diagnosis not present

## 2020-12-17 DIAGNOSIS — S43015D Anterior dislocation of left humerus, subsequent encounter: Secondary | ICD-10-CM | POA: Diagnosis not present

## 2020-12-17 DIAGNOSIS — M6281 Muscle weakness (generalized): Secondary | ICD-10-CM | POA: Diagnosis not present

## 2020-12-23 ENCOUNTER — Other Ambulatory Visit (HOSPITAL_COMMUNITY): Payer: Self-pay

## 2020-12-23 MED FILL — Losartan Potassium Tab 25 MG: ORAL | 90 days supply | Qty: 90 | Fill #0 | Status: AC

## 2020-12-23 MED FILL — Famotidine Tab 40 MG: ORAL | 90 days supply | Qty: 90 | Fill #0 | Status: AC

## 2020-12-24 ENCOUNTER — Ambulatory Visit: Payer: Medicare Other | Admitting: Cardiology

## 2020-12-24 ENCOUNTER — Encounter: Payer: Self-pay | Admitting: Cardiology

## 2020-12-24 ENCOUNTER — Other Ambulatory Visit (HOSPITAL_COMMUNITY): Payer: Self-pay

## 2020-12-24 ENCOUNTER — Other Ambulatory Visit: Payer: Self-pay

## 2020-12-24 VITALS — BP 138/78 | HR 53 | Ht 66.0 in | Wt 184.0 lb

## 2020-12-24 DIAGNOSIS — I5022 Chronic systolic (congestive) heart failure: Secondary | ICD-10-CM

## 2020-12-24 DIAGNOSIS — I4729 Other ventricular tachycardia: Secondary | ICD-10-CM

## 2020-12-24 DIAGNOSIS — I472 Ventricular tachycardia: Secondary | ICD-10-CM | POA: Diagnosis not present

## 2020-12-24 DIAGNOSIS — I480 Paroxysmal atrial fibrillation: Secondary | ICD-10-CM

## 2020-12-24 DIAGNOSIS — R072 Precordial pain: Secondary | ICD-10-CM

## 2020-12-24 DIAGNOSIS — H35033 Hypertensive retinopathy, bilateral: Secondary | ICD-10-CM | POA: Diagnosis not present

## 2020-12-24 DIAGNOSIS — G459 Transient cerebral ischemic attack, unspecified: Secondary | ICD-10-CM | POA: Diagnosis not present

## 2020-12-24 DIAGNOSIS — H5213 Myopia, bilateral: Secondary | ICD-10-CM | POA: Diagnosis not present

## 2020-12-24 DIAGNOSIS — R079 Chest pain, unspecified: Secondary | ICD-10-CM

## 2020-12-24 DIAGNOSIS — H35443 Age-related reticular degeneration of retina, bilateral: Secondary | ICD-10-CM | POA: Diagnosis not present

## 2020-12-24 DIAGNOSIS — Z961 Presence of intraocular lens: Secondary | ICD-10-CM | POA: Diagnosis not present

## 2020-12-24 MED ORDER — METOPROLOL SUCCINATE ER 25 MG PO TB24
25.0000 mg | ORAL_TABLET | Freq: Every day | ORAL | 3 refills | Status: DC
  Filled 2020-12-24: qty 90, 90d supply, fill #0

## 2020-12-24 NOTE — Addendum Note (Signed)
Addended by: Antonieta Iba on: 12/24/2020 10:22 AM   Modules accepted: Orders

## 2020-12-24 NOTE — Progress Notes (Signed)
Cardiology Office Note:    Date:  12/24/2020   ID:  Tony Roberts, DOB 03/16/1941, MRN [id number]  PCP:  Aurea Graff.Marlou Sa, MD  Cardiologist:  No primary care provider on file.    Referring MD: Alroy Dust, L.Marlou Sa, MD   Chief Complaint  Patient presents with  . Atrial Fibrillation  . Cardiomyopathy  . Congestive Heart Failure  . Hypertension    History of Present Illness:    Tony Roberts is a 80 y.o. male with a hx of HTN, GERD, asthma, & diverticulosis. He was hospitalized in early Dec 2022 with 4-5 day hx of palpitations, DOE, cough and was found to be in afib with RVR by his allergist and sent to the ER.  He also had had 3 TIAs in the month prior, each lasting 30 min with one-sided numbness and facial droop with speech problems and spontaneously resolved.  MRI done 11/15 showed no acute CVA but small chronic infarct in the left cerebellum.  He underwent TEE/DCCV to NSR and transitioned to PO Amio 200mg  daily.  He was continued on Toprol and started on Eliquis 5mg  BID.  2D echo 08/22/2020 showed severe LV dysfunction with EF 25-30% felt to possibly be due to tachy mediated DCM with  LVF with severe LAE and mild AR by TTE.  TEE showed a smsall PFO with L>R shunting and moderate AR.   He was seen back by Truitt Merle, NP and was doing well.  Maintaining sinus but HR slow in the 40's and Toprol was cut back to 50mg  daily.  At last OV with me in Jan 2022 and was found to be back in afib and was referred to afib clinic.  Apparently the day after I saw him he was noted to have a drop in his HR from 90's to 50's and was in SR in afib clinic.  He is not an afib candidate due to severe LAE and it was felt that Amio or Tikosyn would help maintain NSR due to how enlarged the lA was.  It was decided to keep him on Amio 200mg  daily.  He is here today for followup and is doing well.  He says that sporadically he will feel a dull ache in his chest that is nonexertional and lasts about 30 minutes.  There are  no associated sx of nausea or diaphoresis. He denies any SOB, DOE, PND, orthopnea, LE edema, dizziness, palpitations or syncope. His HR recently has been in the low 50's and upper 40's.  He is feeling fatigued all the time recently and he thinks it is the low HR.  He is compliant with his meds and is tolerating meds with no SE.    Past Medical History:  Diagnosis Date  . Arthritis   . Asthma    last asthma attack at age 81  . Dilated cardiomyopathy ([place])    likely tachy mediated from atrial fibrillation  . GERD (gastroesophageal reflux disease)   . Headache   . Other specified iron deficiency anemias   . PAC (premature atrial contraction)    occasional PAC's  . PAF (paroxysmal atrial fibrillation) ([place])    s/p TEE/DCCV 08/2020  . TIA (transient ischemic attack)    3 TIAs in last 1 month.  Each episode last about 30 minutes, symptoms was one-sided numbness and facial droop and speech problems. MRI 08/2020 showed     Past Surgical History:  Procedure Laterality Date  . CARDIOVERSION N/A 08/27/2020   Procedure: CARDIOVERSION;  Surgeon: Meda Coffee,  Jamse Belfast, MD;  Location: [facility] ENDOSCOPY;  Service: Cardiovascular;  Laterality: N/A;  . CATARACT EXTRACTION    . HERNIA REPAIR    . SHOULDER CLOSED REDUCTION Left 08/23/2020   Procedure: CLOSED REDUCTION SHOULDER;  Surgeon: [name], MD;  Location: [place];  Service: Orthopedics;  Laterality: Left;  . SPINE SURGERY  10/29/2016  . TEE WITHOUT CARDIOVERSION N/A 08/27/2020   Procedure: TRANSESOPHAGEAL ECHOCARDIOGRAM (TEE);  Surgeon: Dorothy Spark, MD;  Location: [facility] ENDOSCOPY;  Service: Cardiovascular;  Laterality: N/A;  . TONSILLECTOMY      Current Medications: Current Meds  Medication Sig  . ADVAIR DISKUS 250-50 MCG/DOSE AEPB INHALE 1 PUFF BY MOUTH ONCE TO TWICE A DAY TO PREVENT COUGH OR WHEEZE. RINSE, GARGLE, AND SPIT AFTER USE.  Marland Kitchen albuterol (PROVENTIL HFA) 108 (90 Base) MCG/ACT inhaler Inhale two puffs every four to six hours as needed  for cough or wheeze.  Marland Kitchen amiodarone (PACERONE) 200 MG tablet Take 1 tablet (200 mg total) by mouth daily.  Marland Kitchen apixaban (ELIQUIS) 5 MG TABS tablet Take 1 tablet (5 mg total) by mouth 2 (two) times daily.  Marland Kitchen esomeprazole (NEXIUM) 40 MG capsule Take 40 mg by mouth See admin instructions. Take 40 mg by mouth in the morning before breakfast and an additional 40 mg once a day as needed for recurring heartburn  . famotidine (PEPCID) 40 MG tablet Take 40 mg by mouth at bedtime.   Marland Kitchen losartan (COZAAR) 25 MG tablet Take 1 tablet (25 mg total) by mouth daily.  . montelukast (SINGULAIR) 10 MG tablet TAKE 1 TABLET (10 MG TOTAL) BY MOUTH AT BEDTIME.  Marland Kitchen spironolactone (ALDACTONE) 25 MG tablet Take 0.5 tablets (12.5 mg total) by mouth daily.  Marland Kitchen triamcinolone (NASACORT) 55 MCG/ACT AERO nasal inhaler Place 1 spray into the nose daily as needed (for allergies or rhinitis).      Allergies:   Patient has no known allergies.   Social History   Socioeconomic History  . Marital status: Married    Spouse name: Not on file  . Number of children: Not on file  . Years of education: Not on file  . Highest education level: Not on file  Occupational History  . Not on file  Tobacco Use  . Smoking status: Never Smoker  . Smokeless tobacco: Never Used  Vaping Use  . Vaping Use: Never used  Substance and Sexual Activity  . Alcohol use: No  . Drug use: No  . Sexual activity: Not on file  Other Topics Concern  . Not on file  Social History Narrative  . Not on file   Social Determinants of Health   Financial Resource Strain: Not on file  Food Insecurity: Not on file  Transportation Needs: Not on file  Physical Activity: Not on file  Stress: Not on file  Social Connections: Not on file     Family History: The patient's family history includes Alzheimer's disease in his mother; Asthma in his father.  ROS:   Please see the history of present illness.    ROS  All other systems reviewed and negative.    EKGs/Labs/Other Studies Reviewed:    The following studies were reviewed today:  TEE 08/27/2020 IMPRESSIONS   1. Left ventricular ejection fraction, by estimation, is 20 to 25%. The  left ventricle has severely decreased function. The left ventricle  demonstrates global hypokinesis. The left ventricular internal cavity size  was mildly dilated. Left ventricular  diastolic function could not be evaluated.  2.  Right ventricular systolic function is normal. The right ventricular  size is normal.  3. Left atrial size was severely dilated. No left atrial/left atrial  appendage thrombus was detected.  4. Right atrial size was severely dilated.  5. The mitral valve is degenerative. Mild to moderate mitral valve  regurgitation. No evidence of mitral stenosis.  6. The aortic valve is tricuspid. Aortic valve regurgitation is moderate.  No aortic stenosis is present.  7. The inferior vena cava is normal in size with greater than 50%  respiratory variability, suggesting right atrial pressure of 3 mmHg.  8. Evidence of atrial level shunting detected by color flow Doppler.  There is a small patent foramen ovale with predominantly left to right  shunting across the atrial septum.   2D echo 08/22/2020 IMPRESSIONS   1. Left ventricular ejection fraction, by estimation, is 25 to 30%. The  left ventricle has severely decreased function. The left ventricle  demonstrates global hypokinesis. There is moderate left ventricular  hypertrophy of the basal-septal segment. Left  ventricular diastolic function could not be evaluated.  2. Right ventricular systolic function is normal. The right ventricular  size is normal. Tricuspid regurgitation signal is inadequate for assessing  PA pressure.  3. Left atrial size was severely dilated.  4. Right atrial size was mildly dilated.  5. The mitral valve is normal in structure. Mild mitral valve  regurgitation. No evidence of mitral stenosis.   6. The aortic valve is tricuspid. Aortic valve regurgitation is mild.  Mild aortic valve sclerosis is present, with no evidence of aortic valve  stenosis.  7. Aortic dilatation noted. There is mild dilatation of the ascending  aorta, measuring 40 mm.  8. The inferior vena cava is normal in size with greater than 50%  respiratory variability, suggesting right atrial pressure of 3 mmHg.   EKG:  EKG is  ordered today.  The ekg ordered today demonstrates atrial fibrillation at 90bpm with nonspecific T wave abnormality  Recent Labs: 08/21/2020: ALT 20; B Natriuretic Peptide 328.4; TSH 4.301 08/26/2020: Magnesium 2.0 09/10/2020: BUN 15; Creatinine, Ser 1.15; Hemoglobin 12.7; Platelets 191; Potassium 4.0; Sodium 140   Recent Lipid Panel    Component Value Date/Time   CHOL 187 08/21/2020 1629   TRIG 64 08/21/2020 1629   HDL 37 (L) 08/21/2020 1629   CHOLHDL 5.1 08/21/2020 1629   VLDL 13 08/21/2020 1629   LDLCALC 137 (H) 08/21/2020 1629    CHA2DS2-VASc Score = 5 [CHF History: No, HTN History: Yes, Diabetes History: No, Stroke History: Yes, Vascular Disease History: No, Age Score: 2, Gender Score: 0].  Therefore, the patient's annual risk of stroke is 7.2 %.        Physical Exam:    VS:  BP 138/78   Pulse (!) 53   Ht 5\' 6"  (1.676 m)   Wt 184 lb (83.5 kg)   SpO2 97%   BMI 29.70 kg/m     Wt Readings from Last 3 Encounters:  12/24/20 184 lb (83.5 kg)  11/14/20 183 lb (83 kg)  10/08/20 183 lb (83 kg)     GEN: Well nourished, well developed in no acute distress HEENT: Normal NECK: No JVD; No carotid bruits LYMPHATICS: No lymphadenopathy CARDIAC:RRR, no murmurs, rubs, gallops RESPIRATORY:  Clear to auscultation without rales, wheezing or rhonchi  ABDOMEN: Soft, non-tender, non-distended MUSCULOSKELETAL:  No edema; No deformity  SKIN: Warm and dry NEUROLOGIC:  Alert and oriented x 3 PSYCHIATRIC:  Normal affect    ASSESSMENT:  1. PAF (paroxysmal atrial fibrillation)  ([place])   2. Chronic systolic heart failure ([place])   3. TIA (transient ischemic attack)   4. NSVT (nonsustained ventricular tachycardia) ([facility])   5. Chest pain of uncertain etiology    PLAN:    In order of problems listed above:  1.  Persistent atrial fibrillation -S/P TEE/DCCV and on Amio -Toprol cut back to 50mg  daily due to significant bradycardia -he reverted back to afib with CVR and was referred to afib clinic and was back in SR at that [facility] -it was felt that due to severe LAE he was not a candidate for afib ablation, Tikosyn or Amio -he is asymptomatic when in afib and it was recommended to continue Amio since he is rate controlled when he goes into afib -he denies any bleeding problems on DOAC -continue Amio 200mg  daily and Eliquis 5mg  BID -his HR has been in the upper 40's to low 50's at home and he has been very fatigued -decrease Toprol XL to 25mg  daily -SCr 1.5 and Hbg 12.7 in Dec 2021 -TSH and ALT normal 08/2020 -check PFTs with DLCO  2.  Chronic systolic CHF -EF 51-76% on echo and presumed tachy mediated -plan was treatment of CHF with GDMT after restoring NSR from TEE/DCCV and then repeat 2D echo in 2 weeks -he appears euvolemic on exam today -continue Toprol XL, spiro 12.5mg  daily and Losartan 25mg  daily >>he declined Enetresto -repeat 2D echo  to see if EF has improved.  -SCR stable at 1.15 and K+ 4 in Dec 2021  3.  Hx of TIA -likely related to #1 -continue Eliquis  4.  NSVT -controlled on BB and Amio  5.  Chest pain -somewhat atypical in that it is nonexertional and exertional and is not associated with any other symptoms -I will get a Lexiscan myoview to rule out ischemia -Shared Decision Making/Informed Consent The risks [stroke (1 in 1000), death (1 in 1000), kidney failure [usually temporary] (1 in 500), bleeding (1 in 200), allergic reaction [possibly serious] (1 in 200)], benefits (diagnostic support and management of coronary artery disease) and  alternatives of a cardiac catheterization were discussed in detail with Mr. Otting and he is willing to proceed.   Medication Adjustments/Labs and Tests Ordered: Current medicines are reviewed at length with the patient today.  Concerns regarding medicines are outlined above.  No orders of the defined types were placed in this encounter.  No orders of the defined types were placed in this encounter.   Signed, Fransico Him, MD  12/24/2020 10:13 AM    [place]

## 2020-12-24 NOTE — Addendum Note (Signed)
Addended by: Fransico Him R on: 12/24/2020 10:50 AM   Modules accepted: Orders

## 2020-12-24 NOTE — Addendum Note (Signed)
Addended by: Antonieta Iba on: 12/24/2020 10:47 AM   Modules accepted: Orders

## 2020-12-24 NOTE — Patient Instructions (Signed)
Medication Instructions:  Your physician has recommended you make the following change in your medication:  1) DECREASE Toprol XL to 25 mg daily   *If you need a refill on your cardiac medications before your next appointment, please call your pharmacy*  Testing/Procedures: Your physician has requested that you have a lexiscan myoview. For further information please visit [email or web address]. Please follow instruction sheet, as given.  Your physician has recommended that you have a pulmonary function test. Pulmonary Function Tests are a group of tests that measure how well air moves in and out of your lungs.  Your physician has requested that you have an echocardiogram. Echocardiography is a painless test that uses sound waves to create images of your heart. It provides your doctor with information about the size and shape of your heart and how well your heart's chambers and valves are working. This procedure takes approximately one hour. There are no restrictions for this procedure.  Follow-Up: At [facility], you and your health needs are our priority.  As part of our continuing mission to provide you with exceptional heart care, we have created designated Provider Care Teams.  These Care Teams include your primary Cardiologist (physician) and Advanced Practice Providers (APPs -  Physician Assistants and Nurse Practitioners) who all work together to provide you with the care you need, when you need it.  Your next appointment:   6 month(s)  The format for your next appointment:   In Person  Provider:   You may see Fransico Him, MD or one of the following Advanced Practice Providers on your designated Care Team:    Melina Copa, PA-C  Ermalinda Barrios, PA-C

## 2020-12-25 DIAGNOSIS — M75122 Complete rotator cuff tear or rupture of left shoulder, not specified as traumatic: Secondary | ICD-10-CM | POA: Diagnosis not present

## 2020-12-26 ENCOUNTER — Other Ambulatory Visit: Payer: Self-pay

## 2020-12-26 ENCOUNTER — Ambulatory Visit: Payer: Medicare Other | Admitting: Family Medicine

## 2020-12-26 ENCOUNTER — Encounter: Payer: Self-pay | Admitting: Family Medicine

## 2020-12-26 VITALS — BP 132/62 | HR 47 | Temp 97.8°F | Resp 16

## 2020-12-26 DIAGNOSIS — J3089 Other allergic rhinitis: Secondary | ICD-10-CM | POA: Insufficient documentation

## 2020-12-26 DIAGNOSIS — J45909 Unspecified asthma, uncomplicated: Secondary | ICD-10-CM | POA: Insufficient documentation

## 2020-12-26 DIAGNOSIS — J4540 Moderate persistent asthma, uncomplicated: Secondary | ICD-10-CM | POA: Diagnosis not present

## 2020-12-26 DIAGNOSIS — K219 Gastro-esophageal reflux disease without esophagitis: Secondary | ICD-10-CM | POA: Diagnosis not present

## 2020-12-26 NOTE — Progress Notes (Signed)
[place]  [address] Dept: [phone]  FOLLOW UP NOTE  Patient ID: Tony Roberts, male    DOB: 07-11-1941  Age: 80 y.o. MRN: [id number] Date of Office Visit: 12/26/2020  Assessment  Chief Complaint: Asthma (Doing well)  HPI Tony Roberts is a 80 year old male who presents to the clinic for follow-up visit.  He was last seen in this clinic on 08/19/2020 by Dr. Neldon Mc for evaluation of asthma, allergic rhinitis, reflux, and new onset atrial fibrillation.  He currently follows Dr. Golden Hurter at [facility] heart care [facility] office for paroxysmal atrial fibrillation.  At today's visit, he reports his asthma has been well controlled with no shortness of breath, cough, or wheeze.  He continues Advair 250-1 puff once a day, montelukast 10 mg once a day, and rarely uses albuterol.  Allergic rhinitis is reported as well controlled with Nasacort as needed.  He is not currently using a nasal saline rinse or antihistamine.  Reflux is reported as well controlled with no symptoms including heartburn or vomiting.  His current medications are listed in the chart.   Drug Allergies:  Allergies  Allergen Reactions  . Lisinopril     Other reaction(s): headache    Physical Exam: BP 132/62   Pulse (!) 47   Temp 97.8 F (36.6 C) (Temporal)   Resp 16   SpO2 96%    Physical Exam Vitals reviewed.  Constitutional:      Appearance: Normal appearance.  HENT:     Head: Normocephalic and atraumatic.     Right Ear: Tympanic membrane normal.     Left Ear: Tympanic membrane normal.     Nose:     Comments: Bilateral nares normal.  Pharynx normal.  Ears normal.  Eyes normal.    Mouth/Throat:     Pharynx: Oropharynx is clear.  Eyes:     Conjunctiva/sclera: Conjunctivae normal.  Cardiovascular:     Rate and Rhythm: Regular rhythm. Bradycardia present.     Heart sounds: Normal heart sounds. No murmur heard.     Comments: Patient taking metoprolol.  He reports his heart rate is  generally around 50 Pulmonary:     Effort: Pulmonary effort is normal.     Breath sounds: Normal breath sounds.     Comments: Lungs clear to auscultation Musculoskeletal:        General: Normal range of motion.  Skin:    General: Skin is warm and dry.  Neurological:     Mental Status: He is alert and oriented to person, place, and time.  Psychiatric:        Mood and Affect: Mood normal.        Behavior: Behavior normal.        Thought Content: Thought content normal.        Judgment: Judgment normal.     Diagnostics: FVC 3.65, FEV1 2.73.  Predicted FVC 3.25, predicted FEV1 2.29.  Spirometry indicates normal ventilatory function.  Assessment and Plan: 1. Asthma, moderate persistent, well-controlled   2. Other allergic rhinitis   3. LPRD (laryngopharyngeal reflux disease)     Patient Instructions  Asthma Continue Advair 250-1 puff twice a day to prevent cough or wheeze Continue montelukast 10 mg once a day to prevent cough or wheeze Continue albuterol 2 puffs once every 4 hours as needed for cough or wheeze You may use albuterol 5 to 15 minutes before activity to prevent cough or wheeze  Allergic rhinitis Continue appropriate allergen avoidance measures Continue  Claritin 10 mg once a day as needed for runny nose or itch Continue Nasacort 2 sprays in each nostril once a day as needed for a stuffy nose Consider saline nasal rinses as needed for nasal symptoms. Use this before any medicated nasal sprays for best result  Reflux Continue dietary and lifestyle modifications as listed below Continue omeprazole 40 mg twice a day and famotidine 40 mg once a day  Call the clinic if this treatment plan is not working well for you  Follow up in 6 months or sooner if needed   Return in about 6 months (around 06/27/2021), or if symptoms worsen or fail to improve.    Thank you for the opportunity to care for this patient.  Please do not hesitate to contact me with  questions.  Gareth Morgan, FNP [facility]

## 2020-12-26 NOTE — Patient Instructions (Addendum)
Asthma Continue Advair 250-1 puff twice a day to prevent cough or wheeze Continue montelukast 10 mg once a day to prevent cough or wheeze Continue albuterol 2 puffs once every 4 hours as needed for cough or wheeze You may use albuterol 5 to 15 minutes before activity to prevent cough or wheeze  Allergic rhinitis Continue appropriate allergen avoidance measures Continue Nasacort 2 sprays in each nostril once a day as needed for a stuffy nose Consider saline nasal rinses as needed for nasal symptoms. Use this before any medicated nasal sprays for best result  Reflux Continue dietary and lifestyle modifications as listed below Continue omeprazole 40 mg twice a day and famotidine 40 mg once a day  Call the clinic if this treatment plan is not working well for you  Follow up in 6 months or sooner if needed.   Lifestyle Changes for Controlling GERD When you have GERD, stomach acid feels as if it's backing up toward your mouth. Whether or not you take medication to control your GERD, your symptoms can often be improved with lifestyle changes.   Raise Your Head  Reflux is more likely to strike when you're lying down flat, because stomach fluid can  flow backward more easily. Raising the head of your bed 4-6 inches can help. To do this:  Slide blocks or books under the legs at the head of your bed. Or, place a wedge under  the mattress. Many foam stores can make a suitable wedge for you. The wedge  should run from your waist to the top of your head.  Don't just prop your head on several pillows. This increases pressure on your  stomach. It can make GERD worse.  Watch Your Eating Habits Certain foods may increase the acid in your stomach or relax the lower esophageal sphincter, making GERD more likely. It's best to avoid the following:  Coffee, tea, and carbonated drinks (with and without caffeine)  Fatty, fried, or spicy food  Mint, chocolate, onions, and tomatoes  Any other  foods that seem to irritate your stomach or cause you pain  Relieve the Pressure  Eat smaller meals, even if you have to eat more often.  Don't lie down right after you eat. Wait a few hours for your stomach to empty.  Avoid tight belts and tight-fitting clothes.  Lose excess weight.  Tobacco and Alcohol  Avoid smoking tobacco and drinking alcohol. They can make GERD symptoms worse.

## 2020-12-27 NOTE — Addendum Note (Signed)
Addended by: Larence Penning on: 12/27/2020 01:53 PM   Modules accepted: Orders

## 2020-12-31 ENCOUNTER — Other Ambulatory Visit (HOSPITAL_COMMUNITY): Payer: Self-pay

## 2020-12-31 MED FILL — Esomeprazole Magnesium Cap Delayed Release 40 MG (Base Eq): ORAL | 30 days supply | Qty: 60 | Fill #0 | Status: CN

## 2020-12-31 MED FILL — Esomeprazole Magnesium Cap Delayed Release 40 MG (Base Eq): ORAL | 90 days supply | Qty: 180 | Fill #0 | Status: CN

## 2021-01-01 ENCOUNTER — Telehealth: Payer: Self-pay

## 2021-01-01 NOTE — Telephone Encounter (Signed)
Detailed instructions left on the patient's answering machine. Asked to call back with any questions. S.Waleska Buttery EMTP 

## 2021-01-02 ENCOUNTER — Other Ambulatory Visit (HOSPITAL_COMMUNITY): Payer: Self-pay

## 2021-01-03 ENCOUNTER — Other Ambulatory Visit (HOSPITAL_COMMUNITY): Payer: Self-pay

## 2021-01-03 MED FILL — Esomeprazole Magnesium Cap Delayed Release 40 MG (Base Eq): ORAL | 90 days supply | Qty: 180 | Fill #0 | Status: AC

## 2021-01-07 ENCOUNTER — Other Ambulatory Visit: Payer: Self-pay

## 2021-01-07 ENCOUNTER — Telehealth: Payer: Self-pay | Admitting: Cardiology

## 2021-01-07 ENCOUNTER — Ambulatory Visit (HOSPITAL_COMMUNITY): Payer: Medicare Other | Attending: Cardiology

## 2021-01-07 DIAGNOSIS — R072 Precordial pain: Secondary | ICD-10-CM | POA: Diagnosis not present

## 2021-01-07 LAB — MYOCARDIAL PERFUSION IMAGING
LV dias vol: 151 mL (ref 62–150)
LV sys vol: 68 mL
Peak HR: 70 {beats}/min
Rest HR: 49 {beats}/min
SDS: 0
SRS: 0
SSS: 0
TID: 1.12

## 2021-01-07 MED ORDER — TECHNETIUM TC 99M TETROFOSMIN IV KIT
32.6000 | PACK | Freq: Once | INTRAVENOUS | Status: AC | PRN
Start: 2021-01-07 — End: 2021-01-07
  Administered 2021-01-07: 32.6 via INTRAVENOUS
  Filled 2021-01-07: qty 33

## 2021-01-07 MED ORDER — REGADENOSON 0.4 MG/5ML IV SOLN
0.4000 mg | Freq: Once | INTRAVENOUS | Status: AC
  Administered 2021-01-07: 0.4 mg via INTRAVENOUS

## 2021-01-07 MED ORDER — TECHNETIUM TC 99M TETROFOSMIN IV KIT
10.3000 | PACK | Freq: Once | INTRAVENOUS | Status: AC | PRN
Start: 2021-01-07 — End: 2021-01-07
  Administered 2021-01-07: 10.3 via INTRAVENOUS
  Filled 2021-01-07: qty 11

## 2021-01-07 NOTE — Telephone Encounter (Signed)
   Pt is returning call to get stress test result  

## 2021-01-07 NOTE — Telephone Encounter (Signed)
The patient has been notified of the result from his stress test and verbalized understanding.  All questions (if any) were answered. Antonieta Iba, RN 01/07/2021 4:49 PM

## 2021-01-14 ENCOUNTER — Encounter: Payer: Self-pay | Admitting: Orthopaedic Surgery

## 2021-01-14 ENCOUNTER — Other Ambulatory Visit: Payer: Self-pay

## 2021-01-14 ENCOUNTER — Ambulatory Visit (INDEPENDENT_AMBULATORY_CARE_PROVIDER_SITE_OTHER): Payer: Medicare Other | Admitting: Orthopaedic Surgery

## 2021-01-14 VITALS — Ht 66.0 in | Wt 184.0 lb

## 2021-01-14 DIAGNOSIS — S4292XD Fracture of left shoulder girdle, part unspecified, subsequent encounter for fracture with routine healing: Secondary | ICD-10-CM

## 2021-01-14 DIAGNOSIS — S43005D Unspecified dislocation of left shoulder joint, subsequent encounter: Secondary | ICD-10-CM

## 2021-01-14 NOTE — Progress Notes (Signed)
Office Visit Note   Patient: Tony Roberts           Date of Birth: 1941/02/06           MRN: [id number] Visit Date: 01/14/2021              Requested by: Tony Roberts, Tony Roberts, [place] [employer] [address] [place],  [place] [address] PCP: Tony Roberts, Tony Sa, MD   Assessment & Plan: Visit Diagnoses:  1. Traumatic closed displaced fracture of left shoulder with anterior dislocation with routine healing, subsequent encounter   2. Dislocation of left shoulder joint, subsequent encounter     Plan: I agree with Tony Roberts assessment to be conservative with the treatment.  Tony Roberts understands that he will have to live with the dysfunction and slight discomfort with certain activities but he is excepting of this.  From my standpoint he can perform any activities within the limits of his pain.  We will see him back as needed.  Follow-Up Instructions: Return if symptoms worsen or fail to improve.   Orders:  No orders of the defined types were placed in this encounter.  No orders of the defined types were placed in this encounter.     Procedures: No procedures performed   Clinical Data: No additional findings.   Subjective: Chief Complaint  Patient presents with  . Left Shoulder - Follow-up    Tony Roberts returns today for follow-up status post left shoulder dislocation.  He feels that he has plateaued with physical therapy.  In the meantime he saw Tony Roberts for second opinion.  Basically Tony Roberts recommended living with the pain and dysfunction because the only real option is for reverse shoulder replacement.   Review of Systems   Objective: Vital Signs: Ht 5\' 6"  (1.676 m)   Wt 184 lb (83.5 kg)   BMI 29.70 kg/m   Physical Exam  Ortho Exam Left shoulder exam is essentially unchanged.  Decreased active range of motion secondary to slight pain and mainly weakness. Specialty Comments:  No specialty comments available.  Imaging: No results found.   PMFS  History: Patient Active Problem List   Diagnosis Date Noted  . Asthma, well controlled 12/26/2020  . LPRD (laryngopharyngeal reflux disease) 12/26/2020  . Other allergic rhinitis 12/26/2020  . Dilated cardiomyopathy ([place])   . PAF (paroxysmal atrial fibrillation) ([place])   . Traumatic closed displaced fracture of left shoulder with anterior dislocation   . Radiculopathy 10/29/2016  . Left fibular fracture 10/27/2013  . Sprain and strain of unspecified site of hip and thigh 05/26/2012  . Other specified iron deficiency anemias    Past Medical History:  Diagnosis Date  . Arthritis   . Asthma    last asthma attack at age 83  . Dilated cardiomyopathy ([place])    likely tachy mediated from atrial fibrillation  . GERD (gastroesophageal reflux disease)   . Headache   . Other specified iron deficiency anemias   . PAC (premature atrial contraction)    occasional PAC's  . PAF (paroxysmal atrial fibrillation) ([place])    s/p TEE/DCCV 08/2020  . TIA (transient ischemic attack)    3 TIAs in last 1 month.  Each episode last about 30 minutes, symptoms was one-sided numbness and facial droop and speech problems. MRI 08/2020 showed A tiny chronic infarct     Family History  Problem Relation Age of Onset  . Alzheimer's disease Mother   . Asthma Father     Past Surgical History:  Procedure  Laterality Date  . CARDIOVERSION N/A 08/27/2020   Procedure: CARDIOVERSION;  Surgeon: Tony Spark, MD;  Location: [facility] ENDOSCOPY;  Service: Cardiovascular;  Laterality: N/A;  . CATARACT EXTRACTION    . HERNIA REPAIR    . SHOULDER CLOSED REDUCTION Left 08/23/2020   Procedure: CLOSED REDUCTION SHOULDER;  Surgeon: Tony Koyanagi, MD;  Location: [facility];  Service: Orthopedics;  Laterality: Left;  . SPINE SURGERY  10/29/2016  . TEE WITHOUT CARDIOVERSION N/A 08/27/2020   Procedure: TRANSESOPHAGEAL ECHOCARDIOGRAM (TEE);  Surgeon: Tony Spark, MD;  Location: [facility] ENDOSCOPY;  Service: Cardiovascular;  Laterality: N/A;   . TONSILLECTOMY     Social History   Occupational History  . Not on file  Tobacco Use  . Smoking status: Never Smoker  . Smokeless tobacco: Never Used  Vaping Use  . Vaping Use: Never used  Substance and Sexual Activity  . Alcohol use: No  . Drug use: No  . Sexual activity: Not on file

## 2021-01-16 ENCOUNTER — Other Ambulatory Visit (HOSPITAL_COMMUNITY): Payer: Self-pay

## 2021-01-30 ENCOUNTER — Other Ambulatory Visit: Payer: Self-pay

## 2021-01-30 ENCOUNTER — Ambulatory Visit (HOSPITAL_COMMUNITY): Payer: Medicare Other | Attending: Cardiology

## 2021-01-30 DIAGNOSIS — I5022 Chronic systolic (congestive) heart failure: Secondary | ICD-10-CM | POA: Insufficient documentation

## 2021-01-30 LAB — ECHOCARDIOGRAM COMPLETE
Area-P 1/2: 1.74 cm2
P 1/2 time: 950 msec
S' Lateral: 3.1 cm

## 2021-01-31 ENCOUNTER — Encounter: Payer: Self-pay | Admitting: Cardiology

## 2021-01-31 ENCOUNTER — Other Ambulatory Visit (HOSPITAL_COMMUNITY): Payer: Self-pay

## 2021-01-31 DIAGNOSIS — I77819 Aortic ectasia, unspecified site: Secondary | ICD-10-CM | POA: Insufficient documentation

## 2021-01-31 MED ORDER — FLUTICASONE-SALMETEROL 250-50 MCG/ACT IN AEPB
1.0000 | INHALATION_SPRAY | RESPIRATORY_TRACT | 0 refills | Status: DC
  Filled 2021-01-31: qty 180, 90d supply, fill #0

## 2021-01-31 MED FILL — Montelukast Sodium Tab 10 MG (Base Equiv): ORAL | 90 days supply | Qty: 90 | Fill #0 | Status: AC

## 2021-02-03 ENCOUNTER — Telehealth: Payer: Self-pay | Admitting: Cardiology

## 2021-02-03 DIAGNOSIS — I712 Thoracic aortic aneurysm, without rupture, unspecified: Secondary | ICD-10-CM

## 2021-02-03 NOTE — Telephone Encounter (Signed)
Tony Margarita, MD  01/31/2021 8:24 AM EDT      Echo showed normal heart function with thickened heart muscle, enlarged upper left chamber of the heart called the LA, mildly leaky MV and AV and mildly dilated ascending aorta. Compared to prior echo, LVF has significantly improved. The aorta has increased in diameter from 55mm to 4mm. Please get a chest MRI/RA for further evaluation   The patient has been notified of the result and verbalized understanding.  All questions (if any) were answered. Tony Iba, RN 02/03/2021 3:34 PM

## 2021-02-03 NOTE — Telephone Encounter (Signed)
Follow Up:   Pt was returning Carlyle's cal from 01-31-21, concerning his Echo results.

## 2021-02-04 ENCOUNTER — Encounter: Payer: Self-pay | Admitting: Cardiology

## 2021-02-04 ENCOUNTER — Telehealth: Payer: Self-pay | Admitting: Cardiology

## 2021-02-04 NOTE — Telephone Encounter (Signed)
Left message for patient to call and discuss preferred scheduling weekdays and times for the Cardiac MRI and MRA chest ordered by Dr. Radford Pax

## 2021-02-04 NOTE — Telephone Encounter (Signed)
Spoke with wife concerning the Wednesday 03/19/21 11:00 am Cardiac MRI/ MRA chest appointment at [facility]---arrival time is 10:30 am--1st floor admissions office.  Will mail information to patient and Mrs. Marcucci voiced her understanding.

## 2021-02-24 ENCOUNTER — Other Ambulatory Visit (HOSPITAL_COMMUNITY): Payer: Self-pay

## 2021-02-24 MED FILL — Apixaban Tab 5 MG: ORAL | 90 days supply | Qty: 180 | Fill #0 | Status: AC

## 2021-02-24 MED FILL — Amiodarone HCl Tab 200 MG: ORAL | 90 days supply | Qty: 90 | Fill #0 | Status: AC

## 2021-02-24 MED FILL — Spironolactone Tab 25 MG: ORAL | 90 days supply | Qty: 45 | Fill #0 | Status: AC

## 2021-02-25 ENCOUNTER — Other Ambulatory Visit (HOSPITAL_COMMUNITY): Payer: Self-pay

## 2021-02-28 ENCOUNTER — Other Ambulatory Visit (HOSPITAL_COMMUNITY): Payer: Self-pay

## 2021-02-28 DIAGNOSIS — M75122 Complete rotator cuff tear or rupture of left shoulder, not specified as traumatic: Secondary | ICD-10-CM | POA: Diagnosis not present

## 2021-03-03 ENCOUNTER — Other Ambulatory Visit (HOSPITAL_COMMUNITY): Payer: Self-pay

## 2021-03-13 DIAGNOSIS — I10 Essential (primary) hypertension: Secondary | ICD-10-CM | POA: Diagnosis not present

## 2021-03-13 DIAGNOSIS — K219 Gastro-esophageal reflux disease without esophagitis: Secondary | ICD-10-CM | POA: Diagnosis not present

## 2021-03-13 DIAGNOSIS — E7800 Pure hypercholesterolemia, unspecified: Secondary | ICD-10-CM | POA: Diagnosis not present

## 2021-03-13 DIAGNOSIS — J45909 Unspecified asthma, uncomplicated: Secondary | ICD-10-CM | POA: Diagnosis not present

## 2021-03-13 DIAGNOSIS — R5383 Other fatigue: Secondary | ICD-10-CM | POA: Diagnosis not present

## 2021-03-17 ENCOUNTER — Telehealth: Payer: Self-pay

## 2021-03-17 DIAGNOSIS — I480 Paroxysmal atrial fibrillation: Secondary | ICD-10-CM

## 2021-03-17 DIAGNOSIS — I5022 Chronic systolic (congestive) heart failure: Secondary | ICD-10-CM

## 2021-03-17 DIAGNOSIS — I712 Thoracic aortic aneurysm, without rupture, unspecified: Secondary | ICD-10-CM

## 2021-03-17 NOTE — Telephone Encounter (Signed)
-----   Message from Eli Hose, RN sent at 03/17/2021 11:39 AM EDT ----- Gaye Alken,  Dr. Gardiner Rhyme is wanting a hematocrit on cardiac MRI patients to help with measurements and radiology wants to know kidney function.  Can you reach out to arrange for this patient to get a CBC and BMET prior to his cardiac MRI on Wednesday (June 29)?  Thanks Kerin Ransom

## 2021-03-17 NOTE — Telephone Encounter (Signed)
Spoke with the patient and advised him to discontinue metoprolol. No current availability with PharmD next week. Advised patient to take his HR and BP daily for the next week and call us with the readings and let us know how he is feeling. Patient verbalized understanding.

## 2021-03-17 NOTE — Addendum Note (Signed)
Addended by: Antonieta Iba on: 03/17/2021 04:32 PM   Modules accepted: Orders

## 2021-03-17 NOTE — Telephone Encounter (Signed)
Lab work has been scheduled.   Patient also reports that he has also been feeling very fatigued. He states that he saw his PCP in regards to this who advised him that it might be due to some of his cardiac medications. Patient reports that he wakes up in the morning and takes his medications and is very tired for the rest of the day. He states that he doesn't have the energy to do things that he used to.  He reports that his heart rate stays between 46-52. He states that blood pressure is normally 701I/10V but systolic often fluctuates sometimes up to 140's and down to 105 at times.

## 2021-03-18 ENCOUNTER — Encounter (INDEPENDENT_AMBULATORY_CARE_PROVIDER_SITE_OTHER): Payer: Self-pay

## 2021-03-18 ENCOUNTER — Telehealth (HOSPITAL_COMMUNITY): Payer: Self-pay | Admitting: *Deleted

## 2021-03-18 ENCOUNTER — Other Ambulatory Visit: Payer: Medicare Other | Admitting: *Deleted

## 2021-03-18 ENCOUNTER — Other Ambulatory Visit: Payer: Self-pay

## 2021-03-18 DIAGNOSIS — I712 Thoracic aortic aneurysm, without rupture, unspecified: Secondary | ICD-10-CM

## 2021-03-18 DIAGNOSIS — I480 Paroxysmal atrial fibrillation: Secondary | ICD-10-CM | POA: Diagnosis not present

## 2021-03-18 DIAGNOSIS — I5022 Chronic systolic (congestive) heart failure: Secondary | ICD-10-CM

## 2021-03-18 LAB — BASIC METABOLIC PANEL
BUN/Creatinine Ratio: 8 — ABNORMAL LOW (ref 10–24)
BUN: 9 mg/dL (ref 8–27)
CO2: 22 mmol/L (ref 20–29)
Calcium: 8.7 mg/dL (ref 8.6–10.2)
Chloride: 106 mmol/L (ref 96–106)
Creatinine, Ser: 1.07 mg/dL (ref 0.76–1.27)
Glucose: 93 mg/dL (ref 65–99)
Potassium: 3.4 mmol/L — ABNORMAL LOW (ref 3.5–5.2)
Sodium: 142 mmol/L (ref 134–144)
eGFR: 71 mL/min/{1.73_m2} (ref >59)

## 2021-03-18 LAB — CBC
Hematocrit: 39.4 % (ref 37.5–51.0)
Hemoglobin: 13 g/dL (ref 13.0–17.7)
MCH: 28.6 pg (ref 26.6–33.0)
MCHC: 33 g/dL (ref 31.5–35.7)
MCV: 87 fL (ref 79–97)
Platelets: 132 10*3/uL — ABNORMAL LOW (ref 150–450)
RBC: 4.54 x10E6/uL (ref 4.14–5.80)
RDW: 13.6 % (ref 11.6–15.4)
WBC: 5.6 10*3/uL (ref 3.4–10.8)

## 2021-03-18 NOTE — Telephone Encounter (Signed)
Reaching out to patient to offer assistance regarding upcoming cardiac imaging study; pt verbalizes understanding of appt date/time, parking situation and where to check in, and verified current allergies; name and call back number provided for further questions should they arise  Dametria Tuzzolino RN Navigator Cardiac Imaging [facility] [phone] office [phone] cell  

## 2021-03-19 ENCOUNTER — Ambulatory Visit (HOSPITAL_COMMUNITY): Payer: Medicare Other

## 2021-03-19 ENCOUNTER — Other Ambulatory Visit (HOSPITAL_COMMUNITY): Payer: Self-pay

## 2021-03-19 ENCOUNTER — Ambulatory Visit (HOSPITAL_COMMUNITY)
Admission: RE | Admit: 2021-03-19 | Discharge: 2021-03-19 | Disposition: A | Discharge status: Home / Self Care | Payer: Medicare Other | Source: Ambulatory Visit | Attending: Cardiology | Admitting: Cardiology

## 2021-03-19 ENCOUNTER — Telehealth: Payer: Self-pay

## 2021-03-19 DIAGNOSIS — I712 Thoracic aortic aneurysm, without rupture, unspecified: Secondary | ICD-10-CM

## 2021-03-19 DIAGNOSIS — I5022 Chronic systolic (congestive) heart failure: Secondary | ICD-10-CM

## 2021-03-19 IMAGING — MR MR CARD MORPHOLOGY WO/W CM
45 of 48 series · 45 of 48 positions shown · IV contrast (Gadavist)
Comparison: none

CLINICAL DATA: Evaluate LVH, aortic dilatation

EXAM:
CARDIAC MRI
TECHNIQUE: The patient was scanned on a 1.5 Tesla Siemens magnet. A dedicated
cardiac coil was used. Functional imaging was done using Fiesta
sequences. [DATE], and 4 chamber views were done to assess for RWMA's.
Modified MENDEL rule using a short axis stack was used to
calculate an ejection fraction on a dedicated work station using
Circle software. The patient received 11 cc of Gadavist. After 10
minutes inversion recovery sequences were used to assess for
infiltration and scar tissue. MRA chest was also performed during
contrast administration.
CONTRAST:  11 cc  of Gadavist

[Series 4: [id number] · axial · 8.0mm · 1.56mm/px · 1 of 16 slices shown]
[im 1/16]
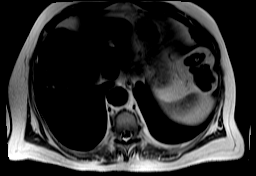

[Series 8: bSSFP · oblique · 8.0mm · 1.70mm/px · 1 of 25 slices shown (1 of 22)]
[im 1/25]
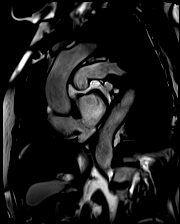

[Series 9: bSSFP · oblique · 8.0mm · 1.70mm/px · 1 of 25 slices shown (2 of 22)]
[im 1/25]
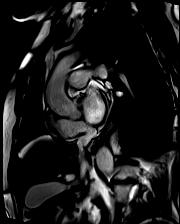

[Series 10: bSSFP · oblique · 8.0mm · 1.70mm/px · 1 of 25 slices shown (3 of 22)]
[im 1/25]
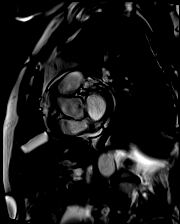

[Series 11: bSSFP · oblique · 8.0mm · 1.70mm/px · 1 of 25 slices shown (4 of 22)]
[im 1/25]
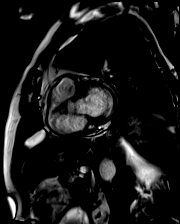

[Series 12: bSSFP · oblique · 8.0mm · 1.70mm/px · 1 of 25 slices shown (5 of 22)]
[im 1/25]
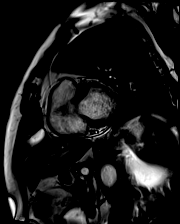

[Series 13: bSSFP · oblique · 8.0mm · 1.70mm/px · 1 of 25 slices shown (6 of 22)]
[im 1/25]
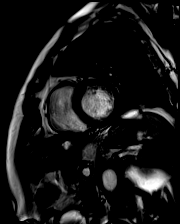

[Series 14: bSSFP · oblique · 8.0mm · 1.70mm/px · 1 of 25 slices shown (7 of 22)]
[im 1/25]
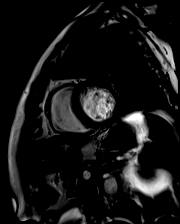

[Series 15: bSSFP · oblique · 8.0mm · 1.70mm/px · 1 of 25 slices shown (8 of 22)]
[im 1/25]
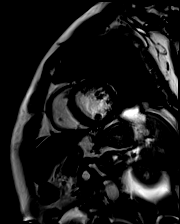

[Series 16: bSSFP · oblique · 8.0mm · 1.70mm/px · 1 of 25 slices shown (9 of 22)]
[im 1/25]
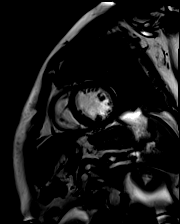

[Series 17: bSSFP · oblique · 8.0mm · 1.70mm/px · 1 of 25 slices shown (10 of 22)]
[im 1/25]
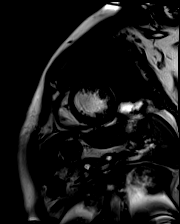

[Series 18: bSSFP · oblique · 8.0mm · 1.70mm/px · 1 of 25 slices shown (11 of 22)]
[im 1/25]
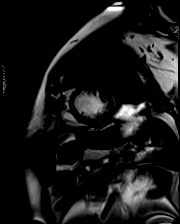

[Series 19: bSSFP · oblique · 8.0mm · 1.70mm/px · 1 of 25 slices shown (12 of 22)]
[im 1/25]
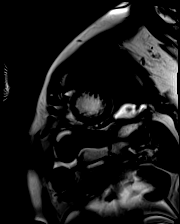

[Series 20: bSSFP · oblique · 8.0mm · 1.70mm/px · 1 of 25 slices shown (13 of 22)]
[im 1/25]
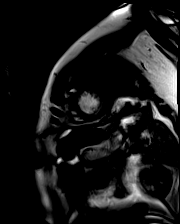

[Series 21: bSSFP · oblique · 8.0mm · 1.70mm/px · 1 of 25 slices shown (14 of 22)]
[im 1/25]
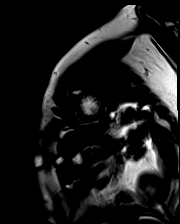

[Series 22: bSSFP · oblique · 8.0mm · 1.70mm/px · 1 of 25 slices shown (15 of 22)]
[im 1/25]
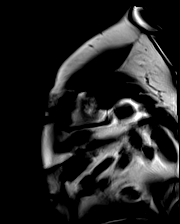

[Series 23: bSSFP · oblique · 8.0mm · 1.70mm/px · 1 of 25 slices shown (16 of 22)]
[im 1/25]
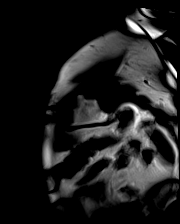

[Series 24: bSSFP · oblique · 8.0mm · 1.70mm/px · 1 of 25 slices shown (17 of 22)]
[im 1/25]
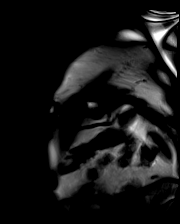

[Series 25: bSSFP · oblique · 8.0mm · 1.70mm/px · 1 of 25 slices shown (18 of 22)]
[im 1/25]
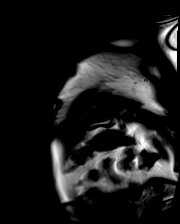

[Series 26: bSSFP · oblique · 8.0mm · 1.70mm/px · 1 of 25 slices shown (19 of 22)]
[im 1/25]
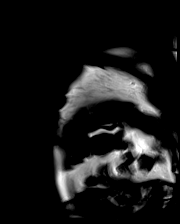

[Series 27: (id)_long_t1 · oblique · 8.0mm · 1.56mm/px · 1 of 24 slices shown]
[im 1/24]
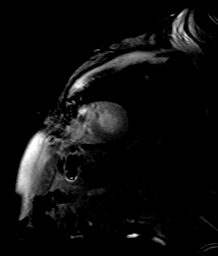

[Series 28: (id)_long_t1_moco · oblique · 8.0mm · 1.56mm/px · 1 of 24 slices shown]
[im 1/24]
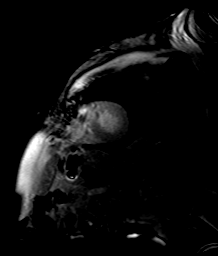

[Series 31: (id)_trufi · oblique · 8.0mm · 2.08mm/px · 1 of 9 slices shown]
[im 1/9]
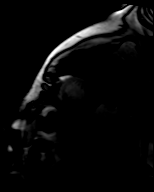

[Series 32: (id)_trufi_moco · oblique · 8.0mm · 2.08mm/px · 1 of 9 slices shown]
[im 1/9]
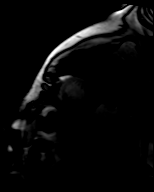

[Series 35: bSSFP · axial · 6.0mm · 1.41mm/px · 1 of 25 slices shown (20 of 22)]
[im 1/25]
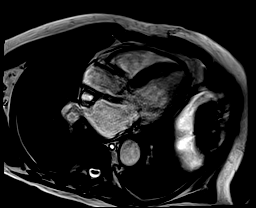

[Series 36: bSSFP · oblique · 6.0mm · 1.48mm/px · 1 of 25 slices shown (21 of 22)]
[im 1/25]
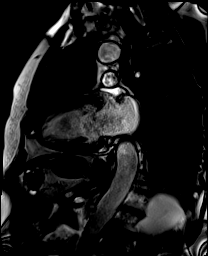

[Series 37: bSSFP · axial · 6.0mm · 1.45mm/px · 1 of 25 slices shown (22 of 22)]
[im 1/25]
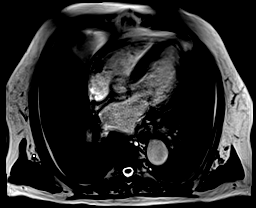

[Series 38: cine_trufi_cs_rt_short axis · oblique · 8.0mm · 1.73mm/px · 1 of 17 slices shown (1 of 15)]
[im 1/17]
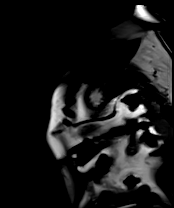

[Series 38: cine_trufi_cs_rt_short axis · oblique · 8.0mm · 1.73mm/px · 1 of 17 slices shown (2 of 15)]
[im 1/17]
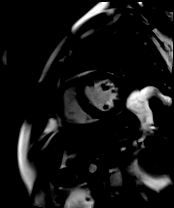

[Series 38: cine_trufi_cs_rt_short axis · oblique · 8.0mm · 1.73mm/px · 1 of 17 slices shown (3 of 15)]
[im 1/17]
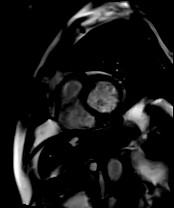

[Series 38: cine_trufi_cs_rt_short axis · oblique · 8.0mm · 1.73mm/px · 1 of 17 slices shown (4 of 15)]
[im 1/17]
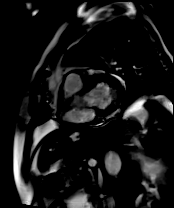

[Series 38: cine_trufi_cs_rt_short axis · oblique · 8.0mm · 1.73mm/px · 1 of 17 slices shown (5 of 15)]
[im 1/17]
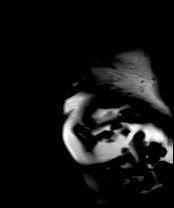

[Series 38: cine_trufi_cs_rt_short axis · oblique · 8.0mm · 1.73mm/px · 1 of 17 slices shown (6 of 15)]
[im 1/17]
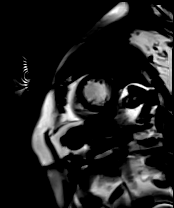

[Series 38: cine_trufi_cs_rt_short axis · oblique · 8.0mm · 1.73mm/px · 1 of 17 slices shown (7 of 15)]
[im 1/17]
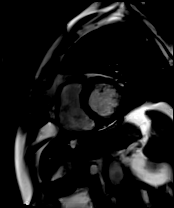

[Series 38: cine_trufi_cs_rt_short axis · oblique · 8.0mm · 1.73mm/px · 1 of 17 slices shown (8 of 15)]
[im 1/17]
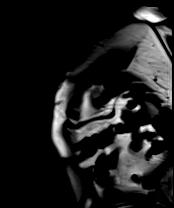

[Series 38: cine_trufi_cs_rt_short axis · oblique · 8.0mm · 1.73mm/px · 1 of 17 slices shown (9 of 15)]
[im 1/17]
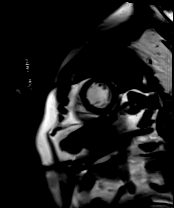

[Series 38: cine_trufi_cs_rt_short axis · oblique · 8.0mm · 1.73mm/px · 1 of 17 slices shown (10 of 15)]
[im 1/17]
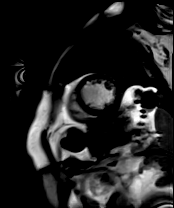

[Series 38: cine_trufi_cs_rt_short axis · oblique · 8.0mm · 1.73mm/px · 1 of 17 slices shown (11 of 15)]
[im 1/17]
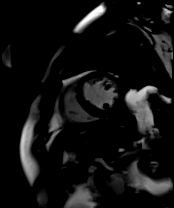

[Series 38: cine_trufi_cs_rt_short axis · oblique · 8.0mm · 1.73mm/px · 1 of 17 slices shown (12 of 15)]
[im 1/17]
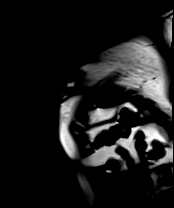

[Series 38: cine_trufi_cs_rt_short axis · oblique · 8.0mm · 1.73mm/px · 1 of 17 slices shown (13 of 15)]
[im 1/17]
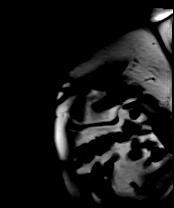

[Series 38: cine_trufi_cs_rt_short axis · oblique · 8.0mm · 1.73mm/px · 1 of 17 slices shown (14 of 15)]
[im 1/17]
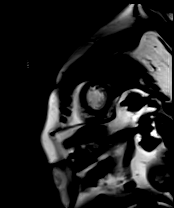

[Series 38: cine_trufi_cs_rt_short axis · oblique · 8.0mm · 1.73mm/px · 1 of 17 slices shown (15 of 15)]
[im 1/17]
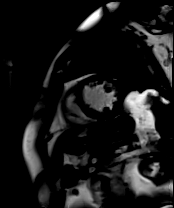

[Series 39: (person_name)_(person_name)_(person_name) · sagittal · 8.0mm · 2.08mm/px · 1 of 25 slices shown (1 of 3)]
[im 1/25]
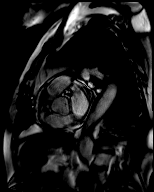

[Series 39: (person_name)_(person_name)_(person_name) · sagittal · 8.0mm · 2.08mm/px · 1 of 25 slices shown (2 of 3)]
[im 1/25]
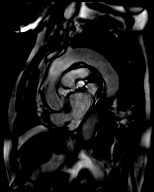

[Series 39: (person_name)_(person_name)_(person_name) · sagittal · 8.0mm · 2.08mm/px · 1 of 25 slices shown (3 of 3)]
[im 1/25]
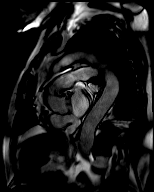

[45 of 48 positions shown; findings below may reference images not displayed]

FINDINGS: Left ventricle:

-Mild dilatation

-Normal systolic function

-Asymmetric hypertrophy measuring 13mm in basal septum (8mm in
posterior wall)

-Unable to calculate ECV as postcontrast T1 mapping was not done

-No LGE

LV EF: 60% (Normal 56-78%)

Absolute volumes:

LV EDV: 205mL (Normal 77-195 mL)

LV ESV: 82mL (Normal 19-72 mL)

LV SV: 123mL (Normal 51-133 mL)

CO: 7.4L/min (Normal 2.8-8.8 L/min)

Indexed volumes:

LV EDV: 104mL/sq-m (Normal 47-92 mL/sq-m)

LV ESV: 42mL/sq-m (Normal 13-30 mL/sq-m)

LV SV: 62mL/sq-m (Normal 32-62 mL/sq-m)

CI: 3.8L/min/sq-m (Normal 1.7-4.2 L/min/sq-m)

Right ventricle: Normal size and sytolic function

RV EF:  72% (Normal 47-74%)

Absolute volumes:

RV EDV: 166mL (Normal 88-227 mL)

RV ESV: 47mL (Normal 23-103 mL)

RV SV: 119mL (Normal 52-138 mL)

CO: 7.2L/min (Normal 2.8-8.8 L/min)

Indexed volumes:

RV EDV: 84mL/sq-m (Normal 55-105 mL/sq-m)

RV ESV: 24mL/sq-m (Normal 15-43 mL/sq-m)

RV SV: 60mL/sq-m (Normal 32-64 mL/sq-m)

CI: 3.6L/min/sq-m (Normal 1.7-4.2 L/min/sq-m)

Left atrium: Moderate enlargement

Right atrium: Mild enlargement

Mitral valve: Mild regurgitation (regurgitant fraction 11%)

Aortic valve: Mild regurgitation (regurgitant fraction 12%)

Tricuspid valve: No regurgitation

Pulmonic valve: No regurgitation

Aorta: Dilated ascending aorta measuring 42mm

Pericardium: Normal
IMPRESSION: 1.  Mild LV dilatation with normal systolic function (EF 60%)

2. Asymmetric LV hypertrophy measuring 13mm in basal septum (8mm in
posterior wall), not meet criteria for hypertrophic cardiomyopathy
(<15mm)

3.  No late gadolinium enhancement to suggest myocardial scar

4.  Normal RV size and systolic function (EF 72%)

5.  Dilated ascending aorta measuring 42mm

6.  Mild aortic regurgitation (regurgitant fraction 12%)

7.  Mild mitral regurgitation (regurgitant fraction 11%)

## 2021-03-19 MED ORDER — POTASSIUM CHLORIDE CRYS ER 20 MEQ PO TBCR
20.0000 meq | EXTENDED_RELEASE_TABLET | Freq: Every day | ORAL | 3 refills | Status: AC
  Filled 2021-03-19: qty 90, 90d supply, fill #0
  Filled 2021-06-27: qty 90, 90d supply, fill #1
  Filled 2021-10-16: qty 90, 90d supply, fill #2
  Filled 2022-01-23: qty 90, 90d supply, fill #3

## 2021-03-19 MED ORDER — GADOBUTROL 1 MMOL/ML IV SOLN
11.0000 mL | Freq: Once | INTRAVENOUS | Status: AC | PRN
  Administered 2021-03-19: 11 mL via INTRAVENOUS

## 2021-03-19 MED ORDER — LEVOTHYROXINE SODIUM 50 MCG PO TABS
50.0000 ug | ORAL_TABLET | Freq: Every morning | ORAL | 12 refills | Status: DC
  Filled 2021-03-19: qty 30, 30d supply, fill #0
  Filled 2021-04-15: qty 30, 30d supply, fill #1
  Filled 2021-05-16: qty 30, 30d supply, fill #2
  Filled 2021-06-17: qty 30, 30d supply, fill #3
  Filled 2021-07-18: qty 30, 30d supply, fill #4
  Filled 2021-09-24: qty 30, 30d supply, fill #5
  Filled 2021-10-23: qty 30, 30d supply, fill #6
  Filled 2021-11-26: qty 30, 30d supply, fill #7
  Filled 2021-12-22: qty 30, 30d supply, fill #8
  Filled 2022-01-23: qty 30, 30d supply, fill #9
  Filled 2022-02-17: qty 30, 30d supply, fill #10

## 2021-03-19 NOTE — Telephone Encounter (Signed)
-----   Message from Sueanne Margarita, MD sent at 03/19/2021  3:49 PM EDT ----- Add Kdur 15meq daily and repeat BMET in 1 week

## 2021-03-19 NOTE — Telephone Encounter (Signed)
Spoke with the patient and advised him to start taking Kdur 9meq daily. Patient is scheduled for lab work in one week. Patient verbalized understanding.

## 2021-03-24 ENCOUNTER — Encounter: Payer: Self-pay | Admitting: Cardiology

## 2021-03-24 DIAGNOSIS — I351 Nonrheumatic aortic (valve) insufficiency: Secondary | ICD-10-CM | POA: Insufficient documentation

## 2021-03-24 DIAGNOSIS — I340 Nonrheumatic mitral (valve) insufficiency: Secondary | ICD-10-CM | POA: Insufficient documentation

## 2021-03-26 ENCOUNTER — Other Ambulatory Visit (HOSPITAL_COMMUNITY): Payer: Self-pay

## 2021-03-26 MED FILL — Losartan Potassium Tab 25 MG: ORAL | 90 days supply | Qty: 90 | Fill #1 | Status: AC

## 2021-03-26 MED FILL — Famotidine Tab 40 MG: ORAL | 90 days supply | Qty: 90 | Fill #1 | Status: AC

## 2021-03-27 ENCOUNTER — Telehealth: Payer: Self-pay | Admitting: Cardiology

## 2021-03-27 ENCOUNTER — Other Ambulatory Visit: Payer: Medicare Other

## 2021-03-27 ENCOUNTER — Other Ambulatory Visit: Payer: Self-pay

## 2021-03-27 DIAGNOSIS — I5022 Chronic systolic (congestive) heart failure: Secondary | ICD-10-CM | POA: Diagnosis not present

## 2021-03-27 LAB — BASIC METABOLIC PANEL
BUN/Creatinine Ratio: 9 — ABNORMAL LOW (ref 10–24)
BUN: 11 mg/dL (ref 8–27)
CO2: 22 mmol/L (ref 20–29)
Calcium: 9.1 mg/dL (ref 8.6–10.2)
Chloride: 103 mmol/L (ref 96–106)
Creatinine, Ser: 1.19 mg/dL (ref 0.76–1.27)
Glucose: 89 mg/dL (ref 65–99)
Potassium: 4.3 mmol/L (ref 3.5–5.2)
Sodium: 139 mmol/L (ref 134–144)
eGFR: 62 mL/min/{1.73_m2} (ref >59)

## 2021-03-27 NOTE — Telephone Encounter (Signed)
Tony Margarita, MD  03/24/2021  6:06 PM EDT      Cardiac MRI showed mildly enlarged LV with normal LVF and mildly thickened heart muscle, mildly leaky AV and MV and mildly dilated ascending aorta at17mm.  Repeat cardiac/chest MRI MRA in 1 year for AI and dilated aorta  The patient has been notified of the result and verbalized understanding.  All questions (if any) were answered. Antonieta Iba, RN 03/27/2021 12:07 PM

## 2021-03-27 NOTE — Telephone Encounter (Signed)
Patient states he is returning a call to Dr. Theodosia Blender nurse.

## 2021-04-15 ENCOUNTER — Other Ambulatory Visit (HOSPITAL_COMMUNITY): Payer: Self-pay

## 2021-05-05 ENCOUNTER — Other Ambulatory Visit: Payer: Self-pay | Admitting: Allergy and Immunology

## 2021-05-05 ENCOUNTER — Other Ambulatory Visit (HOSPITAL_COMMUNITY): Payer: Self-pay

## 2021-05-05 MED ORDER — MONTELUKAST SODIUM 10 MG PO TABS
10.0000 mg | ORAL_TABLET | Freq: Every day | ORAL | 0 refills | Status: DC
  Filled 2021-05-05: qty 90, 90d supply, fill #0

## 2021-05-15 DIAGNOSIS — M79662 Pain in left lower leg: Secondary | ICD-10-CM | POA: Diagnosis not present

## 2021-05-16 ENCOUNTER — Other Ambulatory Visit (HOSPITAL_COMMUNITY): Payer: Self-pay

## 2021-06-05 ENCOUNTER — Other Ambulatory Visit: Payer: Self-pay | Admitting: Cardiology

## 2021-06-05 ENCOUNTER — Other Ambulatory Visit (HOSPITAL_COMMUNITY): Payer: Self-pay

## 2021-06-05 MED ORDER — APIXABAN 5 MG PO TABS
5.0000 mg | ORAL_TABLET | Freq: Two times a day (BID) | ORAL | 1 refills | Status: DC
  Filled 2021-06-05: qty 60, 30d supply, fill #0
  Filled 2021-07-07: qty 60, 30d supply, fill #1
  Filled 2021-08-11: qty 60, 30d supply, fill #2
  Filled 2021-09-16: qty 60, 30d supply, fill #3

## 2021-06-05 NOTE — Telephone Encounter (Signed)
Prescription refill request for Eliquis received. Indication:Afib Last office visit:12/24/20 ([facility])  Scr: 1.07 (03/18/21)  Age: 80 Weight: 83.5kg  Appropriate dose and refill sent to requested pharmacy.

## 2021-06-09 ENCOUNTER — Other Ambulatory Visit (HOSPITAL_COMMUNITY): Payer: Self-pay

## 2021-06-09 MED FILL — Amiodarone HCl Tab 200 MG: ORAL | 90 days supply | Qty: 90 | Fill #1 | Status: AC

## 2021-06-09 MED FILL — Spironolactone Tab 25 MG: ORAL | 90 days supply | Qty: 45 | Fill #1 | Status: AC

## 2021-06-17 ENCOUNTER — Other Ambulatory Visit (HOSPITAL_COMMUNITY): Payer: Self-pay

## 2021-06-18 ENCOUNTER — Other Ambulatory Visit (HOSPITAL_COMMUNITY): Payer: Self-pay

## 2021-06-18 MED FILL — Famotidine Tab 40 MG: ORAL | 90 days supply | Qty: 90 | Fill #2 | Status: AC

## 2021-06-26 ENCOUNTER — Other Ambulatory Visit (HOSPITAL_COMMUNITY): Payer: Self-pay

## 2021-06-26 DIAGNOSIS — H1033 Unspecified acute conjunctivitis, bilateral: Secondary | ICD-10-CM | POA: Diagnosis not present

## 2021-06-26 DIAGNOSIS — J069 Acute upper respiratory infection, unspecified: Secondary | ICD-10-CM | POA: Diagnosis not present

## 2021-06-26 MED ORDER — CIPROFLOXACIN HCL 0.3 % OP SOLN
1.0000 [drp] | OPHTHALMIC | 1 refills | Status: DC
  Filled 2021-06-26: qty 10, 4d supply, fill #0

## 2021-06-27 ENCOUNTER — Other Ambulatory Visit (HOSPITAL_COMMUNITY): Payer: Self-pay

## 2021-07-01 ENCOUNTER — Other Ambulatory Visit (HOSPITAL_COMMUNITY): Payer: Self-pay

## 2021-07-01 ENCOUNTER — Other Ambulatory Visit: Payer: Self-pay

## 2021-07-01 ENCOUNTER — Ambulatory Visit: Payer: Medicare Other | Admitting: Allergy and Immunology

## 2021-07-01 VITALS — BP 90/60 | HR 118 | Temp 98.2°F | Resp 18 | Ht 66.0 in | Wt 185.0 lb

## 2021-07-01 DIAGNOSIS — J4540 Moderate persistent asthma, uncomplicated: Secondary | ICD-10-CM

## 2021-07-01 DIAGNOSIS — K219 Gastro-esophageal reflux disease without esophagitis: Secondary | ICD-10-CM | POA: Diagnosis not present

## 2021-07-01 DIAGNOSIS — I480 Paroxysmal atrial fibrillation: Secondary | ICD-10-CM

## 2021-07-01 DIAGNOSIS — J069 Acute upper respiratory infection, unspecified: Secondary | ICD-10-CM

## 2021-07-01 DIAGNOSIS — J3089 Other allergic rhinitis: Secondary | ICD-10-CM

## 2021-07-01 MED ORDER — OMEPRAZOLE 40 MG PO CPDR
40.0000 mg | DELAYED_RELEASE_CAPSULE | Freq: Two times a day (BID) | ORAL | 5 refills | Status: DC
  Filled 2021-07-01 – 2021-07-07 (×2): qty 60, 30d supply, fill #0

## 2021-07-01 MED ORDER — FAMOTIDINE 40 MG PO TABS
40.0000 mg | ORAL_TABLET | Freq: Every evening | ORAL | 1 refills | Status: DC
  Filled 2021-07-01 – 2021-09-24 (×2): qty 90, 90d supply, fill #0
  Filled 2021-12-22: qty 90, 90d supply, fill #1

## 2021-07-01 MED ORDER — FLOVENT DISKUS 250 MCG/BLIST IN AEPB
1.0000 | INHALATION_SPRAY | Freq: Two times a day (BID) | RESPIRATORY_TRACT | 5 refills | Status: DC
  Filled 2021-07-01: qty 60, 30d supply, fill #0

## 2021-07-01 MED ORDER — METHYLPREDNISOLONE ACETATE 80 MG/ML IJ SUSP
80.0000 mg | Freq: Once | INTRAMUSCULAR | Status: AC
  Administered 2021-07-01: 80 mg via INTRAMUSCULAR

## 2021-07-01 MED ORDER — TRIAMCINOLONE ACETONIDE 55 MCG/ACT NA AERO
2.0000 | INHALATION_SPRAY | Freq: Every day | NASAL | 5 refills | Status: AC | PRN
  Filled 2021-07-01: qty 16.5, fill #0

## 2021-07-01 MED ORDER — MONTELUKAST SODIUM 10 MG PO TABS
10.0000 mg | ORAL_TABLET | Freq: Every day | ORAL | 1 refills | Status: DC
  Filled 2021-07-01 – 2021-07-31 (×2): qty 90, 90d supply, fill #0
  Filled 2021-11-04: qty 90, 90d supply, fill #1

## 2021-07-01 MED ORDER — METOPROLOL SUCCINATE ER 25 MG PO TB24
25.0000 mg | ORAL_TABLET | Freq: Every day | ORAL | 0 refills | Status: DC
  Filled 2021-07-01: qty 2, 2d supply, fill #0

## 2021-07-01 MED ORDER — LORATADINE 10 MG PO TABS
10.0000 mg | ORAL_TABLET | Freq: Two times a day (BID) | ORAL | 5 refills | Status: DC | PRN
  Filled 2021-07-01: qty 30, 15d supply, fill #0

## 2021-07-01 NOTE — Patient Instructions (Addendum)
   1. DISCONTINUE Advair. START Flovent 250 - 1 inhalation 2 times per day  2. Depomedrol 80 IM delivered in clinic today  3. Continue Singulair 10 mg daily    3. Continue to Treat reflux:   A. INCREASE Omeprazole 40mg  2 times per day    B. Famotidine 40 mg in PM  4.  START Toprol 25 mg now and 25 mg tomorrow morning  5. Visit with Dr. Radford Pax on Thursday  6. Continue Nasal saline, Mucinex DM, ibuprofen, Claritin, Nasacort, and ProAir HFA if needed  7. Further evaluation???

## 2021-07-01 NOTE — Progress Notes (Signed)
[place] [facility] [place] [facility] [place]   Follow-up Note  Referring Provider: Alroy Dust, L.Marlou Sa, MD Primary Provider: Alroy Dust, Carlean Jews.Marlou Sa, MD Date of Office Visit: 07/01/2021  Subjective:   Tony Roberts (DOB: 1941/06/17) is a 80 y.o. male who returns to the [facility] [place] on 07/01/2021 in re-evaluation of the following:  HPI: Tony Roberts returns to this clinic in evaluation of asthma, allergic rhinitis, reflux, and history of atrial fibrillation.  His last visit to this clinic with me was 19 August 2020 and he visited with our nurse practitioner on 26 December 2020.  He was doing wonderful with both his upper and lower airway and his reflux disease while consistently using medical therapy directed against respiratory tract inflammation and reflux.  He did not require systemic steroid or an antibiotic and his use of a short acting bronchodilator was less than twice a week and he could exercise without any difficulty.  However, on 21 June 2021, he developed a runny nose and sneezing and watery eyes and coughing.  His coughing has been pretty unrelenting since that point in time and he is losing some sleep because of this issue.  He does not have any ugly nasal discharge or anosmia or decreased ability to taste but he does have a sore throat.  He has not been having any increased reflux symptoms.  He checked himself for COVID and had a negative COVID swab.  Significantly, he has apparently redeveloped a fast heart rate.  When his atrial fibrillation was cardioverted earlier this year his rate was around 50-60 for which she had his metoprolol discontinued and his rate would be somewhere from 60-90 apparently in sinus rhythm.  He has noticed that since he has been sick starting 21 June 2021 his heart rate has been above 100 on a pretty consistent basis.  Allergies as of 07/01/2021       Reactions   Lisinopril    Other reaction(s): headache      Medication List     Advair Diskus 250-50 MCG/ACT Aepb Generic drug: fluticasone-salmeterol INHALE 1 PUFF BY MOUTH ONCE TO TWICE A DAY TO PREVENT COUGH OR WHEEZE. RINSE, GARGLE, AND SPIT AFTER USE.   albuterol 108 (90 Base) MCG/ACT inhaler Commonly known as: Proventil HFA Inhale two puffs every four to six hours as needed for cough or wheeze.   amiodarone 200 MG tablet Commonly known as: PACERONE TAKE 1 TABLET (200 MG TOTAL) BY MOUTH DAILY.   ciprofloxacin 0.3 % ophthalmic solution Commonly known as: CILOXAN Place 1-2 drops into affected eye(s) every 2 (two) hours for 7 days.   Eliquis 5 MG Tabs tablet Generic drug: apixaban Take 1 tablet (5 mg total) by mouth 2 (two) times daily.   esomeprazole 40 MG capsule Commonly known as: NEXIUM Take 40 mg by mouth See admin instructions. Take 40 mg by mouth in the morning before breakfast and an additional 40 mg once a day as needed for recurring heartburn   esomeprazole 40 MG capsule Commonly known as: NEXIUM TAKE 1 CAPSULE (40 MG TOTAL) BY MOUTH 2 (TWO) TIMES DAILY BEFORE A MEAL.   famotidine 40 MG tablet Commonly known as: PEPCID TAKE 1 TABLET BY MOUTH ONCE DAILY AT BEDTIME AS NEEDED   fluorouracil 5 % cream Commonly known as: EFUDEX APPLY 1 APPLICATION ON THE SKIN TWICE A DAY FOR 2 WEEKS.   furosemide 20 MG tablet Commonly known as: LASIX TAKE 1 TABLET (20 MG TOTAL) BY MOUTH DAILY.  levothyroxine 50 MCG tablet Commonly known as: SYNTHROID Take 1 tablet (50 mcg total) by mouth in the morning on an empty stomach   losartan 25 MG tablet Commonly known as: COZAAR TAKE 1 TABLET (25 MG TOTAL) BY MOUTH DAILY.   losartan 25 MG tablet Commonly known as: COZAAR TAKE 1 TABLET (25 MG TOTAL) BY MOUTH DAILY.   montelukast 10 MG tablet Commonly known as: SINGULAIR TAKE 1 TABLET (10 MG TOTAL) BY MOUTH AT BEDTIME.   potassium chloride SA 20 MEQ tablet Commonly known as: KLOR-CON Take 1 tablet (20 mEq total) by mouth daily.   spironolactone 25  MG tablet Commonly known as: ALDACTONE Take 0.5 tablets (12.5 mg total) by mouth daily.   spironolactone 25 MG tablet Commonly known as: ALDACTONE TAKE 0.5 TABLETS (12.5 MG TOTAL) BY MOUTH DAILY.   triamcinolone 55 MCG/ACT Aero nasal inhaler Commonly known as: NASACORT Place 1 spray into the nose daily as needed (for allergies or rhinitis).    Past Medical History:  Diagnosis Date   Acquired dilation of ascending aorta and aortic root ([place])    2mm by echo 2022 and 60mm by cardiac MRI 03/2021   Aortic insufficiency    mild by cardiac MRI 03/2021   Arthritis    Asthma    last asthma attack at age 19   Dilated cardiomyopathy ([place])    likely tachy mediated from atrial fibrillation.  LVF normalized on echo 01/2021   GERD (gastroesophageal reflux disease)    Headache    Mitral regurgitation    mild by cardiac MRI 03/2021   Other specified iron deficiency anemias    PAC (premature atrial contraction)    occasional PAC's   PAF (paroxysmal atrial fibrillation) ([place])    s/p TEE/DCCV 08/2020   TIA (transient ischemic attack)    3 TIAs in last 1 month.  Each episode last about 30 minutes, symptoms was one-sided numbness and facial droop and speech problems. MRI 08/2020 showed A tiny chronic infarct     Past Surgical History:  Procedure Laterality Date   CARDIOVERSION N/A 08/27/2020   Procedure: CARDIOVERSION;  Surgeon: Dorothy Spark, MD;  Location: [facility] ENDOSCOPY;  Service: Cardiovascular;  Laterality: N/A;   CATARACT EXTRACTION     HERNIA REPAIR     SHOULDER CLOSED REDUCTION Left 08/23/2020   Procedure: CLOSED REDUCTION SHOULDER;  Surgeon: [name], MD;  Location: [place];  Service: Orthopedics;  Laterality: Left;   SPINE SURGERY  10/29/2016   TEE WITHOUT CARDIOVERSION N/A 08/27/2020   Procedure: TRANSESOPHAGEAL ECHOCARDIOGRAM (TEE);  Surgeon: Dorothy Spark, MD;  Location: [facility] ENDOSCOPY;  Service: Cardiovascular;  Laterality: N/A;   TONSILLECTOMY      Review of systems  negative except as noted in HPI / PMHx or noted below:  Review of Systems  Constitutional: Negative.   HENT: Negative.    Eyes: Negative.   Respiratory: Negative.    Cardiovascular: Negative.   Gastrointestinal: Negative.   Genitourinary: Negative.   Musculoskeletal: Negative.   Skin: Negative.   Neurological: Negative.   Endo/Heme/Allergies: Negative.   Psychiatric/Behavioral: Negative.      Objective:   Vitals:   07/01/21 1550  BP: 90/60  Pulse: (!) 118  Resp: 18  Temp: 98.2 F (36.8 C)  SpO2: 97%   Height: 5\' 6"  (167.6 cm)  Weight: 185 lb (83.9 kg)   Physical Exam Constitutional:      Appearance: He is not diaphoretic.  HENT:     Head: Normocephalic.  Right Ear: Tympanic membrane, ear canal and external ear normal.     Left Ear: Tympanic membrane, ear canal and external ear normal.     Nose: Nose normal. No mucosal edema or rhinorrhea.     Mouth/Throat:     Pharynx: Uvula midline. No oropharyngeal exudate.  Eyes:     Conjunctiva/sclera: Conjunctivae normal.  Neck:     Thyroid: No thyromegaly.     Trachea: Trachea normal. No tracheal tenderness or tracheal deviation.  Cardiovascular:     Rate and Rhythm: Tachycardia present. Rhythm irregular.     Heart sounds: Normal heart sounds, S1 normal and S2 normal. No murmur heard. Pulmonary:     Effort: No respiratory distress.     Breath sounds: Normal breath sounds. No stridor. No wheezing or rales.  Lymphadenopathy:     Head:     Right side of head: No tonsillar adenopathy.     Left side of head: No tonsillar adenopathy.     Cervical: No cervical adenopathy.  Skin:    Findings: No erythema or rash.     Nails: There is no clubbing.  Neurological:     Mental Status: He is alert.    Diagnostics: none  Assessment and Plan:   1. Not well controlled moderate persistent asthma   2. Viral upper respiratory tract infection with cough   3. Other allergic rhinitis   4. LPRD (laryngopharyngeal reflux disease)    5. PAF (paroxysmal atrial fibrillation) ([place])     1. DISCONTINUE Advair. START Flovent 250 - 1 inhalation 2 times per day  2. Depomedrol 80 IM delivered in clinic today  3. Continue Singulair 10 mg daily    3. Continue to Treat reflux:   A. INCREASE Omeprazole 40mg  2 times per day    B. Famotidine 40 mg in PM  4.  START Toprol 25 mg now and 25 mg tomorrow morning  5. Visit with Dr. Radford Pax on Thursday  6. Continue Nasal saline, Mucinex DM, ibuprofen, Claritin, Nasacort, and ProAir HFA if needed  7. Further evaluation???  Tony Roberts has some form of viral respiratory tract infection that is associated with a negative COVID swab and we will treat him with a systemic steroid.  Because he is in atrial fibrillation once again with a fast rate we will discontinue his salmeterol component of Advair and just have him use inhaled fluticasone.  As well, I have asked him to restart a low-dose of Toprol tonight and tomorrow.  In the past he has been on Toprol up to 100 mg 1 time per day.  He has an appointment to see his cardiologist, Dr. Radford Pax, on Thursday.  He is already on Eliquis.  He will keep in contact with me noting his response to this approach.  If he does well I will see him back in this clinic in 6 months or earlier if there is a problem.  [name], MD Allergy / Immunology [place]

## 2021-07-02 ENCOUNTER — Encounter: Payer: Self-pay | Admitting: Allergy and Immunology

## 2021-07-02 ENCOUNTER — Other Ambulatory Visit (HOSPITAL_COMMUNITY): Payer: Self-pay

## 2021-07-02 NOTE — Progress Notes (Signed)
Cardiology Office Note:    Date:  07/03/2021   ID:  Tony Roberts, DOB 02-10-1941, MRN [id number]  PCP:  Aurea Graff.Marlou Sa, MD  Cardiologist:  Fransico Him, MD    Referring MD: Alroy Dust, Carlean Jews.Marlou Sa, MD   Chief Complaint  Patient presents with   Atrial Fibrillation   Congestive Heart Failure   Chest Pain     History of Present Illness:    Tony Roberts is a 80 y.o. male with a hx of HTN, GERD, asthma, & diverticulosis. He was hospitalized in early Dec 2022 with 4-5 day hx of palpitations, DOE, cough and was found to be in afib with RVR by his allergist and sent to the ER.  He also has had 3 TIAs, each lasting 30 min with one-sided numbness and facial droop with speech problems and spontaneously resolved.  MRI done 11/15 /21 showed no acute CVA but small chronic infarct in the left cerebellum.  He underwent TEE/DCCV to NSR and transitioned to PO Amio 200mg  daily.  He was continued on Toprol and started on Eliquis 5mg  BID.  2D echo 08/22/2020 showed severe LV dysfunction with EF 25-30% felt to possibly be due to tachy mediated DCM with  LVF with severe LAE and mild AR by TTE.  TEE showed a small PFO with L>R shunting and moderate AR.   He was seen back by Truitt Merle, NP and was doing well.  Maintaining sinus but HR slow in the 40's and Toprol was cut back to 50mg  daily.  At [place] with me in Jan 2022 and was found to be back in afib and was referred to afib clinic.  Apparently the he day after I saw him he was noted to have a drop in his HR from 90's to 50's and was in SR in afib clinic.  He is not an afib candidate due to severe LAE and it was felt that Amio or Tikosyn would help maintain NSR due to how enlarged the lA was.  It was decided to keep him on Amio 200mg  daily.  At last OV he was complaining of chest pain and a lexiscan myoview showed no ischemia. His Toprol was decreased to 25mg  daily due to bradycardia.  2D echo showed normalization of LVF with EF 60-65%, severe LAE and mild MR/AR and  dilated ascending aorta at 96mm.  cMRI 02/2021 showed normal LVF with EF 60%, mild ASSH, normal RV, mild AR and MR and mildly dilated ascending aorta at 10mm.   He is here today for followup and is doing well except for a bad cold that he was dealing with for 2 week.  During that time his HR was elevated in the 80-90's and then he went to see his allergist and HR was 118bpm and he was told to restart his Toprol XL 25mg  daily which he took 2 days but not today.  He also has noticed that his BP has running soft as low as 88/59mmHg. He denies any chest pain or pressure, SOB, DOE(except with his cough), PND, orthopnea, LE edema, dizziness, palpitations or syncope. He is compliant with his meds and is tolerating meds with no SE.     Past Medical History:  Diagnosis Date   Acquired dilation of ascending aorta and aortic root ([place])    40mm by echo 2022 and 15mm by cardiac MRI 03/2021   Aortic insufficiency    mild by cardiac MRI 03/2021   Arthritis    Asthma    last asthma attack  at age 25   Dilated cardiomyopathy ([place])    likely tachy mediated from atrial fibrillation.  LVF normalized on echo 01/2021   GERD (gastroesophageal reflux disease)    Headache    Mitral regurgitation    mild by cardiac MRI 03/2021   Other specified iron deficiency anemias    PAC (premature atrial contraction)    occasional PAC's   PAF (paroxysmal atrial fibrillation) ([place])    s/p TEE/DCCV 08/2020   TIA (transient ischemic attack)    3 TIAs in last 1 month.  Each episode last about 30 minutes, symptoms was one-sided numbness and facial droop and speech problems. MRI 08/2020 showed A tiny chronic infarct     Past Surgical History:  Procedure Laterality Date   CARDIOVERSION N/A 08/27/2020   Procedure: CARDIOVERSION;  Surgeon: Dorothy Spark, MD;  Location: [facility] ENDOSCOPY;  Service: Cardiovascular;  Laterality: N/A;   CATARACT EXTRACTION     HERNIA REPAIR     SHOULDER CLOSED REDUCTION Left 08/23/2020   Procedure: CLOSED  REDUCTION SHOULDER;  Surgeon: [name], MD;  Location: [place];  Service: Orthopedics;  Laterality: Left;   SPINE SURGERY  10/29/2016   TEE WITHOUT CARDIOVERSION N/A 08/27/2020   Procedure: TRANSESOPHAGEAL ECHOCARDIOGRAM (TEE);  Surgeon: Dorothy Spark, MD;  Location: [facility] ENDOSCOPY;  Service: Cardiovascular;  Laterality: N/A;   TONSILLECTOMY      Current Medications: No outpatient medications have been marked as taking for the 07/03/21 encounter (Office Visit) with Sueanne Margarita, MD.     Allergies:   Lisinopril   Social History   Socioeconomic History   Marital status: Married    Spouse name: Not on file   Number of children: Not on file   Years of education: Not on file   Highest education level: Not on file  Occupational History   Not on file  Tobacco Use   Smoking status: Never   Smokeless tobacco: Never  Vaping Use   Vaping Use: Never used  Substance and Sexual Activity   Alcohol use: No   Drug use: No   Sexual activity: Not on file  Other Topics Concern   Not on file  Social History Narrative   Not on file   Social Determinants of Health   Financial Resource Strain: Not on file  Food Insecurity: Not on file  Transportation Needs: Not on file  Physical Activity: Not on file  Stress: Not on file  Social Connections: Not on file     Family History: The patient's family history includes Alzheimer's disease in his mother; Asthma in his father.  ROS:   Please see the history of present illness.    ROS  All other systems reviewed and negative.   EKGs/Labs/Other Studies Reviewed:    The following studies were reviewed today:  TEE 08/27/2020 IMPRESSIONS    1. Left ventricular ejection fraction, by estimation, is 20 to 25%. The  left ventricle has severely decreased function. The left ventricle  demonstrates global hypokinesis. The left ventricular internal cavity size  was mildly dilated. Left ventricular  diastolic function could not be evaluated.    2. Right ventricular systolic function is normal. The right ventricular  size is normal.   3. Left atrial size was severely dilated. No left atrial/left atrial  appendage thrombus was detected.   4. Right atrial size was severely dilated.   5. The mitral valve is degenerative. Mild to moderate mitral valve  regurgitation. No evidence of mitral stenosis.   6.  The aortic valve is tricuspid. Aortic valve regurgitation is moderate.  No aortic stenosis is present.   7. The inferior vena cava is normal in size with greater than 50%  respiratory variability, suggesting right atrial pressure of 3 mmHg.   8. Evidence of atrial level shunting detected by color flow Doppler.  There is a small patent foramen ovale with predominantly left to right  shunting across the atrial septum.   2D echo 08/22/2020 IMPRESSIONS    1. Left ventricular ejection fraction, by estimation, is 25 to 30%. The  left ventricle has severely decreased function. The left ventricle  demonstrates global hypokinesis. There is moderate left ventricular  hypertrophy of the basal-septal segment. Left  ventricular diastolic function could not be evaluated.   2. Right ventricular systolic function is normal. The right ventricular  size is normal. Tricuspid regurgitation signal is inadequate for assessing  PA pressure.   3. Left atrial size was severely dilated.   4. Right atrial size was mildly dilated.   5. The mitral valve is normal in structure. Mild mitral valve  regurgitation. No evidence of mitral stenosis.   6. The aortic valve is tricuspid. Aortic valve regurgitation is mild.  Mild aortic valve sclerosis is present, with no evidence of aortic valve  stenosis.   7. Aortic dilatation noted. There is mild dilatation of the ascending  aorta, measuring 40 mm.   8. The inferior vena cava is normal in size with greater than 50%  respiratory variability, suggesting right atrial pressure of 3 mmHg.   EKG:  EKG is ordered  today and showed sinus bradycardia at 54bpm with early R wave transition in the precordial leads  Recent Labs: 08/21/2020: ALT 20; B Natriuretic Peptide 328.4; TSH 4.301 08/26/2020: Magnesium 2.0 03/18/2021: Hemoglobin 13.0; Platelets 132 03/27/2021: BUN 11; Creatinine, Ser 1.19; Potassium 4.3; Sodium 139   Recent Lipid Panel    Component Value Date/Time   CHOL 187 08/21/2020 1629   TRIG 64 08/21/2020 1629   HDL 37 (L) 08/21/2020 1629   CHOLHDL 5.1 08/21/2020 1629   VLDL 13 08/21/2020 1629   LDLCALC 137 (H) 08/21/2020 1629    CHA2DS2-VASc Score = 5 [CHF History: 0, HTN History: 1, Diabetes History: 0, Stroke History: 2, Vascular Disease History: 0, Age Score: 2, Gender Score: 0].  Therefore, the patient's annual risk of stroke is 7.2 %.        Physical Exam:    VS:  BP (!) 104/54   Pulse (!) 54   Ht 5\' 6"  (1.676 m)   Wt 183 lb 12.8 oz (83.4 kg)   SpO2 98%   BMI 29.67 kg/m     Wt Readings from Last 3 Encounters:  07/03/21 183 lb 12.8 oz (83.4 kg)  07/01/21 185 lb (83.9 kg)  01/14/21 184 lb (83.5 kg)     GEN: Well nourished, well developed in no acute distress HEENT: Normal NECK: No JVD; No carotid bruits LYMPHATICS: No lymphadenopathy CARDIAC:RRR, no murmurs, rubs, gallops RESPIRATORY:  Clear to auscultation without rales, wheezing or rhonchi  ABDOMEN: Soft, non-tender, non-distended MUSCULOSKELETAL:  No edema; No deformity  SKIN: Warm and dry NEUROLOGIC:  Alert and oriented x 3 PSYCHIATRIC:  Normal affect    ASSESSMENT:    1. PAF (paroxysmal atrial fibrillation) ([place])   2. Chronic systolic heart failure ([place])   3. TIA (transient ischemic attack)   4. NSVT (nonsustained ventricular tachycardia)   5. Chest pain of uncertain etiology    PLAN:    In  order of problems listed above:  1.  Paroxysmal atrial fibrillation -S/P TEE/DCCV and on Amio -Toprol cut back to 25mg  daily due to significant bradycardia -it was felt that due to severe LAE he was not a candidate  for afib ablation -he is asymptomatic when in afib and it was recommended to continue Amio since he is rate controlled when he goes into afib -he haad HRs in the 90-110's recently during a severe cold and took Toprol for 2 days but now is off of it -he denies any bleeding problems on DOAC -continue prescription drug management with Amio 200mg  daily and ELiquis 5mg  BID>refilled -I told him not to take any further Toprol due to bradycardia -check TSH, BMET and CBC  2.  Chronic systolic CHF -EF 30-09% on echo and presumed tachy mediated -LVF normalized to 60% by echo and cMRI -he does not appear volume overloaded on exam today -continue prescription drug management but decrease Losartan to 12.5mg  daily due to soft BP -stop spiro due to soft BP -he has not required any lasix  3.  Hx of TIA -likely related to #1 -continue Eliquis  4.  NSVT -controlled on BB and Amio  5.  Chest pain -Leane Call 12/2020 with no ischemia   Medication Adjustments/Labs and Tests Ordered: Current medicines are reviewed at length with the patient today.  Concerns regarding medicines are outlined above.  Orders Placed This Encounter  Procedures   EKG 12-Lead    No orders of the defined types were placed in this encounter.   Signed, Fransico Him, MD  07/03/2021 8:51 AM    [place]

## 2021-07-03 ENCOUNTER — Other Ambulatory Visit: Payer: Self-pay

## 2021-07-03 ENCOUNTER — Encounter: Payer: Self-pay | Admitting: Cardiology

## 2021-07-03 ENCOUNTER — Other Ambulatory Visit (HOSPITAL_COMMUNITY): Payer: Self-pay

## 2021-07-03 ENCOUNTER — Ambulatory Visit: Payer: Medicare Other | Admitting: Cardiology

## 2021-07-03 VITALS — BP 104/54 | HR 54 | Ht 66.0 in | Wt 183.8 lb

## 2021-07-03 DIAGNOSIS — I4729 Other ventricular tachycardia: Secondary | ICD-10-CM | POA: Diagnosis not present

## 2021-07-03 DIAGNOSIS — G459 Transient cerebral ischemic attack, unspecified: Secondary | ICD-10-CM

## 2021-07-03 DIAGNOSIS — I480 Paroxysmal atrial fibrillation: Secondary | ICD-10-CM | POA: Diagnosis not present

## 2021-07-03 DIAGNOSIS — I5022 Chronic systolic (congestive) heart failure: Secondary | ICD-10-CM

## 2021-07-03 DIAGNOSIS — R079 Chest pain, unspecified: Secondary | ICD-10-CM

## 2021-07-03 LAB — TSH: TSH: 1.16 u[IU]/mL (ref 0.450–4.500)

## 2021-07-03 LAB — BASIC METABOLIC PANEL
BUN/Creatinine Ratio: 12 (ref 10–24)
BUN: 14 mg/dL (ref 8–27)
CO2: 20 mmol/L (ref 20–29)
Calcium: 8.7 mg/dL (ref 8.6–10.2)
Chloride: 102 mmol/L (ref 96–106)
Creatinine, Ser: 1.2 mg/dL (ref 0.76–1.27)
Glucose: 74 mg/dL (ref 70–99)
Potassium: 4.3 mmol/L (ref 3.5–5.2)
Sodium: 136 mmol/L (ref 134–144)
eGFR: 61 mL/min/{1.73_m2} (ref >59)

## 2021-07-03 LAB — CBC
Hematocrit: 36.4 % — ABNORMAL LOW (ref 37.5–51.0)
Hemoglobin: 12.1 g/dL — ABNORMAL LOW (ref 13.0–17.7)
MCH: 28.7 pg (ref 26.6–33.0)
MCHC: 33.2 g/dL (ref 31.5–35.7)
MCV: 87 fL (ref 79–97)
Platelets: 159 10*3/uL (ref 150–450)
RBC: 4.21 x10E6/uL (ref 4.14–5.80)
RDW: 13.2 % (ref 11.6–15.4)
WBC: 8.4 10*3/uL (ref 3.4–10.8)

## 2021-07-03 MED ORDER — LOSARTAN POTASSIUM 25 MG PO TABS
12.5000 mg | ORAL_TABLET | Freq: Every day | ORAL | 1 refills | Status: DC
  Filled 2021-07-03: qty 45, 90d supply, fill #0
  Filled 2021-11-17: qty 45, 90d supply, fill #1

## 2021-07-03 NOTE — Patient Instructions (Signed)
Medication Instructions:   STOP TAKING SPIRONOLACTONE NOW  DECREASE YOUR LOSARTAN TO TAKING 12.5 MG BY MOUTH DAILY  *If you need a refill on your cardiac medications before your next appointment, please call your pharmacy*   Lab Work:  TODAY--BMET, CBC, AND TSH  If you have labs (blood work) drawn today and your tests are completely normal, you will receive your results only by: [place] (if you have MyChart) OR A paper copy in the mail If you have any lab test that is abnormal or we need to change your treatment, we will call you to review the results.   Follow-Up: At [facility], you and your health needs are our priority.  As part of our continuing mission to provide you with exceptional heart care, we have created designated Provider Care Teams.  These Care Teams include your primary Cardiologist (physician) and Advanced Practice Providers (APPs -  Physician Assistants and Nurse Practitioners) who all work together to provide you with the care you need, when you need it.  We recommend signing up for the patient portal called "MyChart".  Sign up information is provided on this After Visit Summary.  MyChart is used to connect with patients for Virtual Visits (Telemedicine).  Patients are able to view lab/test results, encounter notes, upcoming appointments, etc.  Non-urgent messages can be sent to your provider as well.   To learn more about what you can do with MyChart, go to [email or web address].    Your next appointment:   6 month(s)  The format for your next appointment:   In Person  Provider:   Fransico Him, MD   Other Instructions  PLEASE CHECK YOUR BLOOD PRESSURE 2 TIMES A DAY AND LOG THIS FOR THE NEXT WEEK, THEN CALL THESE RESULTS INTO DR. Radford Pax HERE IN THE OFFICE THEREAFTER, AT [phone].

## 2021-07-07 ENCOUNTER — Other Ambulatory Visit: Payer: Self-pay | Admitting: Allergy and Immunology

## 2021-07-07 ENCOUNTER — Other Ambulatory Visit (HOSPITAL_COMMUNITY): Payer: Self-pay

## 2021-07-07 NOTE — Telephone Encounter (Signed)
Per avs medication is not listed please advise to refill

## 2021-07-08 ENCOUNTER — Other Ambulatory Visit (HOSPITAL_COMMUNITY): Payer: Self-pay

## 2021-07-08 MED ORDER — ESOMEPRAZOLE MAGNESIUM 40 MG PO CPDR
40.0000 mg | DELAYED_RELEASE_CAPSULE | Freq: Two times a day (BID) | ORAL | 1 refills | Status: DC
  Filled 2021-07-08: qty 180, 90d supply, fill #0

## 2021-07-09 ENCOUNTER — Telehealth: Payer: Self-pay | Admitting: Allergy and Immunology

## 2021-07-09 ENCOUNTER — Other Ambulatory Visit (HOSPITAL_COMMUNITY): Payer: Self-pay

## 2021-07-09 MED ORDER — ESOMEPRAZOLE MAGNESIUM 40 MG PO CPDR
40.0000 mg | DELAYED_RELEASE_CAPSULE | Freq: Two times a day (BID) | ORAL | 2 refills | Status: DC
  Filled 2021-07-09 – 2021-07-10 (×2): qty 180, 90d supply, fill #0

## 2021-07-09 NOTE — Telephone Encounter (Signed)
Called and spoke to patient and he expressed that there was an error on the Nexium. Per Dr. Neldon Mc I have sent in the Nexium esomeprazole 40 MG capsule  Commonly known as: NEXIUM  TAKE 1 CAPSULE (40 MG TOTAL) BY MOUTH 2 (TWO) TIMES DAILY BEFORE A MEAL.  I have sent his Nexium to [facility] outpatient pharmacy.

## 2021-07-09 NOTE — Telephone Encounter (Signed)
Patient called and said he never received a return phone call [phone]

## 2021-07-10 ENCOUNTER — Other Ambulatory Visit (HOSPITAL_COMMUNITY): Payer: Self-pay

## 2021-07-18 ENCOUNTER — Other Ambulatory Visit (HOSPITAL_COMMUNITY): Payer: Self-pay

## 2021-07-31 ENCOUNTER — Other Ambulatory Visit (HOSPITAL_COMMUNITY): Payer: Self-pay

## 2021-08-06 DIAGNOSIS — E039 Hypothyroidism, unspecified: Secondary | ICD-10-CM | POA: Diagnosis not present

## 2021-08-11 ENCOUNTER — Other Ambulatory Visit (HOSPITAL_COMMUNITY): Payer: Self-pay

## 2021-08-18 ENCOUNTER — Other Ambulatory Visit: Payer: Self-pay | Admitting: Allergy and Immunology

## 2021-08-18 ENCOUNTER — Other Ambulatory Visit (HOSPITAL_COMMUNITY): Payer: Self-pay

## 2021-08-18 MED ORDER — FLOVENT DISKUS 250 MCG/ACT IN AEPB
1.0000 | INHALATION_SPRAY | Freq: Two times a day (BID) | RESPIRATORY_TRACT | 4 refills | Status: DC
  Filled 2021-08-18: qty 60, 30d supply, fill #0
  Filled 2021-10-23: qty 60, 30d supply, fill #1
  Filled 2021-12-22: qty 60, 30d supply, fill #2

## 2021-08-22 ENCOUNTER — Other Ambulatory Visit (HOSPITAL_COMMUNITY): Payer: Self-pay

## 2021-08-22 DIAGNOSIS — M25562 Pain in left knee: Secondary | ICD-10-CM | POA: Diagnosis not present

## 2021-09-16 ENCOUNTER — Other Ambulatory Visit (HOSPITAL_COMMUNITY): Payer: Self-pay

## 2021-09-19 ENCOUNTER — Inpatient Hospital Stay (HOSPITAL_COMMUNITY): Payer: Medicare Other

## 2021-09-19 ENCOUNTER — Other Ambulatory Visit: Payer: Self-pay

## 2021-09-19 ENCOUNTER — Telehealth: Payer: Self-pay | Admitting: Cardiology

## 2021-09-19 ENCOUNTER — Encounter (HOSPITAL_COMMUNITY): Payer: Self-pay | Admitting: Emergency Medicine

## 2021-09-19 ENCOUNTER — Emergency Department (HOSPITAL_COMMUNITY): Payer: Medicare Other

## 2021-09-19 ENCOUNTER — Inpatient Hospital Stay (HOSPITAL_COMMUNITY)
Admission: EM | Admit: 2021-09-19 | Discharge: 2021-09-22 | DRG: 064 | Disposition: A | Discharge status: Home / Self Care | Payer: Medicare Other | Attending: Neurology | Admitting: Neurology

## 2021-09-19 DIAGNOSIS — J45909 Unspecified asthma, uncomplicated: Secondary | ICD-10-CM | POA: Diagnosis present

## 2021-09-19 DIAGNOSIS — Z825 Family history of asthma and other chronic lower respiratory diseases: Secondary | ICD-10-CM | POA: Diagnosis not present

## 2021-09-19 DIAGNOSIS — Z8673 Personal history of transient ischemic attack (TIA), and cerebral infarction without residual deficits: Secondary | ICD-10-CM

## 2021-09-19 DIAGNOSIS — I619 Nontraumatic intracerebral hemorrhage, unspecified: Secondary | ICD-10-CM

## 2021-09-19 DIAGNOSIS — I480 Paroxysmal atrial fibrillation: Secondary | ICD-10-CM | POA: Diagnosis not present

## 2021-09-19 DIAGNOSIS — Z9841 Cataract extraction status, right eye: Secondary | ICD-10-CM | POA: Diagnosis not present

## 2021-09-19 DIAGNOSIS — I629 Nontraumatic intracranial hemorrhage, unspecified: Secondary | ICD-10-CM | POA: Diagnosis not present

## 2021-09-19 DIAGNOSIS — R29701 NIHSS score 1: Secondary | ICD-10-CM | POA: Diagnosis present

## 2021-09-19 DIAGNOSIS — E785 Hyperlipidemia, unspecified: Secondary | ICD-10-CM | POA: Diagnosis present

## 2021-09-19 DIAGNOSIS — I771 Stricture of artery: Secondary | ICD-10-CM | POA: Diagnosis not present

## 2021-09-19 DIAGNOSIS — Z79899 Other long term (current) drug therapy: Secondary | ICD-10-CM | POA: Diagnosis not present

## 2021-09-19 DIAGNOSIS — I77810 Thoracic aortic ectasia: Secondary | ICD-10-CM | POA: Diagnosis not present

## 2021-09-19 DIAGNOSIS — I6349 Cerebral infarction due to embolism of other cerebral artery: Secondary | ICD-10-CM | POA: Diagnosis not present

## 2021-09-19 DIAGNOSIS — I6782 Cerebral ischemia: Secondary | ICD-10-CM | POA: Diagnosis not present

## 2021-09-19 DIAGNOSIS — G936 Cerebral edema: Secondary | ICD-10-CM | POA: Diagnosis present

## 2021-09-19 DIAGNOSIS — K219 Gastro-esophageal reflux disease without esophagitis: Secondary | ICD-10-CM | POA: Diagnosis present

## 2021-09-19 DIAGNOSIS — I5022 Chronic systolic (congestive) heart failure: Secondary | ICD-10-CM | POA: Diagnosis present

## 2021-09-19 DIAGNOSIS — T45515A Adverse effect of anticoagulants, initial encounter: Secondary | ICD-10-CM | POA: Diagnosis present

## 2021-09-19 DIAGNOSIS — Z7901 Long term (current) use of anticoagulants: Secondary | ICD-10-CM

## 2021-09-19 DIAGNOSIS — Z20822 Contact with and (suspected) exposure to covid-19: Secondary | ICD-10-CM | POA: Diagnosis not present

## 2021-09-19 DIAGNOSIS — I080 Rheumatic disorders of both mitral and aortic valves: Secondary | ICD-10-CM | POA: Diagnosis not present

## 2021-09-19 DIAGNOSIS — I610 Nontraumatic intracerebral hemorrhage in hemisphere, subcortical: Secondary | ICD-10-CM | POA: Diagnosis not present

## 2021-09-19 DIAGNOSIS — Z888 Allergy status to other drugs, medicaments and biological substances status: Secondary | ICD-10-CM

## 2021-09-19 DIAGNOSIS — I6523 Occlusion and stenosis of bilateral carotid arteries: Secondary | ICD-10-CM | POA: Diagnosis not present

## 2021-09-19 DIAGNOSIS — Z9842 Cataract extraction status, left eye: Secondary | ICD-10-CM | POA: Diagnosis not present

## 2021-09-19 DIAGNOSIS — M1990 Unspecified osteoarthritis, unspecified site: Secondary | ICD-10-CM | POA: Diagnosis present

## 2021-09-19 DIAGNOSIS — R42 Dizziness and giddiness: Secondary | ICD-10-CM | POA: Diagnosis not present

## 2021-09-19 DIAGNOSIS — I618 Other nontraumatic intracerebral hemorrhage: Secondary | ICD-10-CM | POA: Diagnosis present

## 2021-09-19 DIAGNOSIS — I6602 Occlusion and stenosis of left middle cerebral artery: Secondary | ICD-10-CM | POA: Diagnosis not present

## 2021-09-19 DIAGNOSIS — I110 Hypertensive heart disease with heart failure: Secondary | ICD-10-CM | POA: Diagnosis present

## 2021-09-19 DIAGNOSIS — I6389 Other cerebral infarction: Secondary | ICD-10-CM | POA: Diagnosis not present

## 2021-09-19 DIAGNOSIS — Z79890 Hormone replacement therapy (postmenopausal): Secondary | ICD-10-CM

## 2021-09-19 DIAGNOSIS — I672 Cerebral atherosclerosis: Secondary | ICD-10-CM | POA: Diagnosis present

## 2021-09-19 DIAGNOSIS — I10 Essential (primary) hypertension: Secondary | ICD-10-CM

## 2021-09-19 DIAGNOSIS — R9431 Abnormal electrocardiogram [ECG] [EKG]: Secondary | ICD-10-CM | POA: Diagnosis not present

## 2021-09-19 LAB — DIFFERENTIAL
Abs Immature Granulocytes: 0.04 10*3/uL (ref 0.00–0.07)
Basophils Absolute: 0 10*3/uL (ref 0.0–0.1)
Basophils Relative: 1 %
Eosinophils Absolute: 0.2 10*3/uL (ref 0.0–0.5)
Eosinophils Relative: 3 %
Immature Granulocytes: 1 %
Lymphocytes Relative: 13 %
Lymphs Abs: 0.7 10*3/uL (ref 0.7–4.0)
Monocytes Absolute: 0.5 10*3/uL (ref 0.1–1.0)
Monocytes Relative: 9 %
Neutro Abs: 4 10*3/uL (ref 1.7–7.7)
Neutrophils Relative %: 73 %

## 2021-09-19 LAB — PROTIME-INR
INR: 1.4 — ABNORMAL HIGH (ref 0.8–1.2)
Prothrombin Time: 16.9 seconds — ABNORMAL HIGH (ref 11.4–15.2)

## 2021-09-19 LAB — COMPREHENSIVE METABOLIC PANEL
ALT: 13 U/L (ref 0–44)
AST: 16 U/L (ref 15–41)
Albumin: 3.3 g/dL — ABNORMAL LOW (ref 3.5–5.0)
Alkaline Phosphatase: 84 U/L (ref 38–126)
Anion gap: 11 (ref 5–15)
BUN: 9 mg/dL (ref 8–23)
CO2: 22 mmol/L (ref 22–32)
Calcium: 8.4 mg/dL — ABNORMAL LOW (ref 8.9–10.3)
Chloride: 103 mmol/L (ref 98–111)
Creatinine, Ser: 1.22 mg/dL (ref 0.61–1.24)
GFR, Estimated: 60 mL/min — ABNORMAL LOW (ref >60)
Glucose, Bld: 92 mg/dL (ref 70–99)
Potassium: 4.2 mmol/L (ref 3.5–5.1)
Sodium: 136 mmol/L (ref 135–145)
Total Bilirubin: 0.7 mg/dL (ref 0.3–1.2)
Total Protein: 6.3 g/dL — ABNORMAL LOW (ref 6.5–8.1)

## 2021-09-19 LAB — I-STAT CHEM 8, ED
BUN: 12 mg/dL (ref 8–23)
Calcium, Ion: 1.1 mmol/L — ABNORMAL LOW (ref 1.15–1.40)
Chloride: 103 mmol/L (ref 98–111)
Creatinine, Ser: 1.1 mg/dL (ref 0.61–1.24)
Glucose, Bld: 88 mg/dL (ref 70–99)
HCT: 39 % (ref 39.0–52.0)
Hemoglobin: 13.3 g/dL (ref 13.0–17.0)
Potassium: 4.1 mmol/L (ref 3.5–5.1)
Sodium: 136 mmol/L (ref 135–145)
TCO2: 25 mmol/L (ref 22–32)

## 2021-09-19 LAB — CBC
HCT: 37.9 % — ABNORMAL LOW (ref 39.0–52.0)
Hemoglobin: 12.4 g/dL — ABNORMAL LOW (ref 13.0–17.0)
MCH: 29.2 pg (ref 26.0–34.0)
MCHC: 32.7 g/dL (ref 30.0–36.0)
MCV: 89.4 fL (ref 80.0–100.0)
Platelets: 151 10*3/uL (ref 150–400)
RBC: 4.24 MIL/uL (ref 4.22–5.81)
RDW: 15 % (ref 11.5–15.5)
WBC: 5.4 10*3/uL (ref 4.0–10.5)
nRBC: 0 % (ref 0.0–0.2)

## 2021-09-19 LAB — TYPE AND SCREEN
ABO/RH(D): O POS
Antibody Screen: NEGATIVE

## 2021-09-19 LAB — ETHANOL: Alcohol, Ethyl (B): <10 mg/dL (ref <10)

## 2021-09-19 LAB — URINALYSIS, ROUTINE W REFLEX MICROSCOPIC
Bacteria, UA: NONE SEEN
Bilirubin Urine: NEGATIVE
Glucose, UA: NEGATIVE mg/dL
Ketones, ur: NEGATIVE mg/dL
Leukocytes,Ua: NEGATIVE
Nitrite: NEGATIVE
Protein, ur: NEGATIVE mg/dL
Specific Gravity, Urine: 1.006 (ref 1.005–1.030)
pH: 6 (ref 5.0–8.0)

## 2021-09-19 LAB — RAPID URINE DRUG SCREEN, HOSP PERFORMED
Amphetamines: NOT DETECTED
Barbiturates: NOT DETECTED
Benzodiazepines: NOT DETECTED
Cocaine: NOT DETECTED
Opiates: NOT DETECTED
Tetrahydrocannabinol: NOT DETECTED

## 2021-09-19 LAB — HEMOGLOBIN A1C
Hgb A1c MFr Bld: 5.4 % (ref 4.8–5.6)
Mean Plasma Glucose: 108.28 mg/dL

## 2021-09-19 LAB — MAGNESIUM: Magnesium: 2.2 mg/dL (ref 1.7–2.4)

## 2021-09-19 LAB — APTT: aPTT: 38 seconds — ABNORMAL HIGH (ref 24–36)

## 2021-09-19 LAB — RESP PANEL BY RT-PCR (FLU A&B, COVID) ARPGX2
Influenza A by PCR: NEGATIVE
Influenza B by PCR: NEGATIVE
SARS Coronavirus 2 by RT PCR: NEGATIVE

## 2021-09-19 LAB — PHOSPHORUS: Phosphorus: 3.1 mg/dL (ref 2.5–4.6)

## 2021-09-19 IMAGING — CT CT HEAD W/O CM
3 of 4 series · 14 of 47 positions shown, 16 images · non-contrast
Comparison: [DATE]

CLINICAL DATA: Dizziness and left leg numbness.

EXAM:
CT HEAD WITHOUT CONTRAST
TECHNIQUE: Contiguous axial images were obtained from the base of the skull
through the vertex without intravenous contrast.

[Series 4: head 2.0 h70h · axial · 0.43mm/px · z∈[-117,+9]mm · 8 of 79 slices shown, 10 images]
[im 8/79  brain]
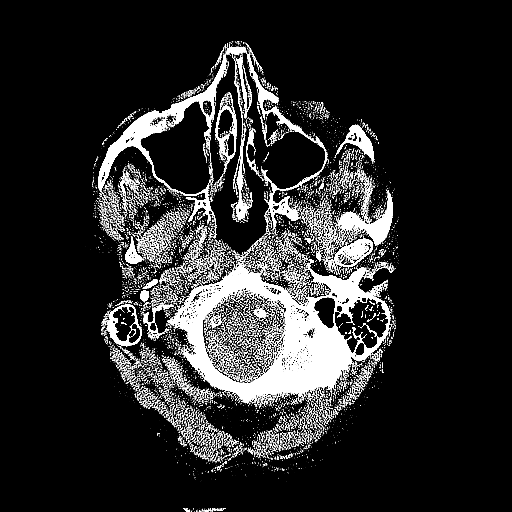
[im 8/79  bone]
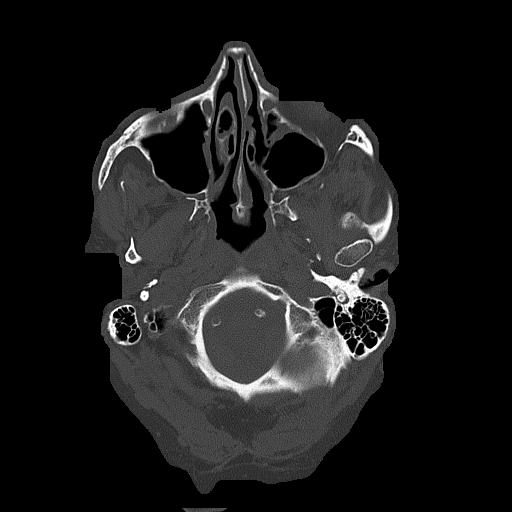
[im 16/79  brain]
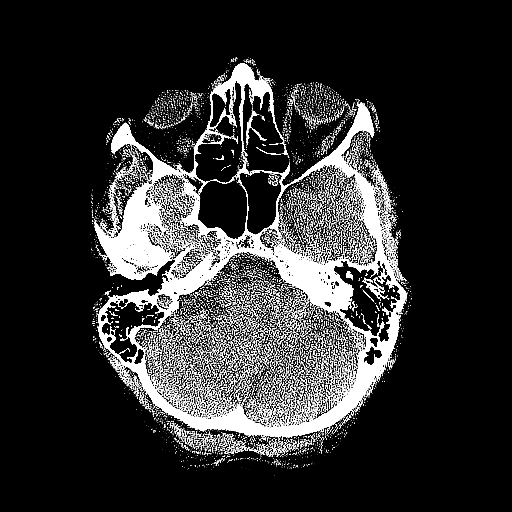
[im 24/79  brain]
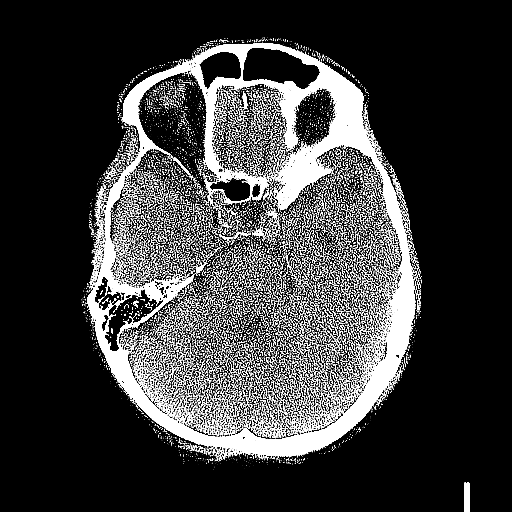
[im 36/79  brain]
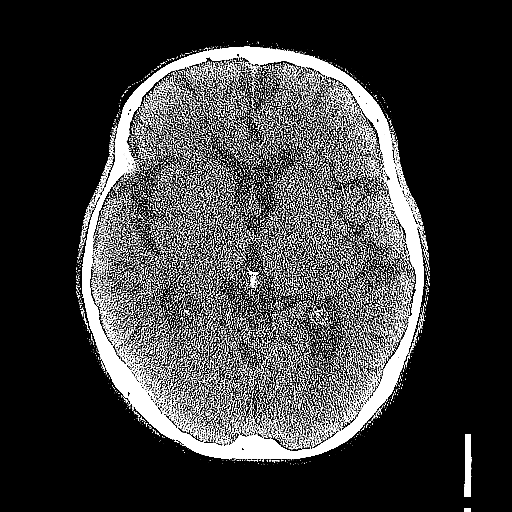
[im 43/79  brain]
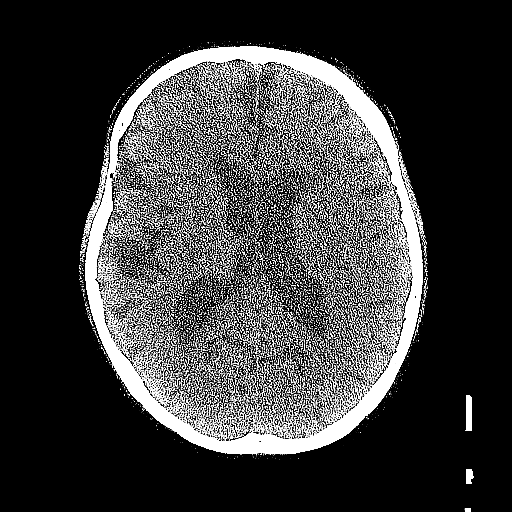
[im 43/79  bone]
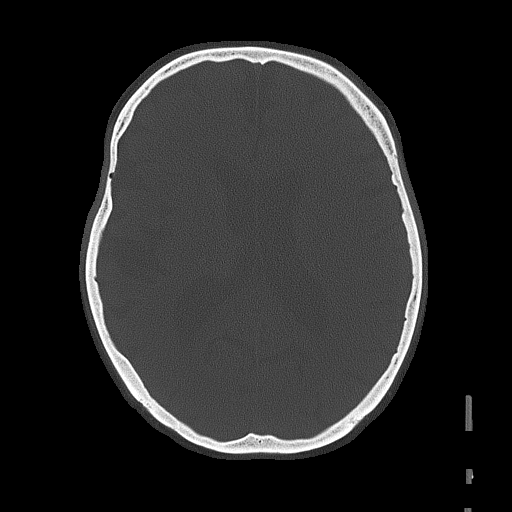
[im 55/79  brain]
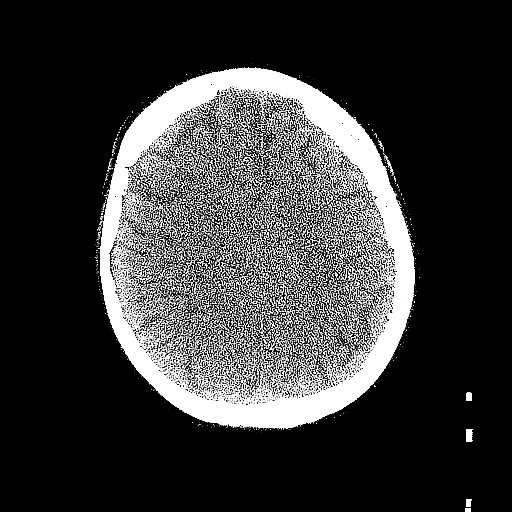
[im 63/79  brain]
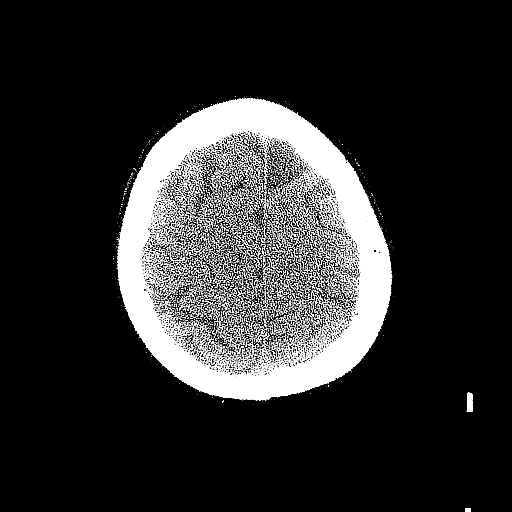
[im 71/79  brain]
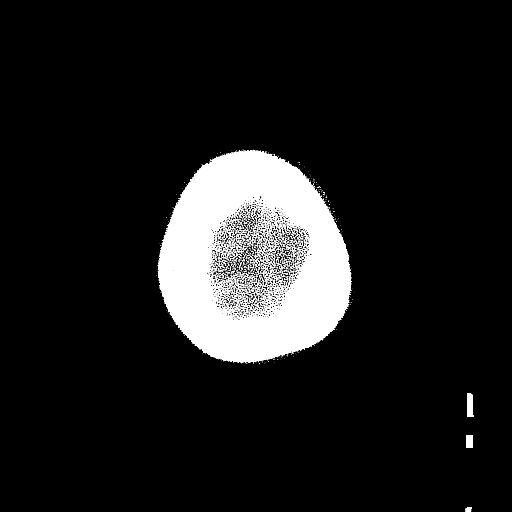

[Series 5: head 3.0 mpr cor · coronal · 0.28mm/px · 3 of 67 slices shown]
[im 23/67  brain]
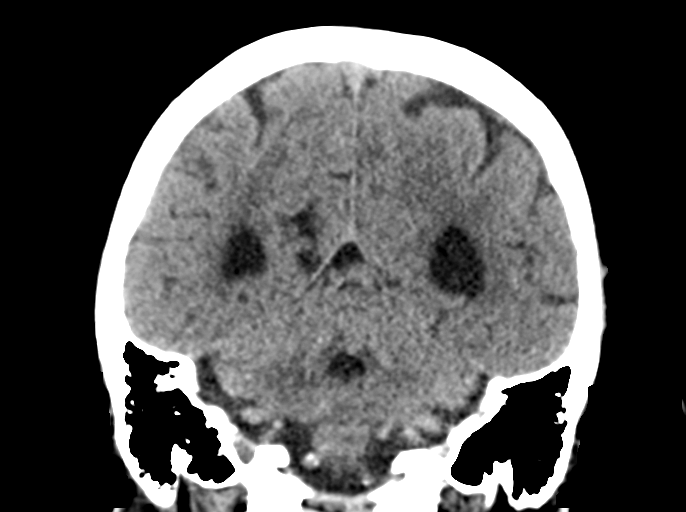
[im 30/67  brain]
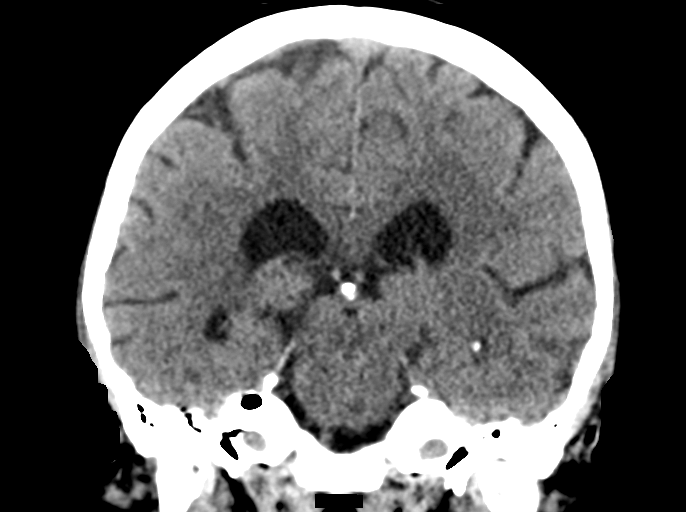
[im 37/67  brain]
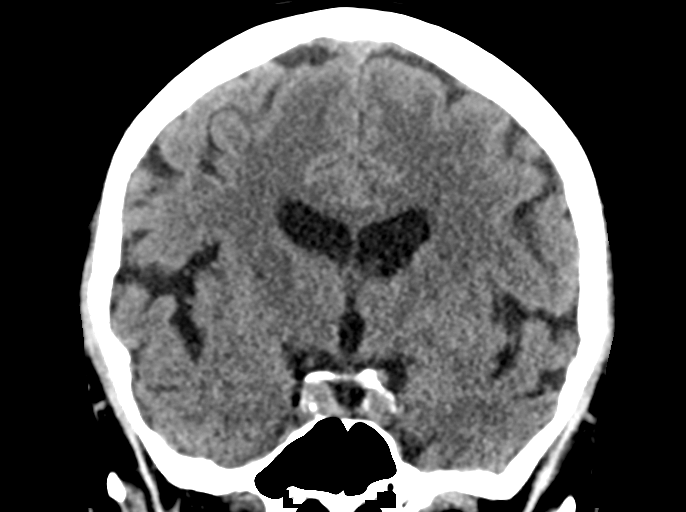

[Series 6: head 3.0 mpr sag · sagittal · 0.31mm/px · 3 of 67 slices shown]
[im 23/67  brain]
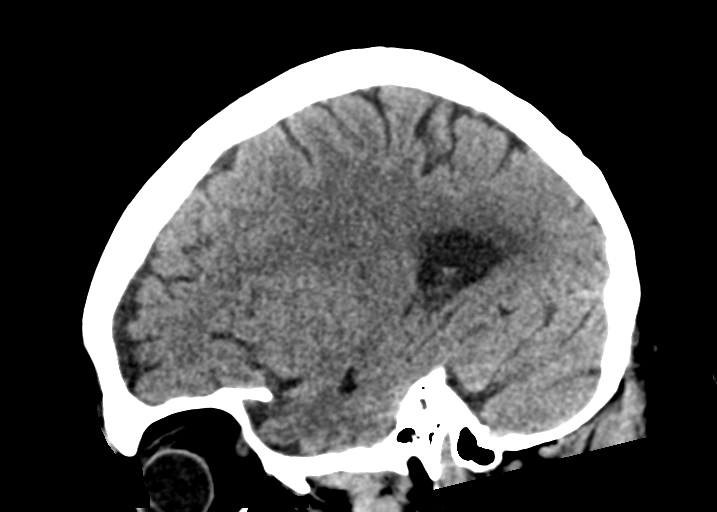
[im 34/67  brain]
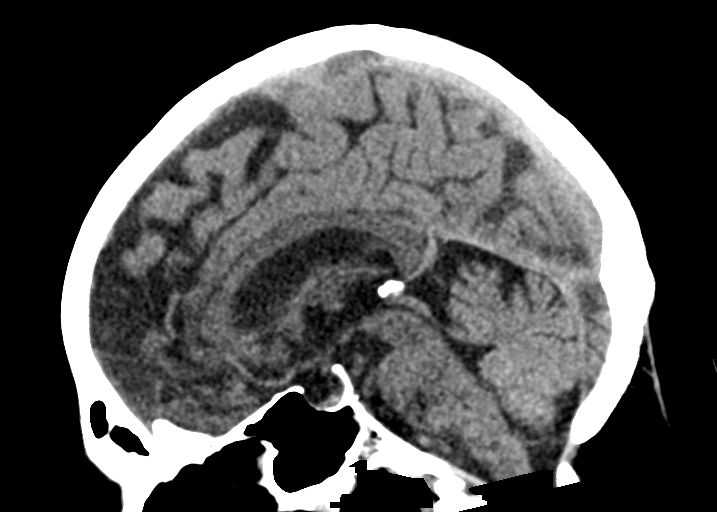
[im 45/67  brain]
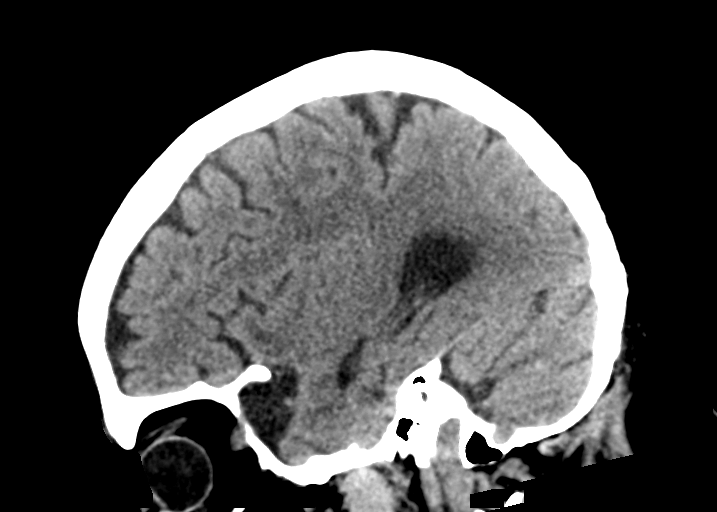

[14 of 47 positions shown; findings below may reference images not displayed]

FINDINGS: Brain: Acute 1.4 by 0.7 by 1.0 cm (volume = 0.5 cm^3) right
thalamic hematoma with surrounding vasogenic edema. No
intraventricular hemorrhage or other abnormal intracranial blood
products are identified.

Periventricular white matter and corona radiata hypodensities favor
chronic ischemic microvascular white matter disease.

The brainstem, cerebellum, and basal ganglia appear unremarkable.

No mass lesion or acute CVA is identified.

Vascular: There is atherosclerotic calcification of the cavernous
carotid arteries bilaterally.

Skull: Unremarkable

Sinuses/Orbits: Chronic bilateral maxillary and ethmoid sinusitis
with mild chronic right frontal and left sphenoid sinusitis.

Other: No supplemental non-categorized findings.
IMPRESSION: 1. Acute 0.5 cc right thalamic hematoma with surrounding vasogenic
edema.
2. Periventricular white matter and corona radiata hypodensities
favor chronic ischemic microvascular white matter disease.
3. Chronic paranasal sinusitis.
4. Atherosclerosis.

Critical Value/emergent results were called by telephone at the time
of interpretation on [DATE] at [DATE] to provider Dr. LORIKLORENT,
who verbally acknowledged these results.

## 2021-09-19 IMAGING — MR MR HEAD W/O CM
10 of 11 series · 43 of 48 positions shown · non-contrast
Comparison: Prior CT from [DATE].

CLINICAL DATA: Initial evaluation for acute dizziness, acute
intracranial hemorrhage.

EXAM:
MRI HEAD WITHOUT CONTRAST
TECHNIQUE: Multiplanar, multiecho pulse sequences of the brain and surrounding
structures were obtained without intravenous contrast.

[Series 5: DWI · axial · 3.0mm · 0.88mm/px · z∈[-77,+70]mm · 10 of 100 slices shown (1 of 4)]
[im 1/100]
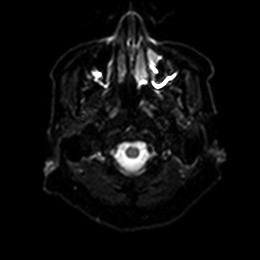
[im 12/100]
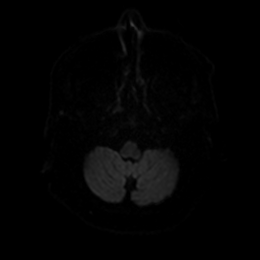
[im 23/100]
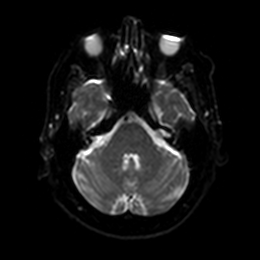
[im 34/100]
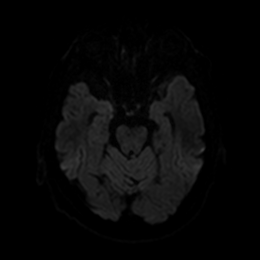
[im 45/100]
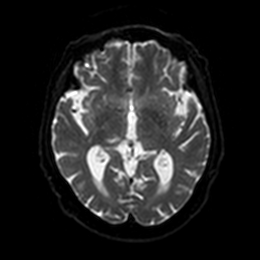
[im 56/100]
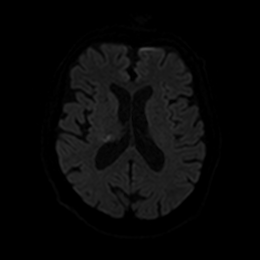
[im 67/100]
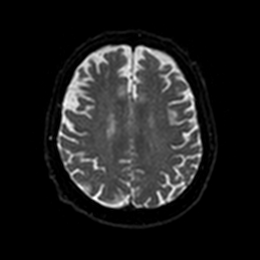
[im 78/100]
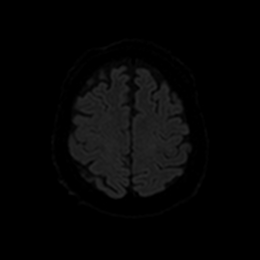
[im 89/100]
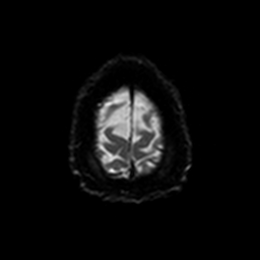
[im 100/100]
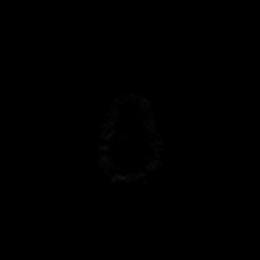

[Series 6: DWI · axial · 3.0mm · 0.88mm/px · z∈[-77,+70]mm · 5 of 50 slices shown (2 of 4)]
[im 1/50]
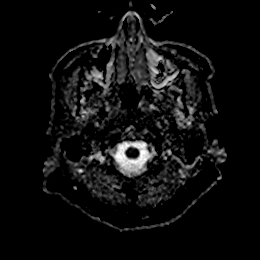
[im 13/50]
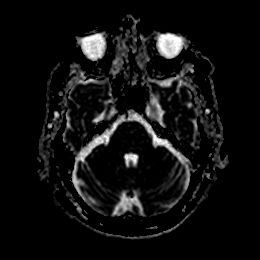
[im 25/50]
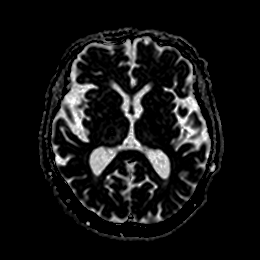
[im 37/50]
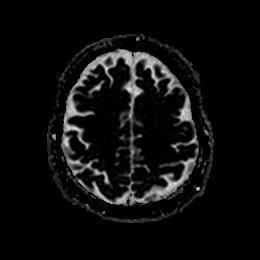
[im 50/50]
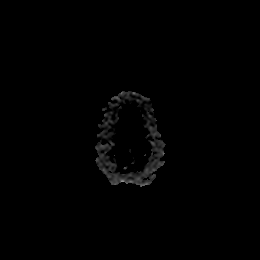

[Series 7: DWI · coronal · 4.0mm · 0.88mm/px · 6 of 64 slices shown (3 of 4)]
[im 1/64]
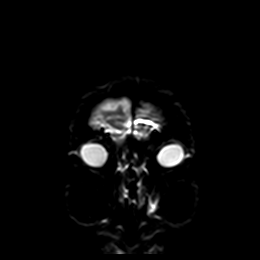
[im 13/64]
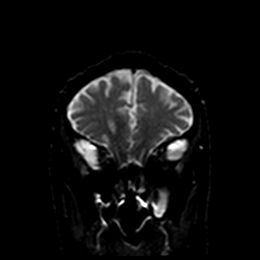
[im 26/64]
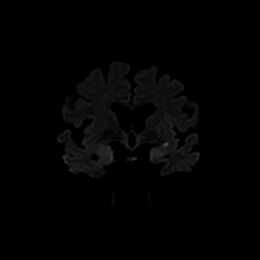
[im 38/64]
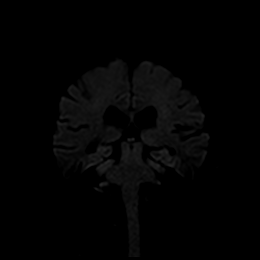
[im 51/64]
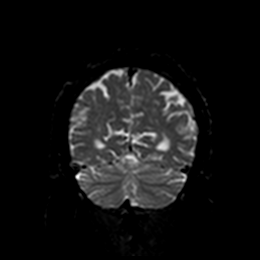
[im 64/64]
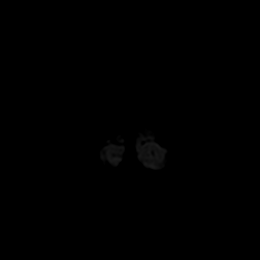

[Series 8: DWI · coronal · 4.0mm · 0.88mm/px · 3 of 32 slices shown (4 of 4)]
[im 1/32]
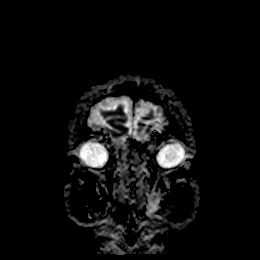
[im 16/32]
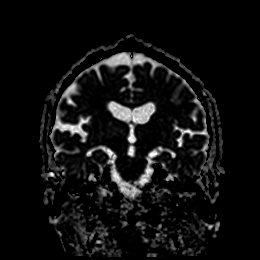
[im 32/32]
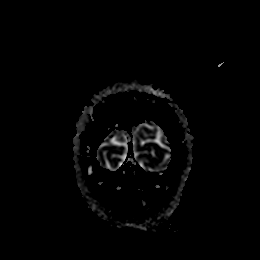

[Series 9: T1 · sagittal · 5.0mm · 0.75mm/px · 2 of 23 slices shown]
[im 1/23]
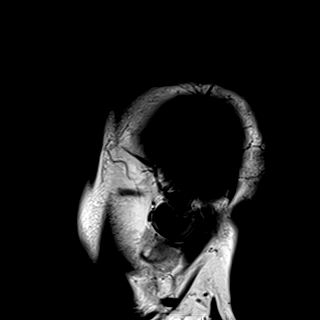
[im 23/23]
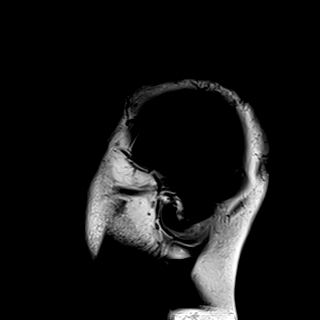

[Series 10: T2 · axial · 5.0mm · 0.72mm/px · z∈[-73,+70]mm · 2 of 25 slices shown (1 of 2)]
[im 1/25]
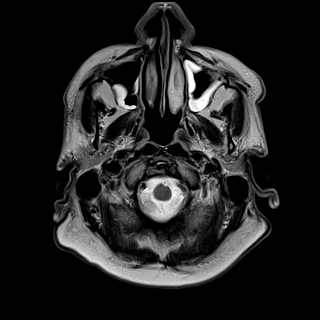
[im 25/25]
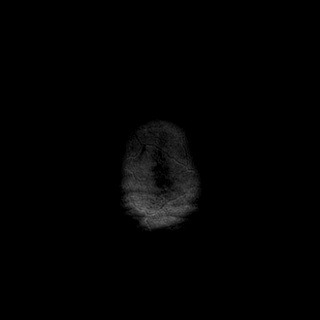

[Series 11: FLAIR · axial · 5.0mm · 0.45mm/px · z∈[-74,+69]mm · 2 of 25 slices shown]
[im 1/25]
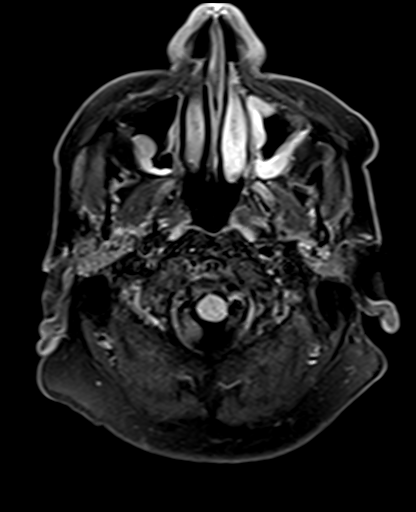
[im 25/25]
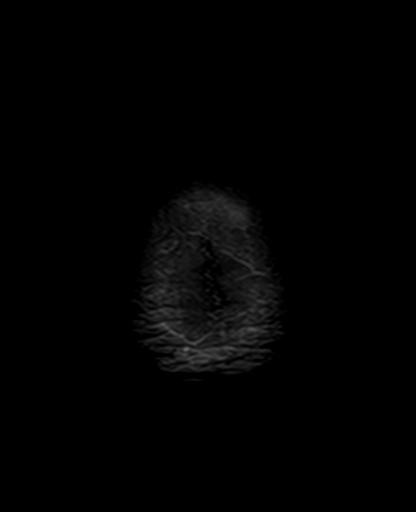

[Series 14: pha_images · axial · 3.0mm · 0.90mm/px · z∈[-93,+72]mm · 5 of 56 slices shown]
[im 1/56]
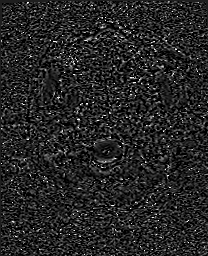
[im 14/56]
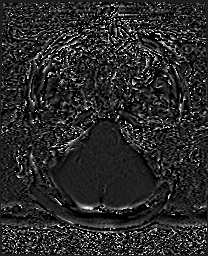
[im 28/56]
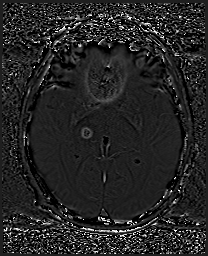
[im 42/56]
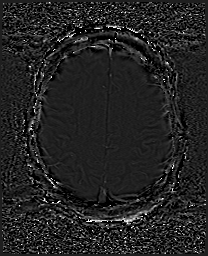
[im 56/56]
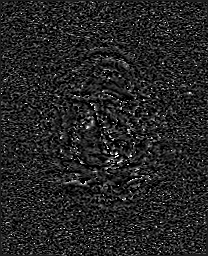

[Series 15: swi_images · axial · 3.0mm · 0.90mm/px · z∈[-93,+84]mm · 5 of 60 slices shown]
[im 1/60]
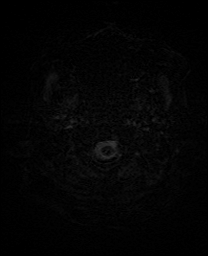
[im 15/60]
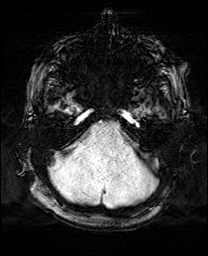
[im 30/60]
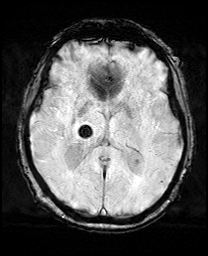
[im 45/60]
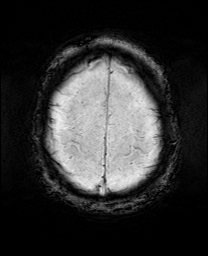
[im 60/60]
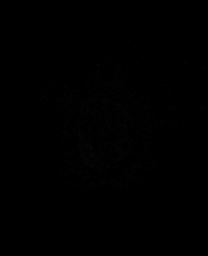

[Series 17: T2 · coronal · 5.0mm · 0.34mm/px · 3 of 29 slices shown (2 of 2)]
[im 1/29]
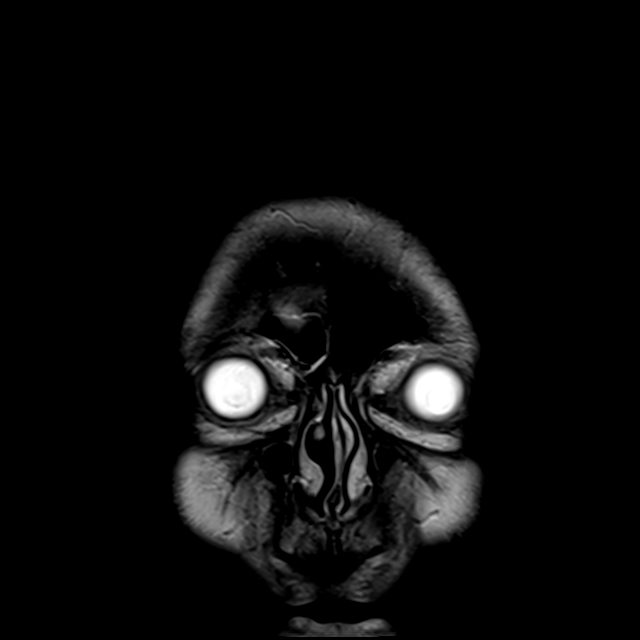
[im 15/29]
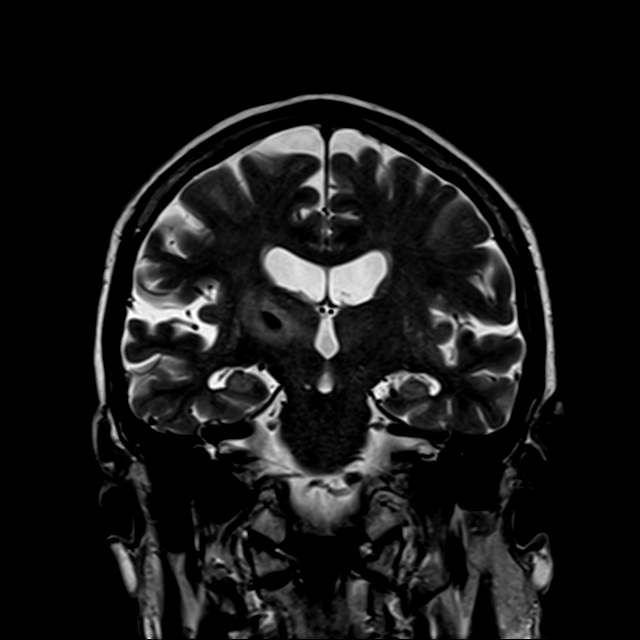
[im 29/29]
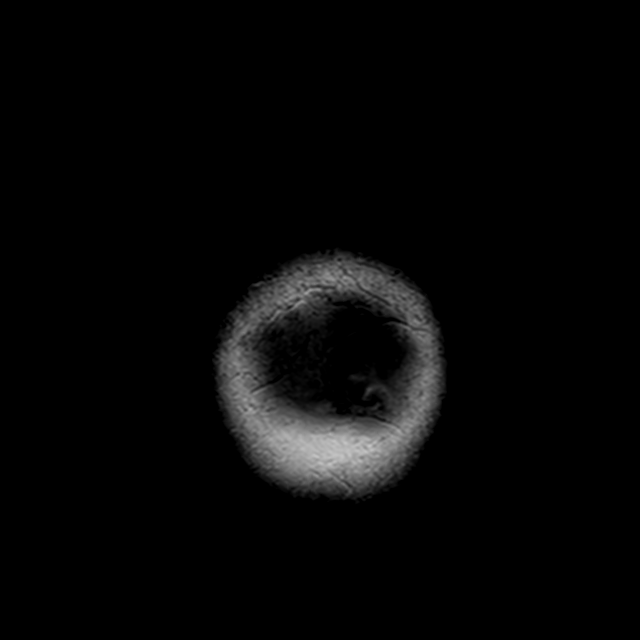

[43 of 48 positions shown; findings below may reference images not displayed]

FINDINGS: Brain: Cerebral volume within normal limits for age. Patchy T2/FLAIR
hyperintensity involving the periventricular deep white matter both
cerebral hemispheres most consistent with chronic small vessel
ischemic disease, mild in nature. Few tiny remote bilateral
cerebellar infarcts noted.

Approximate 1 cm acute intraparenchymal hemorrhage centered at the
right thalamus again seen, relatively stable from prior CT (series
10, image 13). Mild localized edema without significant regional
mass effect. No intraventricular extension or other complication. No
visible underlying mass or other structural abnormality on this
noncontrast examination.

Few additional punctate foci of restricted diffusion seen involving
the mesial left temporal lobe and left basal ganglia (series 5,
images 70, 72, 74), consistent with acute ischemic infarcts. Largest
of these foci measures 5 mm. No associated hemorrhage or mass
effect.

No other foci of restricted diffusion to suggest acute or subacute
ischemia. Gray-white matter differentiation otherwise maintained. No
other areas of chronic cortical infarction. No other evidence for
acute or chronic intracranial hemorrhage.

No mass lesion, midline shift, or significant mass effect. No
hydrocephalus or extra-axial fluid collection. Note made of a
partially empty sella. Midline structures intact.

Vascular: Major intracranial vascular flow voids are maintained.

Skull and upper cervical spine: Craniocervical junction within
normal limits. Bone marrow signal intensity normal. No scalp soft
tissue abnormality.

Sinuses/Orbits: Patient status post bilateral ocular lens
replacement. Globes and orbital soft tissues demonstrate no acute
finding. Scattered mucosal thickening noted throughout the ethmoidal
air cells and maxillary sinuses. No significant mastoid effusion.
Inner ear structures grossly normal.

Other: None.
IMPRESSION: 1. Approximate 1 cm acute intraparenchymal hemorrhage centered at
the right thalamus, not significantly changed from prior CT.
Associated mild localized edema without significant regional mass
effect or midline shift. No intraventricular extension or other
complication.
2. Few additional punctate acute ischemic infarcts involving the
mesial left temporal lobe and left basal ganglia.
3. Underlying mild chronic microvascular ischemic disease.

## 2021-09-19 MED ORDER — EMPTY CONTAINERS FLEXIBLE MISC
900.0000 mg | Freq: Once | Status: AC
  Administered 2021-09-19: 900 mg via INTRAVENOUS
  Filled 2021-09-19: qty 90

## 2021-09-19 MED ORDER — ACETAMINOPHEN 650 MG RE SUPP: 650.0000 mg | RECTAL | Status: DC | PRN

## 2021-09-19 MED ORDER — SODIUM CHLORIDE 0.9% FLUSH
3.0000 mL | Freq: Once | INTRAVENOUS | Status: AC
Start: 2021-09-19 — End: 2021-09-19
  Administered 2021-09-19: 18:00:00 3 mL via INTRAVENOUS

## 2021-09-19 MED ORDER — STROKE: EARLY STAGES OF RECOVERY BOOK
Freq: Once | Status: AC
  Filled 2021-09-19: qty 1

## 2021-09-19 MED ORDER — ACETAMINOPHEN 325 MG PO TABS: 650.0000 mg | ORAL_TABLET | ORAL | Status: DC | PRN

## 2021-09-19 MED ORDER — PANTOPRAZOLE SODIUM 40 MG IV SOLR
40.0000 mg | Freq: Every day | INTRAVENOUS | Status: DC
  Administered 2021-09-20: 40 mg via INTRAVENOUS
  Filled 2021-09-19: qty 40

## 2021-09-19 MED ORDER — CLEVIDIPINE BUTYRATE 0.5 MG/ML IV EMUL
0.0000 mg/h | INTRAVENOUS | Status: DC
  Administered 2021-09-19 – 2021-09-20 (×3): 2 mg/h via INTRAVENOUS
  Filled 2021-09-19 (×2): qty 50

## 2021-09-19 MED ORDER — LABETALOL HCL 5 MG/ML IV SOLN
20.0000 mg | Freq: Once | INTRAVENOUS | Status: AC
  Administered 2021-09-19: 20:00:00 20 mg via INTRAVENOUS
  Filled 2021-09-19: qty 4

## 2021-09-19 MED ORDER — ACETAMINOPHEN 160 MG/5ML PO SOLN: 650.0000 mg | ORAL | Status: DC | PRN

## 2021-09-19 MED ORDER — MECLIZINE HCL 25 MG PO TABS
25.0000 mg | ORAL_TABLET | Freq: Once | ORAL | Status: AC
  Administered 2021-09-19: 18:00:00 25 mg via ORAL
  Filled 2021-09-19: qty 1

## 2021-09-19 MED ORDER — SENNOSIDES-DOCUSATE SODIUM 8.6-50 MG PO TABS
1.0000 | ORAL_TABLET | Freq: Two times a day (BID) | ORAL | Status: DC
  Administered 2021-09-20 (×2): 1 via ORAL
  Filled 2021-09-19 (×3): qty 1

## 2021-09-19 MED ORDER — EMPTY CONTAINERS FLEXIBLE MISC: 1800.0000 mg | Freq: Once | Status: DC

## 2021-09-19 NOTE — ED Notes (Signed)
Wife Otila Kluver [phone] would like an update

## 2021-09-19 NOTE — Telephone Encounter (Signed)
Patient states he had another TIA on 12/28.

## 2021-09-19 NOTE — ED Notes (Signed)
Pt is in MRI  

## 2021-09-19 NOTE — ED Triage Notes (Signed)
Pt arrives via POV from home with episode of dizziness while walking in his home 3 nights ago, also began having left leg numbness, more than baseline, left hand fingers went numb as well. Pt reports since then has felt off balance when walking. Pt with hx of stroke. Pt taking eliquis. VSS.

## 2021-09-19 NOTE — Consult Note (Signed)
Neurology Consult H&P  Tony Roberts MR# [id number] 09/19/2021   CC: hemorrhagic stroke  History is obtained from: patient and chart.  HPI: Tony Roberts is a 80 y.o. male PMHx as reviewed below, previous spinal surgery with residual left leg numbness, atrial fibrillation developed numbness 3 days ago worse than his baseline this was followed by numbness of his left hand  LKW: 3 days ago tNK given: Not a candidate IR Thrombectomy Not a candidate Modified Rankin Scale: 0-Completely asymptomatic and back to baseline post- stroke NIHSS: 1  ROS: A complete ROS was performed and is negative except as noted in the HPI.   Past Medical History:  Diagnosis Date   Acquired dilation of ascending aorta and aortic root ([place])    60mm by echo 2022 and 87mm by cardiac MRI 03/2021   Aortic insufficiency    mild by cardiac MRI 03/2021   Arthritis    Asthma    last asthma attack at age 13   Dilated cardiomyopathy ([place])    likely tachy mediated from atrial fibrillation.  LVF normalized on echo 01/2021   GERD (gastroesophageal reflux disease)    Headache    Mitral regurgitation    mild by cardiac MRI 03/2021   Other specified iron deficiency anemias    PAC (premature atrial contraction)    occasional PAC's   PAF (paroxysmal atrial fibrillation) ([place])    s/p TEE/DCCV 08/2020   TIA (transient ischemic attack)    3 TIAs in last 1 month.  Each episode last about 30 minutes, symptoms was one-sided numbness and facial droop and speech problems. MRI 08/2020 showed A tiny chronic infarct     Family History  Problem Relation Age of Onset   Alzheimer's disease Mother    Asthma Father    Social History:  reports that he has never smoked. He has never used smokeless tobacco. He reports that he does not drink alcohol and does not use drugs.  Prior to Admission medications   Medication Sig Start Date End Date Taking? Authorizing Provider  ADVAIR DISKUS 250-50 MCG/DOSE AEPB INHALE 1 PUFF BY MOUTH ONCE  TO TWICE A DAY TO PREVENT COUGH OR WHEEZE. RINSE, GARGLE, AND SPIT AFTER USE. 09/06/20   Kozlow, Donnamarie Poag, MD  albuterol (PROVENTIL HFA) 108 (90 Base) MCG/ACT inhaler Inhale two puffs every four to six hours as needed for cough or wheeze. 05/19/16   Kozlow, Donnamarie Poag, MD  amiodarone (PACERONE) 200 MG tablet TAKE 1 TABLET (200 MG TOTAL) BY MOUTH DAILY. 11/21/20 11/21/21  Sueanne Margarita, MD  apixaban (ELIQUIS) 5 MG TABS tablet Take 1 tablet (5 mg total) by mouth 2 (two) times daily. 06/05/21   Sueanne Margarita, MD  ciprofloxacin (CILOXAN) 0.3 % ophthalmic solution Place 1-2 drops into affected eye(s) every 2 (two) hours for 7 days. 06/26/21     esomeprazole (NEXIUM) 40 MG capsule Take 1 capsule (40 mg total) by mouth 2 (two) times daily before a meal 07/09/21   Kozlow, Donnamarie Poag, MD  famotidine (PEPCID) 40 MG tablet Take 1 tablet (40 mg total) by mouth at bedtime. 07/01/21   Kozlow, Donnamarie Poag, MD  fluorouracil (EFUDEX) 5 % cream APPLY 1 APPLICATION ON THE SKIN TWICE A DAY FOR 2 WEEKS. Patient not taking: Reported on 07/03/2021 11/25/20 11/25/21  Harriett Sine, MD  Fluticasone Propionate, Inhal, (FLOVENT DISKUS) 250 MCG/BLIST AEPB Inhale 1 puff into the lungs in the morning and at bedtime. Patient not taking: Reported on 07/03/2021 07/01/21  Kozlow, Donnamarie Poag, MD  Fluticasone Propionate, Inhal, (FLOVENT DISKUS) 250 MCG/ACT AEPB Inhale 1 puff into the lungs in the morning and at bedtime. 07/01/21   Kozlow, Donnamarie Poag, MD  Fluticasone-Salmeterol (ADVAIR) 250-50 MCG/DOSE AEPB INHALE 1 PUFF BY MOUTH ONCE TO TWICE A DAY TO PREVENT COUGH OR WHEEZE. RINSE, GARGLE, AND SPIT AFTER USE. 09/06/20 09/06/21  Kozlow, Donnamarie Poag, MD  furosemide (LASIX) 20 MG tablet TAKE 1 TABLET (20 MG TOTAL) BY MOUTH DAILY. Patient not taking: Reported on 07/03/2021 08/28/20 08/28/21  [place] Eric J, DO  levothyroxine (SYNTHROID) 50 MCG tablet Take 1 tablet (50 mcg total) by mouth in the morning on an empty stomach 03/19/21     loratadine (CLARITIN) 10 MG tablet  Take 1 tablet (10 mg total) by mouth 2 (two) times daily as needed for allergies (Can use an extra dose during flare ups). Patient not taking: Reported on 07/03/2021 07/01/21   Jiles Prows, MD  losartan (COZAAR) 25 MG tablet Take 0.5 tablets (12.5 mg total) by mouth daily. 07/03/21   Sueanne Margarita, MD  metoprolol succinate (TOPROL XL) 25 MG 24 hr tablet Take 1 tablet (25 mg total) by mouth daily for 2 days. Patient not taking: Reported on 07/03/2021 07/01/21 07/03/21  Jiles Prows, MD  montelukast (SINGULAIR) 10 MG tablet Take 1 tablet (10 mg total) by mouth at bedtime. 07/01/21 07/01/22  Kozlow, Donnamarie Poag, MD  potassium chloride SA (KLOR-CON) 20 MEQ tablet Take 1 tablet (20 mEq total) by mouth daily. 03/19/21   Sueanne Margarita, MD  triamcinolone (NASACORT) 55 MCG/ACT AERO nasal inhaler Place 2 sprays into the nose daily as needed (for allergies or rhinitis). 07/01/21   Kozlow, Donnamarie Poag, MD  omeprazole (PRILOSEC) 40 MG capsule Take 1 capsule (40 mg total) by mouth 2 (two) times daily. Patient not taking: Reported on 07/03/2021 07/01/21 07/10/21  Jiles Prows, MD   Exam: Current vital signs: BP (!) 163/79    Pulse 67    Temp 98.8 F (37.1 C)    Resp 16    Ht 5\' 6"  (1.676 m)    Wt 79.4 kg    SpO2 100%    BMI 28.25 kg/m   Physical Exam  Constitutional: Appears well-developed and well-nourished.  Psych: Affect appropriate to situation Eyes: No scleral injection HENT: No OP obstruction. Head: Normocephalic.  Cardiovascular: Normal rate and regular rhythm.  Respiratory: Effort normal, symmetric excursions bilaterally, no audible wheezing. GI: Soft.  No distension. There is no tenderness.  Skin: WDI  Neuro: Mental Status: Patient is awake, alert, oriented to person, place, month, year, and situation. Patient is able to give a clear and coherent history. Speech fluent, intact comprehension and repetition. No signs of aphasia or neglect. Visual Fields are full. Pupils are equal, round,  and reactive to light. EOMI without ptosis or diploplia.  Facial sensation is symmetric to temperature Facial movement is symmetric.  Hearing is intact to voice. Uvula midline and palate elevates symmetrically. Shoulder shrug is symmetric. Tongue is midline without atrophy or fasciculations.  Tone is normal. Bulk is normal. 5/5 strength was present in all four extremities. Sensation is decreased to light touch left hand and left lower shin. Deep Tendon Reflexes: 2+ and symmetric in the biceps and patellae. Toes mute on left. FNF and HKS are intact bilaterally. Gait - Deferred  I have reviewed labs in epic and the pertinent results are: No pertinent labs at this time.  I have reviewed the images obtained: NCT  head showed acute 0.5 cc right thalamic hematoma with surrounding vasogenic edema.  Assessment: Tony Roberts is a 80 y.o. male PMHx atrial fibrillation on apixaban with hemorrhagic stroke of right thalamus.    Plan:  - Systolic blood pressure currently is 167 and will admit to neuro ICU overnight in the event he requires a clevidipine drip.  - Monitor neuro exam. - MRI brain without contrast. - MRA head and neck. - Recommend TTE. - Recommend labs: HbA1c, lipid panel. - SBP goal <140/90. - Hold apixaban for now and consider restarting 2-3 days (5-8 days from symptom onset). - Telemetry monitoring for arrhythmia. - Recommend bedside Swallow screen. - Recommend Stroke education. - Recommend PT/OT/SLP consult.  This patient is critically ill and at significant risk of neurological worsening, death and care requires constant monitoring of vital signs, hemodynamics,respiratory and cardiac monitoring, neurological assessment, discussion with family, other specialists and medical decision making of high complexity. I spent 71 minutes of neurocritical care time  in the care of  this patient. This was time spent independent of any time provided by nurse practitioner or  PA.  Electronically signed by:  Lynnae Sandhoff, MD Page: [phone] 09/19/2021, 6:34 PM

## 2021-09-19 NOTE — ED Provider Notes (Signed)
[facility]PART[facility] Provider Note   CSN: [id number] Arrival date & time: 09/19/21  1345     History Chief Complaint  Patient presents with   Dizziness   Numbness    Tony Roberts is a 80 y.o. male hx of A. fib on Eliquis, here presenting with left-sided numbness.  Patient has left arm and leg numbness started 2 days ago.  He was concerned that he may have a mini stroke.  Patient states that his symptoms did not go away but it did improve.  He did call his cardiologist and was sent here for rule out stroke.  Patient took Eliquis this morning.  Patient denies any fall or head injury.  The history is provided by the patient.      Past Medical History:  Diagnosis Date   Acquired dilation of ascending aorta and aortic root ([place])    20mm by echo 2022 and 2mm by cardiac MRI 03/2021   Aortic insufficiency    mild by cardiac MRI 03/2021   Arthritis    Asthma    last asthma attack at age 39   Dilated cardiomyopathy ([place])    likely tachy mediated from atrial fibrillation.  LVF normalized on echo 01/2021   GERD (gastroesophageal reflux disease)    Headache    Mitral regurgitation    mild by cardiac MRI 03/2021   Other specified iron deficiency anemias    PAC (premature atrial contraction)    occasional PAC's   PAF (paroxysmal atrial fibrillation) ([place])    s/p TEE/DCCV 08/2020   TIA (transient ischemic attack)    3 TIAs in last 1 month.  Each episode last about 30 minutes, symptoms was one-sided numbness and facial droop and speech problems. MRI 08/2020 showed A tiny chronic infarct     Patient Active Problem List   Diagnosis Date Noted   Aortic insufficiency 03/24/2021   Mitral regurgitation 03/24/2021   Acquired dilation of ascending aorta and aortic root (HCC)    Asthma, well controlled 12/26/2020   LPRD (laryngopharyngeal reflux disease) 12/26/2020   Other allergic rhinitis 12/26/2020   Dilated cardiomyopathy (HCC)    PAF (paroxysmal atrial  fibrillation) ([place])    Traumatic closed displaced fracture of left shoulder with anterior dislocation    Radiculopathy 10/29/2016   Left fibular fracture 10/27/2013   Sprain and strain of unspecified site of hip and thigh 05/26/2012   Other specified iron deficiency anemias     Past Surgical History:  Procedure Laterality Date   CARDIOVERSION N/A 08/27/2020   Procedure: CARDIOVERSION;  Surgeon: Dorothy Spark, MD;  Location: [facility];  Service: Cardiovascular;  Laterality: N/A;   CATARACT EXTRACTION     HERNIA REPAIR     SHOULDER CLOSED REDUCTION Left 08/23/2020   Procedure: CLOSED REDUCTION SHOULDER;  Surgeon: [name], MD;  Location: [facility];  Service: Orthopedics;  Laterality: Left;   SPINE SURGERY  10/29/2016   TEE WITHOUT CARDIOVERSION N/A 08/27/2020   Procedure: TRANSESOPHAGEAL ECHOCARDIOGRAM (TEE);  Surgeon: Dorothy Spark, MD;  Location: [facility] ENDOSCOPY;  Service: Cardiovascular;  Laterality: N/A;   TONSILLECTOMY         Family History  Problem Relation Age of Onset   Alzheimer's disease Mother    Asthma Father     Social History   Tobacco Use   Smoking status: Never   Smokeless tobacco: Never  Vaping Use   Vaping Use: Never used  Substance Use Topics   Alcohol use: No  Drug use: No    Home Medications Prior to Admission medications   Medication Sig Start Date End Date Taking? Authorizing Provider  ADVAIR DISKUS 250-50 MCG/DOSE AEPB INHALE 1 PUFF BY MOUTH ONCE TO TWICE A DAY TO PREVENT COUGH OR WHEEZE. RINSE, GARGLE, AND SPIT AFTER USE. 09/06/20   Kozlow, Donnamarie Poag, MD  albuterol (PROVENTIL HFA) 108 (90 Base) MCG/ACT inhaler Inhale two puffs every four to six hours as needed for cough or wheeze. 05/19/16   Kozlow, Donnamarie Poag, MD  amiodarone (PACERONE) 200 MG tablet TAKE 1 TABLET (200 MG TOTAL) BY MOUTH DAILY. 11/21/20 11/21/21  Sueanne Margarita, MD  apixaban (ELIQUIS) 5 MG TABS tablet Take 1 tablet (5 mg total) by mouth 2 (two) times daily. 06/05/21   Sueanne Margarita, MD  ciprofloxacin (CILOXAN) 0.3 % ophthalmic solution Place 1-2 drops into affected eye(s) every 2 (two) hours for 7 days. 06/26/21     esomeprazole (NEXIUM) 40 MG capsule Take 1 capsule (40 mg total) by mouth 2 (two) times daily before a meal 07/09/21   Kozlow, Donnamarie Poag, MD  famotidine (PEPCID) 40 MG tablet Take 1 tablet (40 mg total) by mouth at bedtime. 07/01/21   Kozlow, Donnamarie Poag, MD  fluorouracil (EFUDEX) 5 % cream APPLY 1 APPLICATION ON THE SKIN TWICE A DAY FOR 2 WEEKS. Patient not taking: Reported on 07/03/2021 11/25/20 11/25/21  Harriett Sine, MD  Fluticasone Propionate, Inhal, (FLOVENT DISKUS) 250 MCG/BLIST AEPB Inhale 1 puff into the lungs in the morning and at bedtime. Patient not taking: Reported on 07/03/2021 07/01/21   Kozlow, Donnamarie Poag, MD  Fluticasone Propionate, Inhal, (FLOVENT DISKUS) 250 MCG/ACT AEPB Inhale 1 puff into the lungs in the morning and at bedtime. 07/01/21   Kozlow, Donnamarie Poag, MD  Fluticasone-Salmeterol (ADVAIR) 250-50 MCG/DOSE AEPB INHALE 1 PUFF BY MOUTH ONCE TO TWICE A DAY TO PREVENT COUGH OR WHEEZE. RINSE, GARGLE, AND SPIT AFTER USE. 09/06/20 09/06/21  Kozlow, Donnamarie Poag, MD  furosemide (LASIX) 20 MG tablet TAKE 1 TABLET (20 MG TOTAL) BY MOUTH DAILY. Patient not taking: Reported on 07/03/2021 08/28/20 08/28/21  [place] Eric J, DO  levothyroxine (SYNTHROID) 50 MCG tablet Take 1 tablet (50 mcg total) by mouth in the morning on an empty stomach 03/19/21     loratadine (CLARITIN) 10 MG tablet Take 1 tablet (10 mg total) by mouth 2 (two) times daily as needed for allergies (Can use an extra dose during flare ups). Patient not taking: Reported on 07/03/2021 07/01/21   Jiles Prows, MD  losartan (COZAAR) 25 MG tablet Take 0.5 tablets (12.5 mg total) by mouth daily. 07/03/21   Sueanne Margarita, MD  metoprolol succinate (TOPROL XL) 25 MG 24 hr tablet Take 1 tablet (25 mg total) by mouth daily for 2 days. Patient not taking: Reported on 07/03/2021 07/01/21 07/03/21  Jiles Prows, MD   montelukast (SINGULAIR) 10 MG tablet Take 1 tablet (10 mg total) by mouth at bedtime. 07/01/21 07/01/22  Kozlow, Donnamarie Poag, MD  potassium chloride SA (KLOR-CON) 20 MEQ tablet Take 1 tablet (20 mEq total) by mouth daily. 03/19/21   Sueanne Margarita, MD  triamcinolone (NASACORT) 55 MCG/ACT AERO nasal inhaler Place 2 sprays into the nose daily as needed (for allergies or rhinitis). 07/01/21   Kozlow, Donnamarie Poag, MD  omeprazole (PRILOSEC) 40 MG capsule Take 1 capsule (40 mg total) by mouth 2 (two) times daily. Patient not taking: Reported on 07/03/2021 07/01/21 07/10/21  Jiles Prows, MD  Allergies    Lisinopril  Review of Systems   Review of Systems  Neurological:  Positive for dizziness and numbness.  All other systems reviewed and are negative.  Physical Exam Updated Vital Signs BP (!) 178/87    Pulse 65    Temp 98.8 F (37.1 C)    Resp 16    Ht 5\' 6"  (1.676 m)    Wt 79.4 kg    SpO2 100%    BMI 28.25 kg/m   Physical Exam Vitals and nursing note reviewed.  Constitutional:      Appearance: Normal appearance.  HENT:     Head: Normocephalic.     Nose: Nose normal.     Mouth/Throat:     Mouth: Mucous membranes are moist.  Eyes:     Extraocular Movements: Extraocular movements intact.     Pupils: Pupils are equal, round, and reactive to light.  Cardiovascular:     Rate and Rhythm: Normal rate and regular rhythm.     Pulses: Normal pulses.     Heart sounds: Normal heart sounds.  Pulmonary:     Effort: Pulmonary effort is normal.     Breath sounds: Normal breath sounds.  Abdominal:     General: Abdomen is flat.     Palpations: Abdomen is soft.  Musculoskeletal:        General: Normal range of motion.     Cervical back: Normal range of motion and neck supple.  Skin:    General: Skin is warm.  Neurological:     General: No focal deficit present.     Mental Status: He is alert and oriented to person, place, and time.     Comments: Decreased sensation in the left arm and leg,  normal strength throughout  Psychiatric:        Mood and Affect: Mood normal.        Behavior: Behavior normal.    ED Results / Procedures / Treatments   Labs (all labs ordered are listed, but only abnormal results are displayed) Labs Reviewed  PROTIME-INR - Abnormal; Notable for the following components:      Result Value   Prothrombin Time 16.9 (*)    INR 1.4 (*)    All other components within normal limits  APTT - Abnormal; Notable for the following components:   aPTT 38 (*)    All other components within normal limits  CBC - Abnormal; Notable for the following components:   Hemoglobin 12.4 (*)    HCT 37.9 (*)    All other components within normal limits  COMPREHENSIVE METABOLIC PANEL - Abnormal; Notable for the following components:   Calcium 8.4 (*)    Total Protein 6.3 (*)    Albumin 3.3 (*)    GFR, Estimated 60 (*)    All other components within normal limits  I-STAT CHEM 8, ED - Abnormal; Notable for the following components:   Calcium, Ion 1.10 (*)    All other components within normal limits  RESP PANEL BY RT-PCR (FLU A&B, COVID) ARPGX2  DIFFERENTIAL  CBG MONITORING, ED    EKG EKG Interpretation  Date/Time:  Friday September 19 2021 14:07:47 EST Ventricular Rate:  66 PR Interval:  166 QRS Duration: 82 QT Interval:  416 QTC Calculation: 436 R Axis:   -14 Text Interpretation: Normal sinus rhythm Minimal voltage criteria for LVH, may be normal variant ( R in aVL ) Borderline ECG When compared with ECG of 08-Oct-2020 13:35, PREVIOUS ECG IS PRESENT Confirmed by Darl Householder,  Fenton Malling ([id number]) on 09/19/2021 5:21:13 PM  Radiology CT HEAD WO CONTRAST  Result Date: 09/19/2021 CLINICAL DATA:  Dizziness and left leg numbness. EXAM: CT HEAD WITHOUT CONTRAST TECHNIQUE: Contiguous axial images were obtained from the base of the skull through the vertex without intravenous contrast. COMPARISON:  08/23/2020 FINDINGS: Brain: Acute 1.4 by 0.7 by 1.0 cm (volume = 0.5 cm^3) right  thalamic hematoma with surrounding vasogenic edema. No intraventricular hemorrhage or other abnormal intracranial blood products are identified. Periventricular white matter and corona radiata hypodensities favor chronic ischemic microvascular white matter disease. The brainstem, cerebellum, and basal ganglia appear unremarkable. No mass lesion or acute CVA is identified. Vascular: There is atherosclerotic calcification of the cavernous carotid arteries bilaterally. Skull: Unremarkable Sinuses/Orbits: Chronic bilateral maxillary and ethmoid sinusitis with mild chronic right frontal and left sphenoid sinusitis. Other: No supplemental non-categorized findings. IMPRESSION: 1. Acute 0.5 cc right thalamic hematoma with surrounding vasogenic edema. 2. Periventricular white matter and corona radiata hypodensities favor chronic ischemic microvascular white matter disease. 3. Chronic paranasal sinusitis. 4. Atherosclerosis. Critical Value/emergent results were called by telephone at the time of interpretation on 09/19/2021 at 5:03 pm to provider Dr. Regenia Skeeter, who verbally acknowledged these results. Electronically Signed   By: Van Clines M.D.   On: 09/19/2021 17:04    Procedures Procedures   Medications Ordered in ED Medications  meclizine (ANTIVERT) tablet 25 mg (has no administration in time range)  coag fact Xa recombinant (ANDEXXA) low dose infusion 900 mg (has no administration in time range)  sodium chloride flush (NS) 0.9 % injection 3 mL (3 mLs Intravenous Given 09/19/21 1754)    ED Course  I have reviewed the triage vital signs and the nursing notes.  Pertinent labs & imaging results that were available during my care of the patient were reviewed by me and considered in my medical decision making (see chart for details).    MDM Rules/Calculators/A&P                         This patient presents to the ED for concern of left arm numbness, this involves an extensive number of treatment  options, and is a complaint that carries with it a high risk of complications and morbidity.  The differential diagnosis includes ischemic or hemorrhagic stroke or TIA   Co morbidities that complicate the patient evaluation Atrial fibrillation, previous TIA\   Additional history obtained: Additional history obtained External records from outside source obtained and reviewed including outpatient notes    Lab Tests: I Ordered, and personally interpreted labs.  The pertinent results include: INR 1.4.  Normal CBC and BMP   Imaging Studies ordered: I ordered imaging studies including CT head I independently visualized and interpreted imaging which showed right thalamic bleed I agree with the radiologist interpretation   Cardiac Monitoring: The patient was maintained on a cardiac monitor.  I personally viewed and interpreted the cardiac monitored which showed an underlying rhythm of: normal sinus rhythm   Medicines ordered and prescription drug management: I ordered medication including Kcentra for reversal of Eliquis Reevaluation of the patient after these medicines showed that the patient improved I have reviewed the patients home medicines and have made adjustments as needed   Test Considered: CBC and BMP and PT/INR and CT   Critical Interventions: I reversed Eliquis with Kcentra.  I also consulted neurology.  CRITICAL CARE Performed by: Wandra Arthurs   Total critical care time: 30 minutes  Critical care  time was exclusive of separately billable procedures and treating other patients.  Critical care was necessary to treat or prevent imminent or life-threatening deterioration.  Critical care was time spent personally by me on the following activities: development of treatment plan with patient and/or surrogate as well as nursing, discussions with consultants, evaluation of patient's response to treatment, examination of patient, obtaining history from patient or surrogate,  ordering and performing treatments and interventions, ordering and review of laboratory studies, ordering and review of radiographic studies, pulse oximetry and re-evaluation of patient's condition.    Consultations Obtained: I requested consultation with neurology,  and discussed lab and imaging findings as well as pertinent plan - they recommend: Keep blood pressure between 140-160.  They will see patient as consult    Problem List / ED Course: intra cranial hemorrhage    Reevaluation: After the interventions noted above, I reevaluated the patient and found that they have :improved   Dispostion: After consideration of the diagnostic results and the patients response to treatment, I feel that the patent would benefit from admission.  6:59 PM Neurology saw patient and will admit to neuro ICU.      Final Clinical Impression(s) / ED Diagnoses Final diagnoses:  None    Rx / DC Orders ED Discharge Orders     None        Drenda Freeze, MD 09/19/21 1900

## 2021-09-19 NOTE — Telephone Encounter (Signed)
Called pt back s/t his call to triage regarding possible TIA. Pt states that two nights ago (12/28) while getting up from his chair and walking in his house from one room to the next, he felt a sudden numbness in his LLE that lasted approx. 2 min. Pt described this as a numbness, but denied tingling. Condones heavy feeling. Pt denies falling or LOC. Pt condones that today he still has residual numbness in his L hand on the fingers only and slightly in the LLE from knee down. Pt supplied following vitals: 12/28 (after above described event)- 128/68 HR 59         12/29- 128/73 HR 75. Pt states that he is still on his Eliquis and has not missed any doses. Pt denies experiencing any facial droop or speech difficulties during or following event.  Pt states that he doesn't feel this was anything "overly concerning" but wanted to make Dr. Radford Pax aware. This RN advised pt to go to the Emergency Room right away to be scanned (CT) to validate if this was a TIA/stroke event and stressed the importance of getting it done due to being on Eliquis and educated that TIA's often precede a larger event or stroke. Pt states he has had 3 TIA's in past, never went to hospital with any of them. Pt's medical chart reflects history of TIA's with confirmed old infarct. Pt agreed to go into hospital to be evaluated and states that he will have his wife drive him. (Does not wish to use 911 due to feeling "alright now.")   Pt due to be seen here by Dr Radford Pax in April, will make her aware at this time via routing to her inbasket so that covering physicians can review and advise as needed. Judson Roch, RN

## 2021-09-19 NOTE — ED Notes (Signed)
Delay in sending pt to MRI, d/t iv infiltration and having to start BP medications. MRI made aware of the same.

## 2021-09-19 NOTE — ED Provider Notes (Signed)
Emergency Medicine Provider Triage Evaluation Note  Tony Roberts , a 80 y.o. male  was evaluated in triage.  Pt complains of left leg weakness and numbness in addition to left hand numbness on Wednesday.  Symptoms are currently improving.  Does have a history of atrial fibrillation and subsequent TIA a year ago.  States this feels similar.  Review of Systems  Positive:  Negative: See above   Physical Exam  BP (!) 157/86 (BP Location: Right Arm)    Pulse 65    Temp 98.8 F (37.1 C)    Resp 20    Ht 5\' 6"  (1.676 m)    Wt 79.4 kg    SpO2 97%    BMI 28.25 kg/m  Gen:   Awake, no distress   Resp:  Normal effort  MSK:   Moves extremities without difficulty  Other:  Cranial nerves II through XII are intact.  Decreased objective sensation in the left upper and lower extremity.  4/5 strength of the left lower extremity.  Medical Decision Making  Medically screening exam initiated at 2:37 PM.  Appropriate orders placed.  Tony Roberts was informed that the remainder of the evaluation will be completed by another provider, this initial triage assessment does not replace that evaluation, and the importance of remaining in the ED until their evaluation is complete.  Outside window for code stroke. VAN negative. Stroke workup initiated.   [name], PA-C 09/19/21 1440    Regan Lemming, MD 09/19/21 [phone]

## 2021-09-19 NOTE — ED Notes (Signed)
Gave Coag Factor Xa (Andexxa) low dose bolus dose 400 mg over 15 min. Then programmed rate setting: Rate 25 ml/hr VTBI 90 ml Dose 250 mg/hr to infuse over 2.25 hour.

## 2021-09-20 ENCOUNTER — Inpatient Hospital Stay (HOSPITAL_COMMUNITY): Payer: Medicare Other

## 2021-09-20 DIAGNOSIS — I6389 Other cerebral infarction: Secondary | ICD-10-CM | POA: Diagnosis not present

## 2021-09-20 DIAGNOSIS — I618 Other nontraumatic intracerebral hemorrhage: Secondary | ICD-10-CM

## 2021-09-20 LAB — COMPREHENSIVE METABOLIC PANEL
ALT: 11 U/L (ref 0–44)
AST: 15 U/L (ref 15–41)
Albumin: 3 g/dL — ABNORMAL LOW (ref 3.5–5.0)
Alkaline Phosphatase: 74 U/L (ref 38–126)
Anion gap: 10 (ref 5–15)
BUN: 9 mg/dL (ref 8–23)
CO2: 22 mmol/L (ref 22–32)
Calcium: 8.6 mg/dL — ABNORMAL LOW (ref 8.9–10.3)
Chloride: 104 mmol/L (ref 98–111)
Creatinine, Ser: 1.14 mg/dL (ref 0.61–1.24)
GFR, Estimated: >60 mL/min (ref >60)
Glucose, Bld: 94 mg/dL (ref 70–99)
Potassium: 3.8 mmol/L (ref 3.5–5.1)
Sodium: 136 mmol/L (ref 135–145)
Total Bilirubin: 0.8 mg/dL (ref 0.3–1.2)
Total Protein: 5.6 g/dL — ABNORMAL LOW (ref 6.5–8.1)

## 2021-09-20 LAB — ECHOCARDIOGRAM COMPLETE
AR max vel: 2.7 cm2
AV Peak grad: 12.7 mmHg
Ao pk vel: 1.78 m/s
Area-P 1/2: 4.12 cm2
Calc EF: 57.2 %
Height: 66 in
P 1/2 time: 601 msec
S' Lateral: 3.3 cm
Single Plane A2C EF: 57.1 %
Single Plane A4C EF: 57.9 %
Weight: 2800 oz

## 2021-09-20 LAB — CBC
HCT: 36.4 % — ABNORMAL LOW (ref 39.0–52.0)
Hemoglobin: 11.8 g/dL — ABNORMAL LOW (ref 13.0–17.0)
MCH: 28.4 pg (ref 26.0–34.0)
MCHC: 32.4 g/dL (ref 30.0–36.0)
MCV: 87.7 fL (ref 80.0–100.0)
Platelets: 143 10*3/uL — ABNORMAL LOW (ref 150–400)
RBC: 4.15 MIL/uL — ABNORMAL LOW (ref 4.22–5.81)
RDW: 15.1 % (ref 11.5–15.5)
WBC: 4.8 10*3/uL (ref 4.0–10.5)
nRBC: 0 % (ref 0.0–0.2)

## 2021-09-20 LAB — MRSA NEXT GEN BY PCR, NASAL: MRSA by PCR Next Gen: NOT DETECTED

## 2021-09-20 IMAGING — CT CT HEAD W/O CM
4 series · 16 of 47 positions shown, 18 images · non-contrast
Comparison: CT head without contrast [DATE]. MR head without
contrast and CTA head and neck [DATE].

CLINICAL DATA: Neuro deficit, acute, stroke suspected. Follow-up
thalamic hemorrhage.

EXAM:
CT HEAD WITHOUT CONTRAST
TECHNIQUE: Contiguous axial images were obtained from the base of the skull
through the vertex without intravenous contrast.

[Series 3: head without · axial · non-contrast · 0.45mm/px · z∈[-33,+87]mm · 7 of 34 slices shown, 9 images]
[im 5/34  brain]
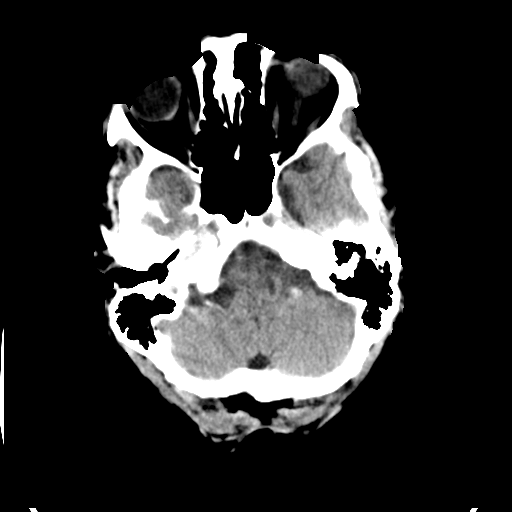
[im 5/34  bone]
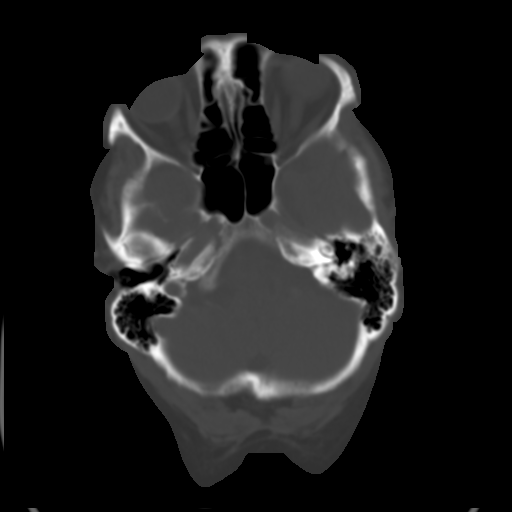
[im 9/34  brain]
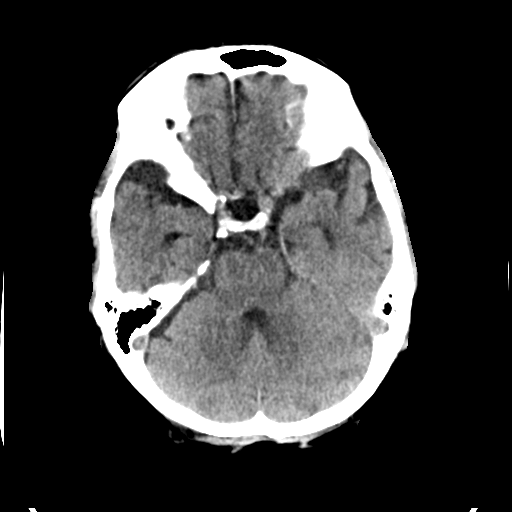
[im 13/34  brain]
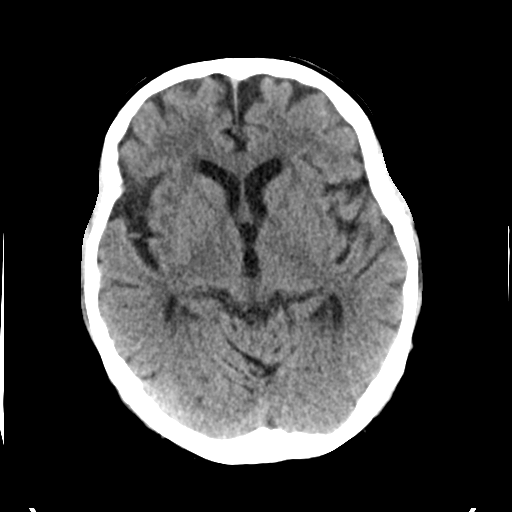
[im 17/34  brain]
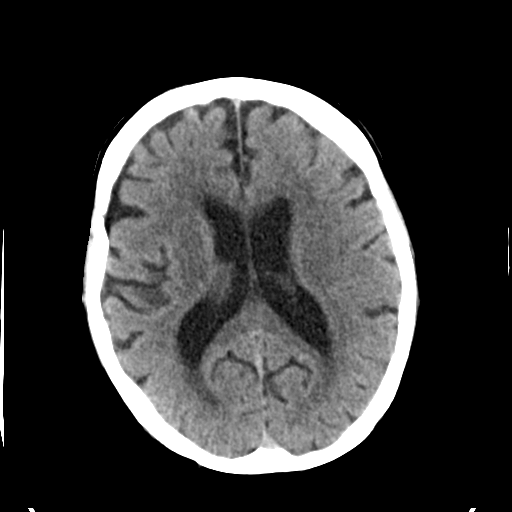
[im 21/34  brain]
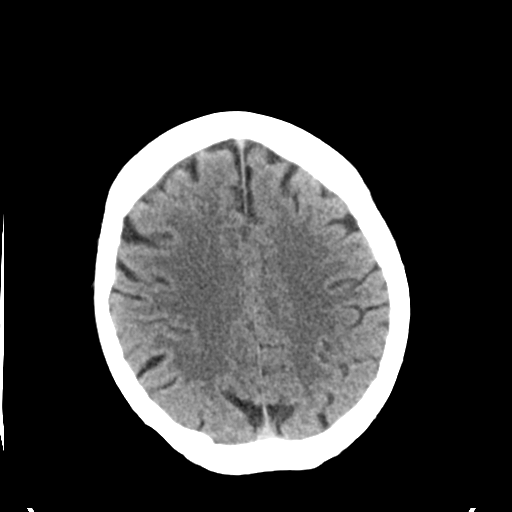
[im 21/34  bone]
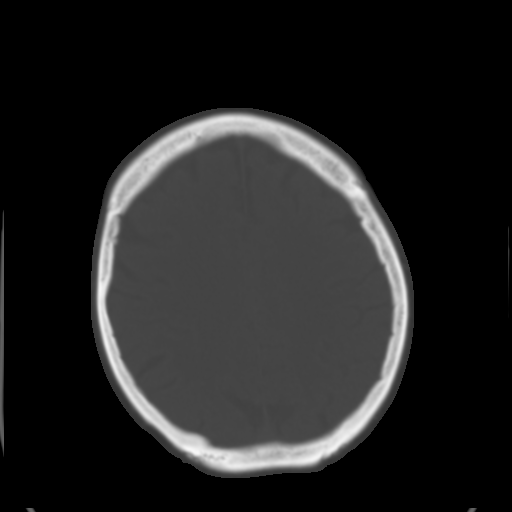
[im 25/34  brain]
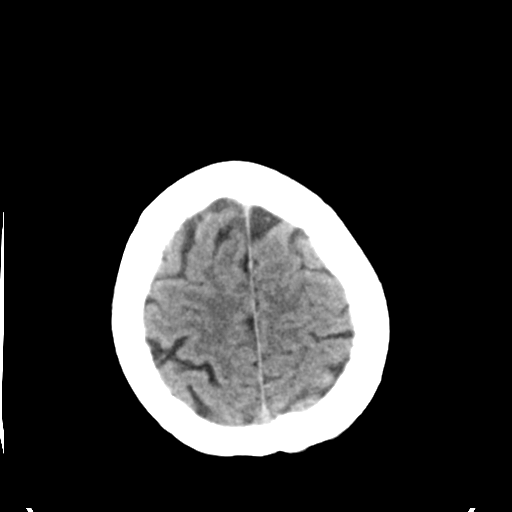
[im 29/34  brain]
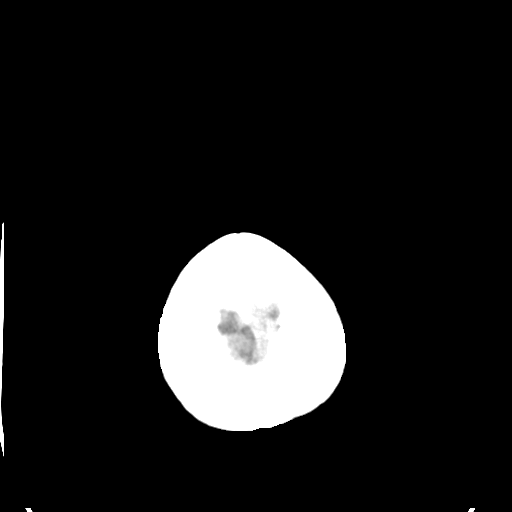

[Series 4: ax head bone · axial · 0.43mm/px · z∈[-27,+5]mm · 3 of 85 slices shown]
[im 9/85  bone]
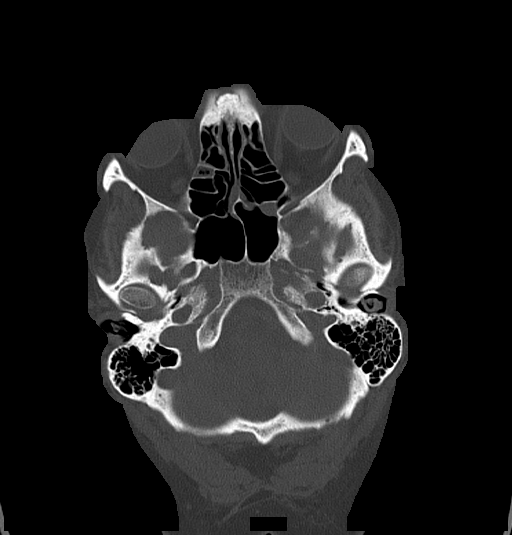
[im 17/85  bone]
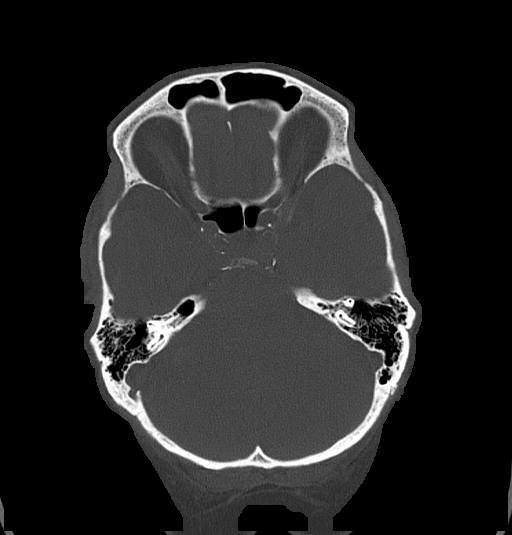
[im 26/85  bone]
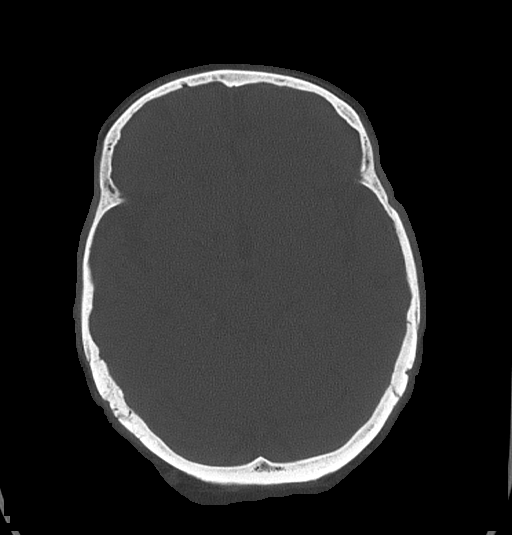

[Series 5: head without cor · coronal · non-contrast · 0.33mm/px · 3 of 63 slices shown]
[im 21/63  brain]
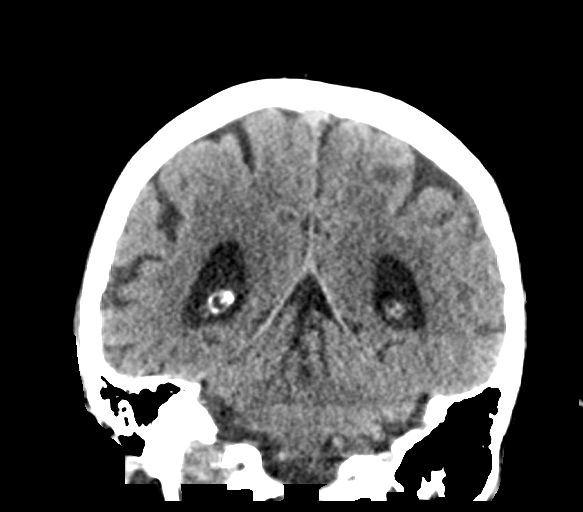
[im 28/63  brain]
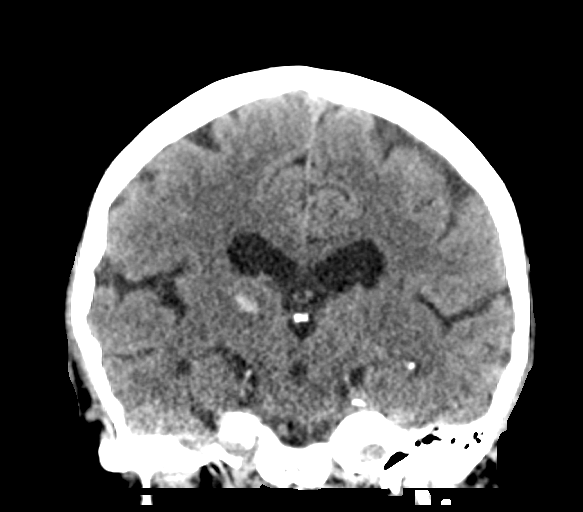
[im 35/63  brain]
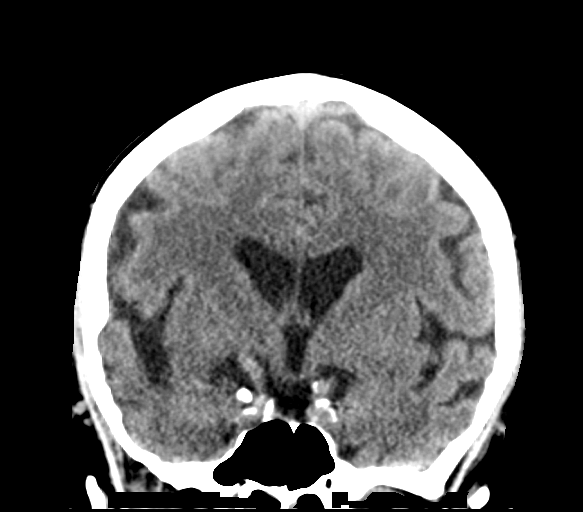

[Series 6: head without sag · sagittal · non-contrast · 0.34mm/px · 3 of 52 slices shown]
[im 18/52  brain]
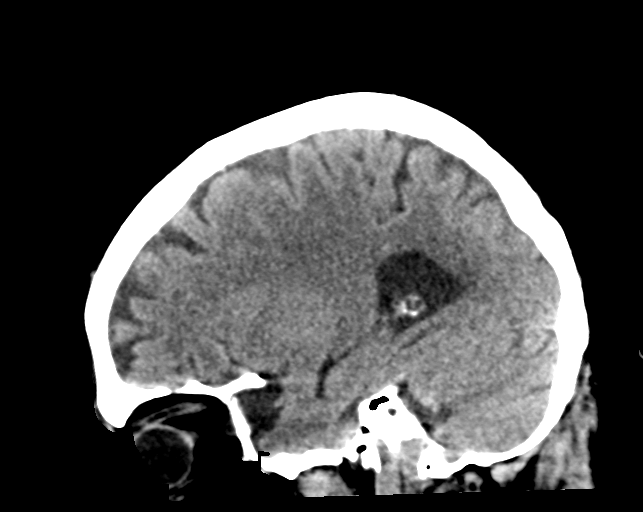
[im 26/52  brain]
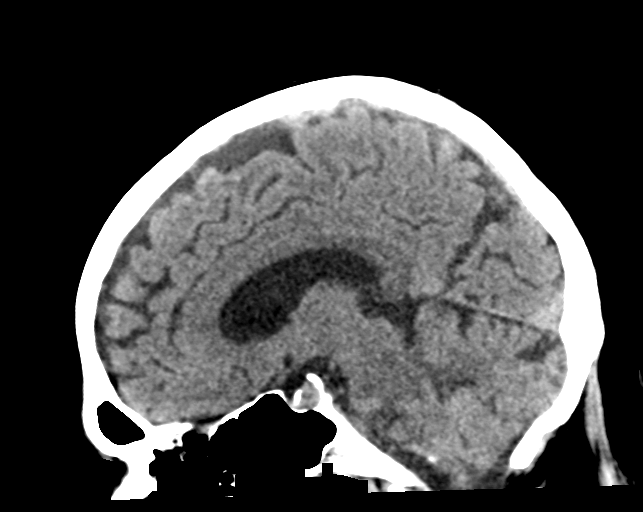
[im 35/52  brain]
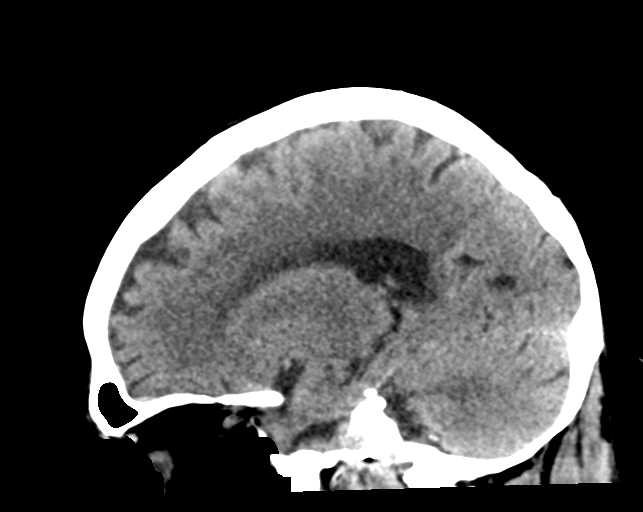

[16 of 47 positions shown; findings below may reference images not displayed]

FINDINGS: Brain: The right thalamic hyperdense hemorrhage is stable, measuring
1.3 x 0.6 x 1.1 cm. Surrounding vasogenic edema is again seen. No
new hemorrhage is present. Basal ganglia are intact. No acute
cortical infarct is present. The brainstem and cerebellum are within
normal limits.

Vascular: Atherosclerotic changes are present within the cavernous
internal carotid arteries and vertebral arteries bilaterally. No
hyperdense vessel is present.

Skull: Calvarium is intact. No focal lytic or blastic lesions are
present. No significant extracranial soft tissue lesion is present.

Sinuses/Orbits: The paranasal sinuses and mastoid air cells are
clear. Bilateral lens replacements are noted. Globes and orbits are
otherwise unremarkable.
IMPRESSION: 1. Stable right thalamic hyperdense hemorrhage with surrounding
vasogenic edema.
2. No new hemorrhage.
3. Atherosclerosis.

## 2021-09-20 IMAGING — CT CT ANGIO HEAD-NECK (W OR W/O PERF)
2 of 7 series · 8 of 33 positions shown · IV contrast (omnipaque)
Comparison: Prior MRI from earlier the same day as well as prior CT
from [DATE].

CLINICAL DATA: Initial evaluation for hemorrhagic stroke.

EXAM:
CT ANGIOGRAPHY HEAD AND NECK
TECHNIQUE: Multidetector CT imaging of the head and neck was performed using
the standard protocol during bolus administration of intravenous
contrast. Multiplanar CT image reconstructions and MIPs were
obtained to evaluate the vascular anatomy. Carotid stenosis
measurements (when applicable) are obtained utilizing NASCET
criteria, using the distal internal carotid diameter as the
denominator.
CONTRAST:  75mL OMNIPAQUE IOHEXOL 350 MG/ML SOLN

[Series 5: cta neck · axial · 0.40mm/px · z∈[-151,-45]mm · 2 of 160 slices shown]
[im 54/160  soft-tissue]
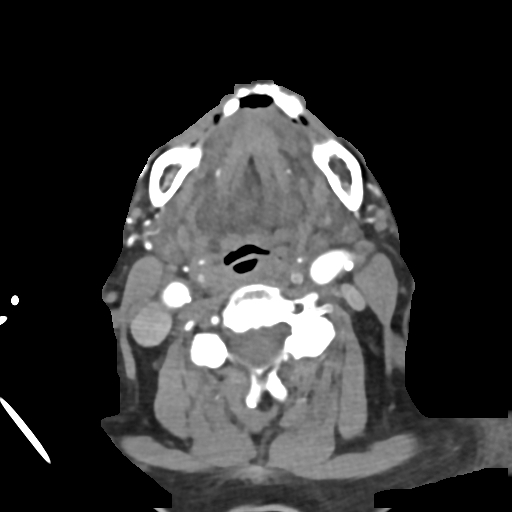
[im 107/160  soft-tissue]
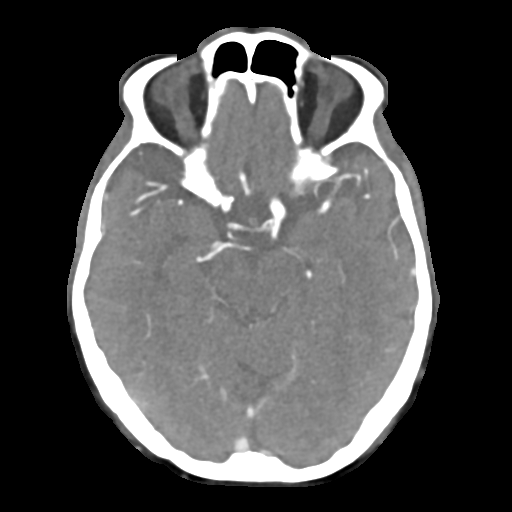

[Series 7: cta neck axial · axial · 0.44mm/px · z∈[-212,+16]mm · 6 of 317 slices shown]
[im 46/317  soft-tissue]
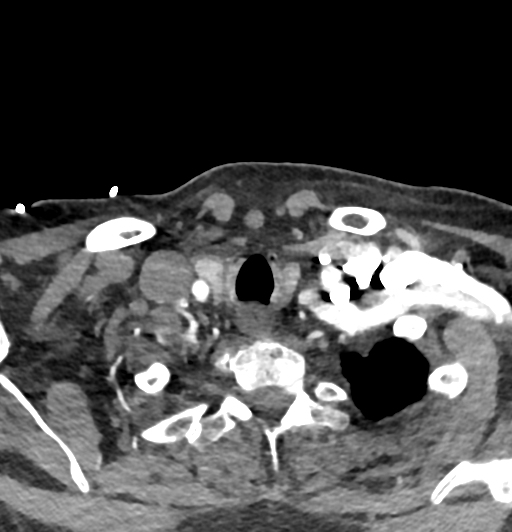
[im 91/317  bone]
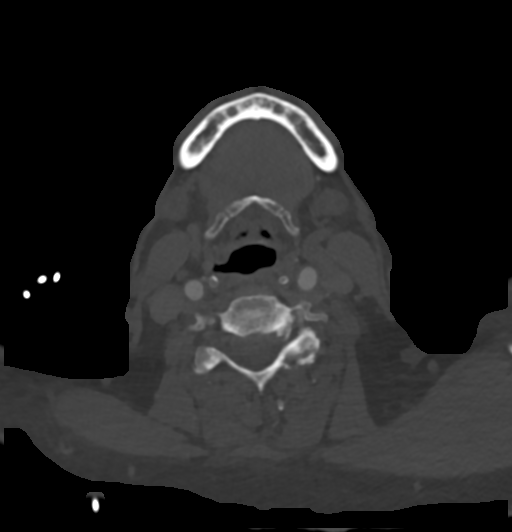
[im 136/317  soft-tissue]
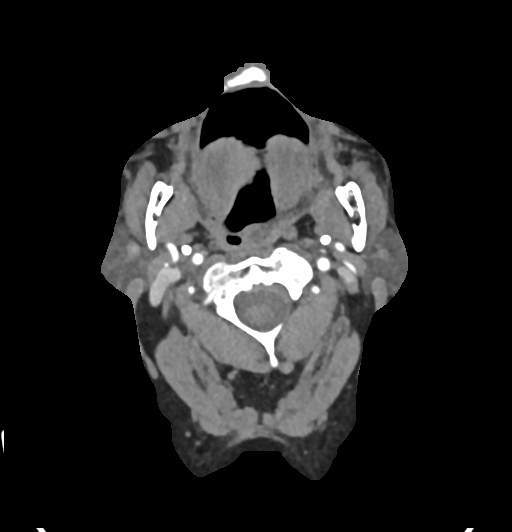
[im 181/317  bone]
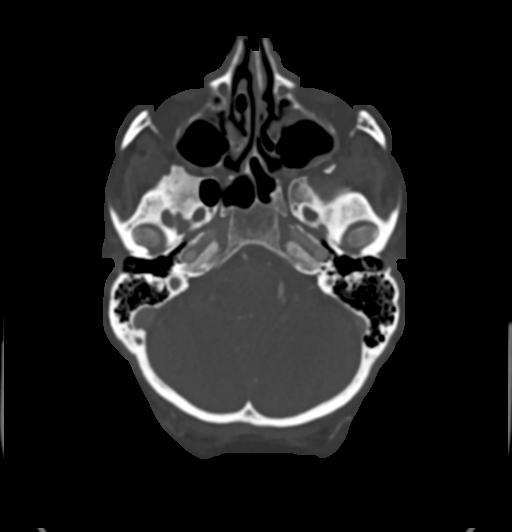
[im 226/317  soft-tissue]
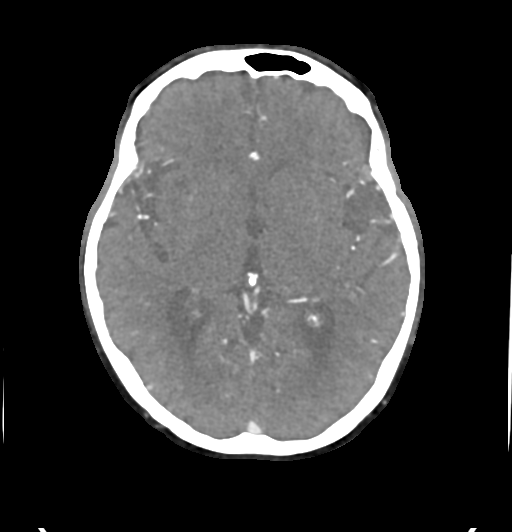
[im 271/317  bone]
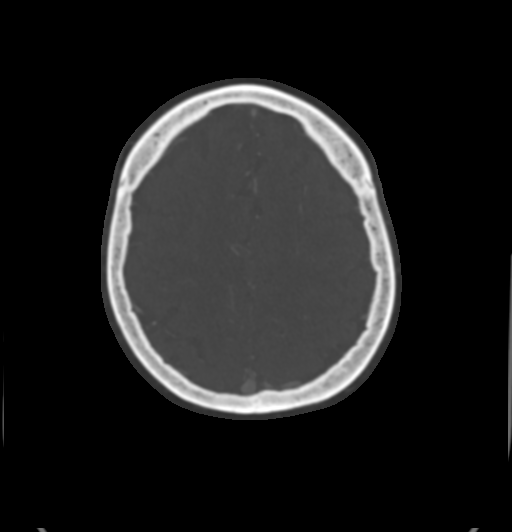

[8 of 33 positions shown; findings below may reference images not displayed]

FINDINGS: CTA NECK FINDINGS

Aortic arch: Visualized aortic arch normal in caliber with normal
branch pattern. Mild atheromatous change about the arch itself. No
hemodynamically significant stenosis seen about the origin the great
vessels.

Right carotid system: Right common and internal carotid arteries
mildly tortuous but are widely patent without stenosis, dissection,
or occlusion. Mild for age atheromatous plaque about the right
carotid bulb without stenosis.

Left carotid system: Left common and internal carotid arteries are
mildly tortuous but widely patent without stenosis, dissection, or
occlusion. Mild for age atheromatous change about the left carotid
bulb without stenosis.

Vertebral arteries: Both vertebral arteries arise from subclavian
arteries. No proximal subclavian artery stenosis. Vertebral arteries
patent without stenosis, dissection or occlusion.

Skeleton: No discrete or worrisome osseous lesions. Congenital
fusion of C2 and C3 noted. Moderate spondylosis at C5-6 and C6-7.
Patient is edentulous.

Other neck: No other acute soft tissue abnormality within the neck.
No mass or adenopathy. Approximate 1.7 cm nodule noted extending
from the inferior right thyroid pole (series 5, image 143),
indeterminate.

Upper chest: Scattered interlobular septal thickening seen within
the visualized lungs, suggesting pulmonary interstitial congestion.
Superimposed scattered atelectatic changes noted. Visualized upper
chest demonstrates no other acute finding.

Review of the MIP images confirms the above findings

CTA HEAD FINDINGS

Anterior circulation: Petrous segments patent bilaterally. Scattered
atheromatous change within the carotid siphons without
hemodynamically significant stenosis. A1 segments patent
bilaterally. Normal anterior communicating artery complex. Anterior
cerebral arteries patent to their distal aspects without stenosis.

Right M1 segment widely patent. Normal right MCA bifurcation. Distal
right MCA branches well perfused.

Left M1 segment widely patent proximally. Focal moderate distal left
M1 stenosis noted (series 10, image 19). Normal left MCA
bifurcation. Distal left MCA branches patent and well perfused.

Posterior circulation: Both vertebral arteries widely patent as it
crosses into the cranial vault. Atheromatous change about the mid V4
segments bilaterally with associated moderate to severe stenoses
(series 7, image 152). Both PICA patent. Basilar diminutive but
patent to its distal aspect without significant stenosis. Right SCA
arises from the basilar and is widely patent. Left SCA supplied via
the left PCA is also widely patent. Fetal type origin of the PCAs
noted bilaterally. Both PCAs well perfused to their distal aspects.

Venous sinuses: Patent allowing for timing the contrast bolus.

Anatomic variants: Fetal type origin of the PCAs with overall
diminutive vertebrobasilar system. No intracranial aneurysm. No
other vascular malformation seen underlying the acute right thalamic
hemorrhage.

Review of the MIP images confirms the above findings
IMPRESSION: 1. Negative CTA for large vessel occlusion. No vascular malformation
seen underlying the acute right thalamic hemorrhage.
2. Intracranial atherosclerotic disease with associated moderate
distal left M1 stenosis, with moderate to severe bilateral V4
stenoses as above.
3. Mild for age atheromatous change elsewhere about the carotid
bifurcations and carotid siphons. No other hemodynamically
significant or correctable stenosis.
4. Fetal type origin of the PCAs with overall diminutive
vertebrobasilar system.
5. Diffuse interlobular septal thickening within the visualized
lungs, suggesting pulmonary interstitial congestion/edema.
6. 1.7 cm right thyroid nodule, indeterminate. Further evaluation
with dedicated thyroid ultrasound recommended. This could be
performed on a nonemergent outpatient basis.
(ref: [HOSPITAL]. [DATE]): 143-50).

## 2021-09-20 MED ORDER — CHLORHEXIDINE GLUCONATE CLOTH 2 % EX PADS
6.0000 | MEDICATED_PAD | Freq: Every day | CUTANEOUS | Status: DC
  Administered 2021-09-20: 6 via TOPICAL

## 2021-09-20 MED ORDER — IOHEXOL 350 MG/ML SOLN
75.0000 mL | Freq: Once | INTRAVENOUS | Status: AC | PRN
  Administered 2021-09-20: 75 mL via INTRAVENOUS

## 2021-09-20 MED ORDER — LOSARTAN POTASSIUM 25 MG PO TABS
12.5000 mg | ORAL_TABLET | Freq: Every day | ORAL | Status: DC
  Administered 2021-09-20 – 2021-09-22 (×3): 12.5 mg via ORAL
  Filled 2021-09-20 (×2): qty 0.5
  Filled 2021-09-20: qty 1

## 2021-09-20 MED ORDER — AMLODIPINE BESYLATE 5 MG PO TABS
5.0000 mg | ORAL_TABLET | Freq: Every day | ORAL | Status: DC
  Administered 2021-09-20 – 2021-09-22 (×3): 5 mg via ORAL
  Filled 2021-09-20 (×3): qty 1

## 2021-09-20 MED ORDER — PANTOPRAZOLE SODIUM 40 MG PO TBEC
40.0000 mg | DELAYED_RELEASE_TABLET | Freq: Every day | ORAL | Status: DC
  Administered 2021-09-20 – 2021-09-21 (×2): 40 mg via ORAL
  Filled 2021-09-20 (×2): qty 1

## 2021-09-20 NOTE — Progress Notes (Signed)
SLP Cancellation Note  Patient Details Name: Tony Roberts MRN: [id number] DOB: 04/17/1941   Cancelled treatment:       Reason Eval/Treat Not Completed: SLP screened, no needs identified, will sign off   Sonia Baller, MA, CCC-SLP Speech Therapy

## 2021-09-20 NOTE — Progress Notes (Signed)
PT Cancellation Note  Patient Details Name: Tony Roberts MRN: [id number] DOB: Oct 20, 1940   Cancelled Treatment:    Reason Eval/Treat Not Completed: Active bedrest order Patient with ICH and bedrest x 24 hours. Will evaluate once activity orders are updated.   [name] A. Gilford Rile PT, DPT Acute Rehabilitation Services Pager [phone] Office [phone]    [name] 09/20/2021, 7:47 AM

## 2021-09-20 NOTE — Evaluation (Signed)
Physical Therapy Evaluation & Discharge Patient Details Name: Tony Roberts MRN: [id number] DOB: Nov 24, 1940 Today's Date: 09/20/2021  History of Present Illness  80 y/o male presented to ED on 12/30 with dizziness, L LE numbness, and unsteadiness.  MRI showed 1 cm acute intraparenchymal hemorrhage at R thalamus and few additional punctate acute ischemic infarcts in L temporal lobe and basal ganglia. PMH: stroke, Afib  Clinical Impression  Patient admitted with above diagnosis. Patient is modI for all mobility with no AD. Patient scored 24/24 on DGI indicative of low fall risk. Educated patient on BEFAST for signs/symptoms of a stroke, patient verbalized understanding. No further skilled PT needs required acutely. No PT follow up recommended at this time.  PT will sign off.       Recommendations for follow up therapy are one component of a multi-disciplinary discharge planning process, led by the attending physician.  Recommendations may be updated based on patient status, additional functional criteria and insurance authorization.  Follow Up Recommendations No PT follow up    Assistance Recommended at Discharge Intermittent Supervision/Assistance  Functional Status Assessment Patient has not had a recent decline in their functional status  Equipment Recommendations  None recommended by PT    Recommendations for Other Services       Precautions / Restrictions Precautions Precautions: None Restrictions Weight Bearing Restrictions: No      Mobility  Bed Mobility               General bed mobility comments: up in recliner    Transfers Overall transfer level: Modified independent Equipment used: None                    Ambulation/Gait Ambulation/Gait assistance: Modified independent (Device/Increase time) Gait Distance (Feet): 250 Feet Assistive device: None Gait Pattern/deviations: WFL(Within Functional Limits)   Gait velocity interpretation: >4.37 ft/sec,  indicative of normal walking speed      Stairs Stairs: Yes Stairs assistance: Modified independent (Device/Increase time) Stair Management: No rails;Alternating pattern;Forwards Number of Stairs: 10    Wheelchair Mobility    Modified Rankin (Stroke Patients Only)       Balance Overall balance assessment: Modified Independent                               Standardized Balance Assessment Standardized Balance Assessment : Dynamic Gait Index   Dynamic Gait Index Level Surface: Normal Change in Gait Speed: Normal Gait with Horizontal Head Turns: Normal Gait with Vertical Head Turns: Normal Gait and Pivot Turn: Normal Step Over Obstacle: Normal Step Around Obstacles: Normal Steps: Normal Total Score: 24       Pertinent Vitals/Pain Pain Assessment: No/denies pain    Home Living Family/patient expects to be discharged to:: Private residence Living Arrangements: Spouse/significant other Available Help at Discharge: Family;Available 24 hours/day Type of Home: House Home Access: Stairs to enter Entrance Stairs-Rails: Right;Left;Can reach both Entrance Stairs-Number of Steps: 22   Home Layout: One level Home Equipment: None      Prior Function Prior Level of Function : Independent/Modified Independent;Driving                     Hand Dominance   Dominant Hand: Right    Extremity/Trunk Assessment   Upper Extremity Assessment Upper Extremity Assessment: Defer to OT evaluation LUE Deficits / Details: chronic RCT LUE Sensation: WNL LUE Coordination: WNL    Lower Extremity Assessment Lower Extremity Assessment: LLE  deficits/detail LLE Deficits / Details: numbness below knee at baseline    Cervical / Trunk Assessment Cervical / Trunk Assessment: Normal  Communication   Communication: No difficulties  Cognition Arousal/Alertness: Awake/alert Behavior During Therapy: WFL for tasks assessed/performed Overall Cognitive Status: Within  Functional Limits for tasks assessed                                          General Comments      Exercises     Assessment/Plan    PT Assessment Patient does not need any further PT services  PT Problem List         PT Treatment Interventions      PT Goals (Current goals can be found in the Care Plan section)  Acute Rehab PT Goals Patient Stated Goal: to go home PT Goal Formulation: All assessment and education complete, DC therapy Potential to Achieve Goals: Good    Frequency     Barriers to discharge        Co-evaluation   Reason for Co-Treatment: Complexity of the patient's impairments (multi-system involvement);To address functional/ADL transfers   OT goals addressed during session: ADL's and self-care       AM-PAC PT "6 Clicks" Mobility  Outcome Measure Help needed turning from your back to your side while in a flat bed without using bedrails?: None Help needed moving from lying on your back to sitting on the side of a flat bed without using bedrails?: None Help needed moving to and from a bed to a chair (including a wheelchair)?: None Help needed standing up from a chair using your arms (e.g., wheelchair or bedside chair)?: None Help needed to walk in hospital room?: None Help needed climbing 3-5 steps with a railing? : None 6 Click Score: 24    End of Session   Activity Tolerance: Patient tolerated treatment well Patient left: in chair;with call bell/phone within reach;with chair alarm set Nurse Communication: Mobility status PT Visit Diagnosis: Unsteadiness on feet (R26.81)    Time: 2446-2863 PT Time Calculation (min) (ACUTE ONLY): 21 min   Charges:   PT Evaluation $PT Eval Low Complexity: 1 Low          Faiga Stones A. Gilford Rile PT, DPT Acute Rehabilitation Services Pager [phone] Office [phone]   [name] 09/20/2021, 11:23 AM

## 2021-09-20 NOTE — Progress Notes (Signed)
Received pt from ED, alert and oriented, still having numbness on L leg, V/S stable, oriented to the room, call light in reach.

## 2021-09-20 NOTE — Progress Notes (Signed)
°  Transition of Care [facility]) Screening Note   Patient Details  Name: Tony Roberts Date of Birth: 08-22-1941   Transition of Care [facility]) CM/SW Contact:    Alfredia Ferguson, LCSW Phone Number: 09/20/2021, 9:31 AM    Transition of Care Department [facility]) has reviewed patient and no TOC needs have been identified at this time due to active bedrest orders. CSW anticipates there may be a TOC need pending further medical work-up. We will continue to monitor patient advancement through interdisciplinary progression rounds. If new patient transition needs arise, please place a TOC consult.

## 2021-09-20 NOTE — ED Notes (Signed)
Will transport pt to floor after CT complete

## 2021-09-20 NOTE — Evaluation (Signed)
Occupational Therapy Evaluation Patient Details Name: Tony Roberts MRN: [id number] DOB: [birth year] Today's Date: 09/20/2021   History of Present Illness 80 y/o male presented to ED on 12/30 with dizziness, L LE numbness, and unsteadiness.  MRI showed 1 cm acute intraparenchymal hemorrhage at R thalamus and few additional punctate acute ischemic infarcts in L temporal lobe and basal ganglia. PMH: stroke, Afib   Clinical Impression   Patient admitted for the diagnosis above.  PTA he lives on the second floor of a home with his spouse.  He continues to drive, remains active and needed no assist with mobility, ADL/IADL.  Mild balance issues noted, but otherwise he is close to baseline for in room mobility and ADL completion.  No further OT needs in the acute setting.  No post acute OT anticipated.  Recommend home when cleared medically.        Recommendations for follow up therapy are one component of a multi-disciplinary discharge planning process, led by the attending physician.  Recommendations may be updated based on patient status, additional functional criteria and insurance authorization.   Follow Up Recommendations  No OT follow up    Assistance Recommended at Discharge None  Functional Status Assessment  Patient has not had a recent decline in their functional status  Equipment Recommendations  None recommended by OT    Recommendations for Other Services       Precautions / Restrictions Precautions Precautions: Fall Restrictions Weight Bearing Restrictions: No      Mobility Bed Mobility               General bed mobility comments: up in recliner    Transfers Overall transfer level: Modified independent                        Balance Overall balance assessment: Mild deficits observed, not formally tested                                         ADL either performed or assessed with clinical judgement   ADL Overall ADL's : At  baseline                                             Vision Baseline Vision/History: 1 Wears glasses Patient Visual Report: No change from baseline       Perception Perception Perception: Within Functional Limits   Praxis Praxis Praxis: Intact    Pertinent Vitals/Pain Pain Assessment: No/denies pain     Hand Dominance Right   Extremity/Trunk Assessment Upper Extremity Assessment Upper Extremity Assessment: LUE deficits/detail LUE Deficits / Details: chronic RCT LUE Sensation: WNL LUE Coordination: WNL   Lower Extremity Assessment Lower Extremity Assessment: Defer to PT evaluation   Cervical / Trunk Assessment Cervical / Trunk Assessment: Normal   Communication     Cognition Arousal/Alertness: Awake/alert Behavior During Therapy: WFL for tasks assessed/performed Overall Cognitive Status: Within Functional Limits for tasks assessed                                                        Home  Living Family/patient expects to be discharged to:: Private residence Living Arrangements: Spouse/significant other Available Help at Discharge: Family;Available 24 hours/day Type of Home: House Home Access: Stairs to enter [employer] of Steps: 22 Entrance Stairs-Rails: Right;Left;Can reach both Home Layout: One level     Bathroom Shower/Tub: Occupational psychologist: Standard     Home Equipment: None          Prior Functioning/Environment Prior Level of Function : Independent/Modified Independent;Driving                        OT Problem List: Impaired balance (sitting and/or standing)      OT Treatment/Interventions:      OT Goals(Current goals can be found in the care plan section) Acute Rehab OT Goals Patient Stated Goal: Return home OT Goal Formulation: With patient Time For Goal Achievement: 09/26/21 Potential to Achieve Goals: Good  OT Frequency:     Barriers to D/C:  None  noted          Co-evaluation PT/OT/SLP Co-Evaluation/Treatment: Yes Reason for Co-Treatment: Complexity of the patient's impairments (multi-system involvement);To address functional/ADL transfers   OT goals addressed during session: ADL's and self-care      AM-PAC OT "6 Clicks" Daily Activity     Outcome Measure Help from another person eating meals?: None Help from another person taking care of personal grooming?: None Help from another person toileting, which includes using toliet, bedpan, or urinal?: None Help from another person bathing (including washing, rinsing, drying)?: None Help from another person to put on and taking off regular upper body clothing?: None Help from another person to put on and taking off regular lower body clothing?: None 6 Click Score: 24   End of Session Equipment Utilized During Treatment: Gait belt  Activity Tolerance: Patient tolerated treatment well Patient left: in chair;with call bell/phone within reach  OT Visit Diagnosis: Unsteadiness on feet (R26.81)                Time: 4967-5916 OT Time Calculation (min): 25 min Charges:  OT General Charges $OT Visit: 1 Visit OT Evaluation $OT Eval Moderate Complexity: 1 Mod  09/20/2021  RP, OTR/L  Acute Rehabilitation Services  Office:  [phone]   Metta Clines 09/20/2021, 11:16 AM

## 2021-09-20 NOTE — Progress Notes (Addendum)
STROKE TEAM PROGRESS NOTE   INTERVAL HISTORY Patient seen in room, eating breakfast. He is eager to get out of here and get back to the gym and back to bowling. Discussed switching Eliquis to Pradaxa once hemorrhage resolves given eliquis failure. Repeat CT scan around 1200.   Vitals:   09/20/21 0715 09/20/21 0730 09/20/21 0745 09/20/21 0800  BP: (!) 148/74 140/72 (!) 144/75 (!) 142/76  Pulse: 63 (!) 58 (!) 56 (!) 57  Resp: 14 13 13 14   Temp:      TempSrc:      SpO2: 97% 94% 95% 97%  Weight:      Height:       CBC:  Recent Labs  Lab 09/19/21 1432 09/19/21 1449 09/20/21 0337  WBC 5.4  --  4.8  NEUTROABS 4.0  --   --   HGB 12.4* 13.3 11.8*  HCT 37.9* 39.0 36.4*  MCV 89.4  --  87.7  PLT 151  --  867*   Basic Metabolic Panel:  Recent Labs  Lab 09/19/21 1432 09/19/21 1449 09/19/21 1844 09/20/21 0337  NA 136 136  --  136  K 4.2 4.1  --  3.8  CL 103 103  --  104  CO2 22  --   --  22  GLUCOSE 92 88  --  94  BUN 9 12  --  9  CREATININE 1.22 1.10  --  1.14  CALCIUM 8.4*  --   --  8.6*  MG  --   --  2.2  --   PHOS  --   --  3.1  --    Lipid Panel: No results for input(s): CHOL, TRIG, HDL, CHOLHDL, VLDL, LDLCALC in the last 168 hours. HgbA1c:  Recent Labs  Lab 09/19/21 2057  HGBA1C 5.4   Urine Drug Screen:  Recent Labs  Lab 09/19/21 1944  LABOPIA NONE DETECTED  COCAINSCRNUR NONE DETECTED  LABBENZ NONE DETECTED  AMPHETMU NONE DETECTED  THCU NONE DETECTED  LABBARB NONE DETECTED    Alcohol Level  Recent Labs  Lab 09/19/21 2057  ETH <10    IMAGING past 24 hours CT ANGIO HEAD NECK W WO CM  Result Date: 09/20/2021 CLINICAL DATA:  Initial evaluation for hemorrhagic stroke. EXAM: CT ANGIOGRAPHY HEAD AND NECK TECHNIQUE: Multidetector CT imaging of the head and neck was performed using the standard protocol during bolus administration of intravenous contrast. Multiplanar CT image reconstructions and MIPs were obtained to evaluate the vascular anatomy. Carotid  stenosis measurements (when applicable) are obtained utilizing NASCET criteria, using the distal internal carotid diameter as the denominator. CONTRAST:  42mL OMNIPAQUE IOHEXOL 350 MG/ML SOLN COMPARISON:  Prior MRI from earlier the same day as well as prior CT from 09/19/2021. FINDINGS: CTA NECK FINDINGS Aortic arch: Visualized aortic arch normal in caliber with normal branch pattern. Mild atheromatous change about the arch itself. No hemodynamically significant stenosis seen about the origin the great vessels. Right carotid system: Right common and internal carotid arteries mildly tortuous but are widely patent without stenosis, dissection, or occlusion. Mild for age atheromatous plaque about the right carotid bulb without stenosis. Left carotid system: Left common and internal carotid arteries are mildly tortuous but widely patent without stenosis, dissection, or occlusion. Mild for age atheromatous change about the left carotid bulb without stenosis. Vertebral arteries: Both vertebral arteries arise from subclavian arteries. No proximal subclavian artery stenosis. Vertebral arteries patent without stenosis, dissection or occlusion. Skeleton: No discrete or worrisome osseous lesions. Congenital fusion of  C2 and C3 noted. Moderate spondylosis at C5-6 and C6-7. Patient is edentulous. Other neck: No other acute soft tissue abnormality within the neck. No mass or adenopathy. Approximate 1.7 cm nodule noted extending from the inferior right thyroid pole (series 5, image 143), indeterminate. Upper chest: Scattered interlobular septal thickening seen within the visualized lungs, suggesting pulmonary interstitial congestion. Superimposed scattered atelectatic changes noted. Visualized upper chest demonstrates no other acute finding. Review of the MIP images confirms the above findings CTA HEAD FINDINGS Anterior circulation: Petrous segments patent bilaterally. Scattered atheromatous change within the carotid siphons  without hemodynamically significant stenosis. A1 segments patent bilaterally. Normal anterior communicating artery complex. Anterior cerebral arteries patent to their distal aspects without stenosis. Right M1 segment widely patent. Normal right MCA bifurcation. Distal right MCA branches well perfused. Left M1 segment widely patent proximally. Focal moderate distal left M1 stenosis noted (series 10, image 19). Normal left MCA bifurcation. Distal left MCA branches patent and well perfused. Posterior circulation: Both vertebral arteries widely patent as it crosses into the cranial vault. Atheromatous change about the mid V4 segments bilaterally with associated moderate to severe stenoses (series 7, image 152). Both PICA patent. Basilar diminutive but patent to its distal aspect without significant stenosis. Right SCA arises from the basilar and is widely patent. Left SCA supplied via the left PCA is also widely patent. Fetal type origin of the PCAs noted bilaterally. Both PCAs well perfused to their distal aspects. Venous sinuses: Patent allowing for timing the contrast bolus. Anatomic variants: Fetal type origin of the PCAs with overall diminutive vertebrobasilar system. No intracranial aneurysm. No other vascular malformation seen underlying the acute right thalamic hemorrhage. Review of the MIP images confirms the above findings IMPRESSION: 1. Negative CTA for large vessel occlusion. No vascular malformation seen underlying the acute right thalamic hemorrhage. 2. Intracranial atherosclerotic disease with associated moderate distal left M1 stenosis, with moderate to severe bilateral V4 stenoses as above. 3. Mild for age atheromatous change elsewhere about the carotid bifurcations and carotid siphons. No other hemodynamically significant or correctable stenosis. 4. Fetal type origin of the PCAs with overall diminutive vertebrobasilar system. 5. Diffuse interlobular septal thickening within the visualized lungs,  suggesting pulmonary interstitial congestion/edema. 6. 1.7 cm right thyroid nodule, indeterminate. Further evaluation with dedicated thyroid ultrasound recommended. This could be performed on a nonemergent outpatient basis. (ref: J Am Coll Radiol. 2015 Feb;12(2): 143-50). Electronically Signed   By: Jeannine Boga M.D.   On: 09/20/2021 01:52   CT HEAD WO CONTRAST  Result Date: 09/19/2021 CLINICAL DATA:  Dizziness and left leg numbness. EXAM: CT HEAD WITHOUT CONTRAST TECHNIQUE: Contiguous axial images were obtained from the base of the skull through the vertex without intravenous contrast. COMPARISON:  08/23/2020 FINDINGS: Brain: Acute 1.4 by 0.7 by 1.0 cm (volume = 0.5 cm^3) right thalamic hematoma with surrounding vasogenic edema. No intraventricular hemorrhage or other abnormal intracranial blood products are identified. Periventricular white matter and corona radiata hypodensities favor chronic ischemic microvascular white matter disease. The brainstem, cerebellum, and basal ganglia appear unremarkable. No mass lesion or acute CVA is identified. Vascular: There is atherosclerotic calcification of the cavernous carotid arteries bilaterally. Skull: Unremarkable Sinuses/Orbits: Chronic bilateral maxillary and ethmoid sinusitis with mild chronic right frontal and left sphenoid sinusitis. Other: No supplemental non-categorized findings. IMPRESSION: 1. Acute 0.5 cc right thalamic hematoma with surrounding vasogenic edema. 2. Periventricular white matter and corona radiata hypodensities favor chronic ischemic microvascular white matter disease. 3. Chronic paranasal sinusitis. 4. Atherosclerosis. Critical Value/emergent results were  called by telephone at the time of interpretation on 09/19/2021 at 5:03 pm to provider Dr. Regenia Skeeter, who verbally acknowledged these results. Electronically Signed   By: Van Clines M.D.   On: 09/19/2021 17:04   MR BRAIN WO CONTRAST  Result Date: 09/20/2021 CLINICAL  DATA:  Initial evaluation for acute dizziness, acute intracranial hemorrhage. EXAM: MRI HEAD WITHOUT CONTRAST TECHNIQUE: Multiplanar, multiecho pulse sequences of the brain and surrounding structures were obtained without intravenous contrast. COMPARISON:  Prior CT from 09/19/2021. FINDINGS: Brain: Cerebral volume within normal limits for age. Patchy T2/FLAIR hyperintensity involving the periventricular deep white matter both cerebral hemispheres most consistent with chronic small vessel ischemic disease, mild in nature. Few tiny remote bilateral cerebellar infarcts noted. Approximate 1 cm acute intraparenchymal hemorrhage centered at the right thalamus again seen, relatively stable from prior CT (series 10, image 13). Mild localized edema without significant regional mass effect. No intraventricular extension or other complication. No visible underlying mass or other structural abnormality on this noncontrast examination. Few additional punctate foci of restricted diffusion seen involving the mesial left temporal lobe and left basal ganglia (series 5, images 70, 72, 74), consistent with acute ischemic infarcts. Largest of these foci measures 5 mm. No associated hemorrhage or mass effect. No other foci of restricted diffusion to suggest acute or subacute ischemia. Gray-white matter differentiation otherwise maintained. No other areas of chronic cortical infarction. No other evidence for acute or chronic intracranial hemorrhage. No mass lesion, midline shift, or significant mass effect. No hydrocephalus or extra-axial fluid collection. Note made of a partially empty sella. Midline structures intact. Vascular: Major intracranial vascular flow voids are maintained. Skull and upper cervical spine: Craniocervical junction within normal limits. Bone marrow signal intensity normal. No scalp soft tissue abnormality. Sinuses/Orbits: Patient status post bilateral ocular lens replacement. Globes and orbital soft tissues  demonstrate no acute finding. Scattered mucosal thickening noted throughout the ethmoidal air cells and maxillary sinuses. No significant mastoid effusion. Inner ear structures grossly normal. Other: None. IMPRESSION: 1. Approximate 1 cm acute intraparenchymal hemorrhage centered at the right thalamus, not significantly changed from prior CT. Associated mild localized edema without significant regional mass effect or midline shift. No intraventricular extension or other complication. 2. Few additional punctate acute ischemic infarcts involving the mesial left temporal lobe and left basal ganglia. 3. Underlying mild chronic microvascular ischemic disease. Electronically Signed   By: Jeannine Boga M.D.   On: 09/20/2021 00:41    PHYSICAL EXAM  Temp:  [98.1 F (36.7 C)-98.8 F (37.1 C)] 98.1 F (36.7 C) (12/31 0400) Pulse Rate:  [55-85] 57 (12/31 0800) Resp:  [7-23] 14 (12/31 0800) BP: (106-178)/(62-91) 142/76 (12/31 0800) SpO2:  [91 %-100 %] 97 % (12/31 0800) Weight:  [79.4 kg] 79.4 kg (12/30 1421)  General - Well nourished, well developed, in no apparent distress. Ophthalmologic - fundi not visualized due to noncooperation. Cardiovascular - Regular rhythm and rate.  Mental Status -  Level of arousal and orientation to time, place, and person were intact. Language including expression, naming, repetition, comprehension was assessed and found intact. Attention span and concentration were normal. Recent and remote memory were intact. Fund of Knowledge was assessed and was intact.  Cranial Nerves II - XII - II - Visual field intact OU. III, IV, VI - Extraocular movements intact. V - Facial sensation intact bilaterally. VII - Facial movement intact bilaterally. VIII - Hearing & vestibular intact bilaterally. X - Palate elevates symmetrically. XI - Head is midline. Left shoulder weak. XII - Tongue  protrusion intact.  Motor Strength - The patients strength was normal in all  extremities and pronator drift was absent.  Bulk was normal and fasciculations were absent.   Weakness in left shoulder Motor Tone - Muscle tone was assessed at the neck and appendages and was normal. Reflexes - The patients reflexes were symmetrical in all extremities and he had no pathological reflexes. Sensory - Sensation improving in finger tips. Still slightly dull in left leg.  Coordination - The patient had normal movements in the hands and feet with no ataxia or dysmetria.  Tremor was absent. Gait and Station - deferred.   ASSESSMENT/PLAN Mr. ALON MAZOR is a 80 y.o. male with history of afib on eliquis, previous spinal surgery with residual left leg numbness presenting with worsening numbness starting 3 days ago, this was followed by numbness of his left hand. CT showed a right thalamic hematoma with vasogenic edema. He was given Andexxa for Eliquis reversal in the ED. MRI showed the previous hematoma as well as punctate infarcts in the left temporal lobe and left basal ganglia. LDL 137, Ha1C 5.4, repeat CT scheduled for today.   Stroke:  right thalamic IPH likely secondary to Eliquis usage for A. fib s/p Andexxa reversal now showing punctate ischemic infarcts involving the mesial left temporal lobe and left basal ganglia Code Stroke - acute 0.5cc right thalamic hematoma with surrounding vasogenic edema. Periventricular white matter and corona radiata hypodensities. Atherosclerosis Repeat CT pending  CTA head & neck negative for LVO, moderate distal left M1 stenosis, moderate to severe bilateral V4 stenosis, fetal type origin of the PCAs with diminutive vertebrobasilar system MRI  1cm acute IPH at right thalamus, few punctate acute ischemic infarcts involving the mesial left temporal lobe and left basal ganglia 2D Echo LVEF is 55 to 60%.  Left ventricle has normal function, no regional wall abnormalities.  No shunt detected by color-flow Doppler LDL 137 HgbA1c 5.4 VTE prophylaxis -  SCDs Eliquis (apixaban) daily prior to admission, now on No antithrombotic.  Given IPH, consider Pradaxa once resolved Therapy recommendations: No PT follow-up needed Disposition:  pending  Atrial fibrillation Hold anticoagulation given IPH Rate is well controlled Home medication- Amiodarone 200 mg  Hypertension Home meds: Losartan 12.5 mg Stable Resume losartan 12.5 mg, amlodipine 5 mg Cleviprex IV infusing-titrate down as able  Hyperlipidemia Home meds: None LDL 137, goal < 70 Hold statin given IPH  Other Stroke Risk Factors Advanced Age >/= 25  Hx TIA Frequent TIAs prior to starting eliquis  Other Active Problems GERD Arthritis Chronic systolic CHF Managed by Dr. Radford Pax with [facility] TEE done in 08/2020 EF 25-30%  Hospital day # 1  Patient seen and examined by NP/APP with MD. MD to update note as needed.   Janine Ores, DNP, FNP-BC [facility] Pager: [phone]  ATTENDING ATTESTATION: This is a gentleman with right thalamic hemorrhage while on Eliquis.  He has history of atrial fibrillation.  He was put on Eliquis about a year ago by his cardiologist.  He was given Andexxa for reversal in ED.  His exam is unremarkable and he is recovered.  Discussed with him we are ready to restart anticoagulation to consider Pradaxa.  We will repeat head CT tomorrow and if negative will transfer out of ICU.  Dr. Reeves Forth evaluated pt independently, reviewed imaging, chart, labs. Discussed and formulated plan with the APP. Please see APP note above for details.    This patient is critically ill due to right  thalamic hemorrhage with vasogenic edema, and at significant risk of neurological worsening, death form heart failure, respiratory failure, recurrent stroke, bleeding from [facility], seizure, sepsis. This patient's care requires constant monitoring of vital signs, hemodynamics, respiratory and cardiac monitoring, review of multiple databases, neurological assessment,  discussion with family, other specialists and medical decision making of high complexity. I spent 35 minutes of neurocritical care time in the care of this patient.   Oris Calmes,MD   To contact Stroke Continuity provider, please refer to [email or web address] After hours, contact General Neurology

## 2021-09-21 MED ORDER — MONTELUKAST SODIUM 10 MG PO TABS
10.0000 mg | ORAL_TABLET | Freq: Every day | ORAL | Status: DC
  Administered 2021-09-21: 10 mg via ORAL
  Filled 2021-09-21 (×2): qty 1

## 2021-09-21 MED ORDER — AMIODARONE HCL 200 MG PO TABS
200.0000 mg | ORAL_TABLET | Freq: Every day | ORAL | Status: DC
  Administered 2021-09-22: 200 mg via ORAL
  Filled 2021-09-21: qty 1

## 2021-09-21 MED ORDER — BUDESONIDE 0.5 MG/2ML IN SUSP
0.5000 mg | Freq: Two times a day (BID) | RESPIRATORY_TRACT | Status: DC
  Administered 2021-09-21 – 2021-09-22 (×2): 0.5 mg via RESPIRATORY_TRACT
  Filled 2021-09-21 (×2): qty 2

## 2021-09-21 MED ORDER — FAMOTIDINE 20 MG PO TABS
40.0000 mg | ORAL_TABLET | Freq: Every day | ORAL | Status: DC
  Administered 2021-09-21: 40 mg via ORAL
  Filled 2021-09-21: qty 2

## 2021-09-21 MED ORDER — DABIGATRAN ETEXILATE MESYLATE 150 MG PO CAPS
150.0000 mg | ORAL_CAPSULE | Freq: Two times a day (BID) | ORAL | Status: DC
  Administered 2021-09-22: 150 mg via ORAL
  Filled 2021-09-21 (×2): qty 1

## 2021-09-21 MED ORDER — POTASSIUM CHLORIDE CRYS ER 20 MEQ PO TBCR
20.0000 meq | EXTENDED_RELEASE_TABLET | Freq: Every day | ORAL | Status: DC
  Administered 2021-09-21 – 2021-09-22 (×2): 20 meq via ORAL
  Filled 2021-09-21 (×2): qty 1

## 2021-09-21 MED ORDER — LEVOTHYROXINE SODIUM 50 MCG PO TABS
50.0000 ug | ORAL_TABLET | Freq: Every day | ORAL | Status: DC
  Administered 2021-09-21 – 2021-09-22 (×2): 50 ug via ORAL
  Filled 2021-09-21 (×2): qty 1

## 2021-09-21 NOTE — Progress Notes (Addendum)
STROKE TEAM PROGRESS NOTE   INTERVAL HISTORY Patient seen in room, eating breakfast. He is eager to get out of here and get back to the gym and back to bowling. Repeat CT scan stable. Start pradaxa tomorrow. Transfer orders placed  Vitals:   09/21/21 0700 09/21/21 0800 09/21/21 0900 09/21/21 1000  BP: (!) 150/84 116/78 123/81 107/78  Pulse: 65 71 77 68  Resp: 13 16 17 16   Temp:  98.7 F (37.1 C)    TempSrc:  Oral    SpO2: 96% 94% 98% 97%  Weight:      Height:       CBC:  Recent Labs  Lab 09/19/21 1432 09/19/21 1449 09/20/21 0337  WBC 5.4  --  4.8  NEUTROABS 4.0  --   --   HGB 12.4* 13.3 11.8*  HCT 37.9* 39.0 36.4*  MCV 89.4  --  87.7  PLT 151  --  143*    Basic Metabolic Panel:  Recent Labs  Lab 09/19/21 1432 09/19/21 1449 09/19/21 1844 09/20/21 0337  NA 136 136  --  136  K 4.2 4.1  --  3.8  CL 103 103  --  104  CO2 22  --   --  22  GLUCOSE 92 88  --  94  BUN 9 12  --  9  CREATININE 1.22 1.10  --  1.14  CALCIUM 8.4*  --   --  8.6*  MG  --   --  2.2  --   PHOS  --   --  3.1  --     Lipid Panel: No results for input(s): CHOL, TRIG, HDL, CHOLHDL, VLDL, LDLCALC in the last 168 hours. HgbA1c:  Recent Labs  Lab 09/19/21 2057  HGBA1C 5.4    Urine Drug Screen:  Recent Labs  Lab 09/19/21 1944  LABOPIA NONE DETECTED  COCAINSCRNUR NONE DETECTED  LABBENZ NONE DETECTED  AMPHETMU NONE DETECTED  THCU NONE DETECTED  LABBARB NONE DETECTED     Alcohol Level  Recent Labs  Lab 09/19/21 2057  ETH <10     IMAGING past 24 hours CT HEAD WO CONTRAST (5MM)  Result Date: 09/20/2021 CLINICAL DATA:  Neuro deficit, acute, stroke suspected. Follow-up thalamic hemorrhage. EXAM: CT HEAD WITHOUT CONTRAST TECHNIQUE: Contiguous axial images were obtained from the base of the skull through the vertex without intravenous contrast. COMPARISON:  CT head without contrast 09/19/2021. MR head without contrast and CTA head and neck 09/20/2021. FINDINGS: Brain: The right thalamic  hyperdense hemorrhage is stable, measuring 1.3 x 0.6 x 1.1 cm. Surrounding vasogenic edema is again seen. No new hemorrhage is present. Basal ganglia are intact. No acute cortical infarct is present. The brainstem and cerebellum are within normal limits. Vascular: Atherosclerotic changes are present within the cavernous internal carotid arteries and vertebral arteries bilaterally. No hyperdense vessel is present. Skull: Calvarium is intact. No focal lytic or blastic lesions are present. No significant extracranial soft tissue lesion is present. Sinuses/Orbits: The paranasal sinuses and mastoid air cells are clear. Bilateral lens replacements are noted. Globes and orbits are otherwise unremarkable. IMPRESSION: 1. Stable right thalamic hyperdense hemorrhage with surrounding vasogenic edema. 2. No new hemorrhage. 3. Atherosclerosis. Electronically Signed   By: San Morelle M.D.   On: 09/20/2021 17:17    PHYSICAL EXAM  Temp:  [97.9 F (36.6 C)-98.7 F (37.1 C)] 98.7 F (37.1 C) (01/01 0800) Pulse Rate:  [59-77] 68 (01/01 1000) Resp:  [13-25] 16 (01/01 1000) BP: (107-164)/(66-101) 107/78 (01/01 1000)  SpO2:  [92 %-98 %] 97 % (01/01 1000)  General - Well nourished, well developed, in no apparent distress. Ophthalmologic - fundi not visualized due to noncooperation. Cardiovascular - Regular rhythm and rate.  Mental Status -  Level of arousal and orientation to time, place, and person were intact. Language including expression, naming, repetition, comprehension was assessed and found intact. Attention span and concentration were normal. Recent and remote memory were intact. Fund of Knowledge was assessed and was intact.  Cranial Nerves II - XII - II - Visual field intact OU. III, IV, VI - Extraocular movements intact. V - Facial sensation intact bilaterally. VII - Facial movement intact bilaterally. VIII - Hearing & vestibular intact bilaterally. X - Palate elevates symmetrically. XI -  Head is midline. Left shoulder weak. XII - Tongue protrusion intact.  Motor Strength - The patients strength was normal in all extremities and pronator drift was absent.  Bulk was normal and fasciculations were absent.  Weakness in left shoulder- baseline Motor Tone - Muscle tone was assessed at the neck and appendages and was normal. Reflexes - The patients reflexes were symmetrical in all extremities and he had no pathological reflexes. Sensory - Sensation is back to baseline, diminished sensation in left leg s/p spine surgery Coordination - The patient had normal movements in the hands and feet with no ataxia or dysmetria.  Tremor was absent. Gait and Station - deferred.   ASSESSMENT/PLAN Tony Roberts is a 81 y.o. male with history of afib on eliquis, previous spinal surgery with residual left leg numbness presenting with worsening numbness starting 3 days ago, this was followed by numbness of his left hand. CT showed a right thalamic hematoma with vasogenic edema. He was given Andexxa for Eliquis reversal in the ED. MRI showed the previous hematoma as well as punctate infarcts in the left temporal lobe and left basal ganglia. LDL 137, Ha1C 5.4, repeat CT scheduled for today.   Stroke:  right thalamic IPH likely secondary to Eliquis usage for A. fib s/p Andexxa reversal now showing punctate ischemic infarcts involving the mesial left temporal lobe and left basal ganglia Code Stroke - acute 0.5cc right thalamic hematoma with surrounding vasogenic edema. Periventricular white matter and corona radiata hypodensities. Atherosclerosis Repeat CT pending  CTA head & neck negative for LVO, moderate distal left M1 stenosis, moderate to severe bilateral V4 stenosis, fetal type origin of the PCAs with diminutive vertebrobasilar system MRI  1cm acute IPH at right thalamus, few punctate acute ischemic infarcts involving the mesial left temporal lobe and left basal ganglia 2D Echo LVEF is 55 to 60%.   Left ventricle has normal function, no regional wall abnormalities.  No shunt detected by color-flow Doppler LDL 137 HgbA1c 5.4 VTE prophylaxis - SCDs Eliquis (apixaban) daily prior to admission, now on No antithrombotic.  Given IPH, consider Pradaxa once resolved Therapy recommendations: No PT follow-up needed Disposition:  pending  Atrial fibrillation Hold anticoagulation given IPH Rate is well controlled Home medication- Amiodarone 200 mg  Hypertension Home meds: Losartan 12.5 mg Stable Resume losartan 12.5 mg, amlodipine 5 mg Cleviprex IV infusing-titrate down as able  Hyperlipidemia Home meds: None LDL 137, goal < 70 Hold statin given IPH  Other Stroke Risk Factors Advanced Age >/= 61  Hx TIA Frequent TIAs prior to starting eliquis  Other Active Problems GERD Arthritis Chronic systolic CHF Managed by Dr. Radford Pax with [facility] TEE done in 08/2020 EF 25-30%  Hospital day # 2  Patient seen  and examined by NP/APP with MD. MD to update note as needed.   Janine Ores, DNP, FNP-BC [facility] Pager: [phone]  ATTENDING ATTESTATION: Pt with right thalamic hemorrhage while on Eliquis.  He has history of atrial fibrillation.  He was put on Eliquis about a year ago by his cardiologist.  He was given Andexxa for reversal in ED.  His exam is unremarkable Repeat CTH stable. Transfer out of ICU, tentative plan to start [facility] tomorrow.     Dr. Reeves Forth evaluated pt independently, reviewed imaging, chart, labs. Discussed and formulated plan with the APP. Please see APP note above for details.   Total 30 minutes spent on counseling patient and coordinating care, writing notes and reviewing chart.  Anna Beaird,MD    To contact Stroke Continuity provider, please refer to [email or web address] After hours, contact General Neurology

## 2021-09-21 NOTE — Progress Notes (Signed)
ANTICOAGULATION CONSULT NOTE - Initial Consult  Pharmacy Consult for Pradaxa Indication: atrial fibrillation, CVA  Allergies  Allergen Reactions   Lisinopril     Other reaction(s): headache    Patient Measurements: Height: 5\' 6"  (167.6 cm) Weight: 79.4 kg (175 lb) IBW/kg (Calculated) : 63.8  Vital Signs: Temp: 98.7 F (37.1 C) (01/01 0800) Temp Source: Oral (01/01 0800) BP: 123/81 (01/01 0900) Pulse Rate: 77 (01/01 0900)  Labs: Recent Labs    09/19/21 1432 09/19/21 1449 09/20/21 0337  HGB 12.4* 13.3 11.8*  HCT 37.9* 39.0 36.4*  PLT 151  --  143*  APTT 38*  --   --   LABPROT 16.9*  --   --   INR 1.4*  --   --   CREATININE 1.22 1.10 1.14    Estimated Creatinine Clearance: 51.2 mL/min (by C-G formula based on SCr of 1.14 mg/dL).   Medical History: Past Medical History:  Diagnosis Date   Acquired dilation of ascending aorta and aortic root ([place])    62mm by echo 2022 and 87mm by cardiac MRI 03/2021   Aortic insufficiency    mild by cardiac MRI 03/2021   Arthritis    Asthma    last asthma attack at age 49   Dilated cardiomyopathy ([place])    likely tachy mediated from atrial fibrillation.  LVF normalized on echo 01/2021   GERD (gastroesophageal reflux disease)    Headache    Mitral regurgitation    mild by cardiac MRI 03/2021   Other specified iron deficiency anemias    PAC (premature atrial contraction)    occasional PAC's   PAF (paroxysmal atrial fibrillation) ([place])    s/p TEE/DCCV 08/2020   TIA (transient ischemic attack)    3 TIAs in last 1 month.  Each episode last about 30 minutes, symptoms was one-sided numbness and facial droop and speech problems. MRI 08/2020 showed A tiny chronic infarct     Assessment: CC/HPI: hemorrhagic stroke of right thalamus Anticoag: on eliquis pta for afib-- s/p andexxa. SCDs - Hgb 12's at baseline>11.8. Plts 143.  Goal of Therapy:  Therapeutic oral anticoagulation  Plan:  Start Pradaxa on 09/22/21: 150mg  BID  Jinan Biggins S.  Alford Highland, PharmD, BCPS Clinical Staff Pharmacist [email or web address]  Alford Highland, [employer] 09/21/2021,9:47 AM

## 2021-09-21 NOTE — Progress Notes (Signed)
Patient transferred by wheelchair with all of his belongings to [phone]. He did not want me to call his wife beforehand to tell her he was moving. He said he would call her in the room when he got there. Shae Hinnenkamp, Rande Brunt, RN

## 2021-09-21 NOTE — Discharge Instructions (Addendum)
Information on my medicine - Pradaxa (dabigatran)  Why was Pradaxa prescribed for you? Pradaxa was prescribed for you to reduce the risk of forming blood clots that cause a stroke if you have a medical condition called atrial fibrillation (a type of irregular heartbeat).    What do you Need to know about PradAXa? Take your Pradaxa TWICE DAILY - one capsule in the morning and one tablet in the evening with or without food.  It would be best to take the doses about the same time each day.  The capsules should not be broken, chewed or opened - they must be swallowed whole.  Do not store Pradaxa in other medication containers - once the bottle is opened the Pradaxa should be used within FOUR months; throw away any capsules that havent been by that time.  Take Pradaxa exactly as prescribed by your doctor.  DO NOT stop taking Pradaxa without talking to the doctor who prescribed the medication.  Stopping without other stroke prevention medication to take the place of Pradaxa may increase your risk of developing a clot that causes a stroke.  Refill your prescription before you run out.  After discharge, you should have regular check-up appointments with your healthcare provider that is prescribing your Pradaxa.  In the future your dose may need to be changed if your kidney function or weight changes by a significant amount.  What do you do if you miss a dose? If you miss a dose, take it as soon as you remember on the same day.  If your next dose is less than 6 hours away, skip the missed dose.  Do not take two doses of PRADAXA at the same time.  Important Safety Information A possible side effect of Pradaxa is bleeding. You should call your healthcare provider right away if you experience any of the following: Bleeding from an injury or your nose that does not stop. Unusual colored urine (red or dark brown) or unusual colored stools (red or black). Unusual bruising for unknown reasons. A  serious fall or if you hit your head (even if there is no bleeding).  Some medicines may interact with Pradaxa and might increase your risk of bleeding or clotting while on Pradaxa. To help avoid this, consult your healthcare provider or pharmacist prior to using any new prescription or non-prescription medications, including herbals, vitamins, non-steroidal anti-inflammatory drugs (NSAIDs) and supplements.  This website has more information on Pradaxa (dabigatran): https://www.pradaxa.com    Mr. Tony Roberts, you were admitted with a right thalamic hemorrhagic stroke.  You also had small ischemic strokes in the right temporal lobe and right basal ganglia.  You will need to stop taking your Eliquis due to the bleeding.  Instead, you will take a baby aspirin daily for ten days and then begin taking Pradaxa.  If you have recurrent symptoms of a stroke, call 911 and go to the ER immediately.

## 2021-09-22 ENCOUNTER — Other Ambulatory Visit (HOSPITAL_COMMUNITY): Payer: Self-pay

## 2021-09-22 DIAGNOSIS — I610 Nontraumatic intracerebral hemorrhage in hemisphere, subcortical: Secondary | ICD-10-CM

## 2021-09-22 LAB — LIPID PANEL
Cholesterol: 254 mg/dL — ABNORMAL HIGH (ref 0–200)
HDL: 37 mg/dL — ABNORMAL LOW (ref >40)
LDL Cholesterol: 192 mg/dL — ABNORMAL HIGH (ref 0–99)
Total CHOL/HDL Ratio: 6.9 RATIO
Triglycerides: 123 mg/dL (ref <150)
VLDL: 25 mg/dL (ref 0–40)

## 2021-09-22 MED ORDER — ATORVASTATIN CALCIUM 20 MG PO TABS
20.0000 mg | ORAL_TABLET | Freq: Every day | ORAL | 1 refills | Status: DC
Start: 2021-09-22 — End: 2021-11-17
  Filled 2021-09-22: qty 30, 30d supply, fill #0
  Filled 2021-10-23: qty 30, 30d supply, fill #1

## 2021-09-22 MED ORDER — ASPIRIN 81 MG PO TBEC
81.0000 mg | DELAYED_RELEASE_TABLET | Freq: Every day | ORAL | 0 refills | Status: DC
  Filled 2021-09-22: qty 10, 10d supply, fill #0

## 2021-09-22 MED ORDER — ATORVASTATIN CALCIUM 80 MG PO TABS
80.0000 mg | ORAL_TABLET | Freq: Every day | ORAL | 1 refills | Status: DC
  Filled 2021-09-22: qty 30, 30d supply, fill #0

## 2021-09-22 MED ORDER — ESOMEPRAZOLE MAGNESIUM 40 MG PO CPDR
40.0000 mg | DELAYED_RELEASE_CAPSULE | Freq: Every day | ORAL | 0 refills | Status: DC
Start: 2021-09-22 — End: 2021-12-29
  Filled 2021-09-22 – 2021-11-26 (×2): qty 30, 30d supply, fill #0

## 2021-09-22 MED ORDER — ASPIRIN EC 81 MG PO TBEC: 81.0000 mg | DELAYED_RELEASE_TABLET | Freq: Every day | ORAL | Status: DC

## 2021-09-22 MED ORDER — ATORVASTATIN CALCIUM 10 MG PO TABS: 20.0000 mg | ORAL_TABLET | Freq: Every day | ORAL | Status: DC

## 2021-09-22 MED ORDER — AMIODARONE HCL 200 MG PO TABS
200.0000 mg | ORAL_TABLET | Freq: Every day | ORAL | 0 refills | Status: DC
Start: 2021-09-23 — End: 2021-11-05
  Filled 2021-09-22 – 2021-10-02 (×2): qty 30, 30d supply, fill #0

## 2021-09-22 MED ORDER — AMLODIPINE BESYLATE 5 MG PO TABS
5.0000 mg | ORAL_TABLET | Freq: Every day | ORAL | 0 refills | Status: DC
  Filled 2021-09-22: qty 30, 30d supply, fill #0

## 2021-09-22 MED ORDER — ACETAMINOPHEN 325 MG PO TABS
650.0000 mg | ORAL_TABLET | ORAL | 0 refills | Status: AC | PRN
Start: 2021-09-22 — End: ?
  Filled 2021-09-22: qty 30, 3d supply, fill #0

## 2021-09-22 MED ORDER — DABIGATRAN ETEXILATE MESYLATE 150 MG PO CAPS
150.0000 mg | ORAL_CAPSULE | Freq: Two times a day (BID) | ORAL | 1 refills | Status: DC
Start: 2021-10-02 — End: 2021-12-01
  Filled 2021-09-22 – 2021-10-02 (×3): qty 60, 30d supply, fill #0
  Filled 2021-10-31: qty 60, 30d supply, fill #1

## 2021-09-22 MED ORDER — ATORVASTATIN CALCIUM 80 MG PO TABS
80.0000 mg | ORAL_TABLET | Freq: Every day | ORAL | Status: DC
Start: 2021-09-22 — End: 2021-09-22

## 2021-09-22 NOTE — TOC Transition Note (Signed)
Transition of Care [facility]) - CM/SW Discharge Note   Patient Details  Name: Tony Roberts MRN: [id number] Date of Birth: [birth year]  [facility] CM/SW Contact:  Pollie Friar, RN Phone Number: 09/22/2021, 11:29 AM   Clinical Narrative:    Patient is from home with his spouse who can provide supervision at home. Patient doesn't currently use any DME at home. He denies any issues with home medications and does the driving for home.  Pradaxa co pay is $90/ month after he reaches his deductible. Pt states he can afford this co pay.  Pt has transportation home when discharged.    Final next level of care: Home/Self Care Barriers to Discharge: No Barriers Identified   Patient Goals and CMS Choice        Discharge Placement                       Discharge Plan and Services                                     Social Determinants of Health (SDOH) Interventions     Readmission Risk Interventions No flowsheet data found.

## 2021-09-22 NOTE — Discharge Summary (Addendum)
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 fib Principal Problem:   ICH (intracerebral hemorrhage) (HCC) Cytotoxic edema Atrial fibrillation Ischemic strokes  Allergies as of 09/22/2021       Reactions   Eliquis [apixaban] Other (See Comments)   Eliquis failure: stroke on Eliquis   Lisinopril    Other reaction(s): headache        Medication List     STOP taking these medications    Advair Diskus 250-50 MCG/ACT Aepb Generic drug: fluticasone-salmeterol   ciprofloxacin 0.3 % ophthalmic solution Commonly known as: CILOXAN   Eliquis 5 MG Tabs tablet Generic drug: apixaban   fluorouracil 5 % cream Commonly known as: EFUDEX   furosemide 20 MG tablet Commonly known as: LASIX   loratadine 10 MG tablet Commonly known as: Claritin   metoprolol succinate 25 MG 24 hr tablet Commonly known as: Toprol XL       TAKE these medications    acetaminophen 325 MG tablet Commonly known as: TYLENOL Take 2 tablets (650 mg total) by mouth every 4 (four) hours as needed for mild pain (or temp > 37.5 C (99.5 F)). What changed:  medication strength how much to take when to take this reasons to take this   albuterol 108 (90 Base) MCG/ACT inhaler Commonly known as: Proventil HFA Inhale two puffs every four to six hours as needed for cough or wheeze. What changed:  how much to take when to take this reasons to take this additional instructions   amiodarone 200 MG tablet Commonly known as: PACERONE Take 1 tablet (200 mg total) by mouth daily. Start taking on: September 23, 2021    amLODipine 5 MG tablet Commonly known as: NORVASC Take 1 tablet (5 mg total) by mouth daily. Start taking on: September 23, 2021   aspirin 81 MG EC tablet Take 1 tablet (81 mg total) by mouth daily for 10 days. Swallow whole.   atorvastatin 20 MG tablet Commonly known as: LIPITOR Take 1 tablet (20 mg total) by mouth daily.   dabigatran 150 MG Caps capsule Commonly known as: PRADAXA Take 1 capsule (150 mg total) by mouth every 12 (twelve) hours. Start taking on: October 02, 2021   esomeprazole 40 MG capsule Commonly known as: NEXIUM Take 1 capsule (40 mg total) by mouth daily.   famotidine 40 MG tablet Commonly known as: PEPCID Take 1 tablet (40 mg total) by mouth at bedtime. What changed: when to take this   Flovent Diskus 250 MCG/ACT Aepb Generic drug: Fluticasone Propionate (Inhal) Inhale 1 puff into the lungs in the morning and at bedtime. What changed: Another medication with the same name was removed. Continue taking this medication, and follow the directions you see here.   levothyroxine 50 MCG tablet Commonly known as: SYNTHROID Take 1 tablet (50 mcg total) by mouth in the morning on an empty stomach   losartan 25 MG tablet Commonly known as: COZAAR Take 0.5 tablets (12.5 mg total) by mouth daily.   montelukast 10 MG tablet Commonly known as: SINGULAIR Take 1 tablet (10 mg total) by  mouth at bedtime.   potassium chloride SA 20 MEQ tablet Commonly known as: KLOR-CON M Take 1 tablet (20 mEq total) by mouth daily.   triamcinolone 55 MCG/ACT Aero nasal inhaler Commonly known as: NASACORT Place 2 sprays into the nose daily as needed (for allergies or rhinitis).        LABORATORY STUDIES CBC    Component Value Date/Time   WBC 4.8 09/20/2021 0337   RBC 4.15 (L) 09/20/2021 0337   HGB 11.8 (L) 09/20/2021 0337   HGB 12.1 (L) 07/03/2021 0915   HGB 10.4 (L) 02/10/2012 1312   HCT 36.4 (L) 09/20/2021 0337   HCT 36.4 (L) 07/03/2021 0915   HCT 33.5 (L)  02/10/2012 1312   PLT 143 (L) 09/20/2021 0337   PLT 159 07/03/2021 0915   MCV 87.7 09/20/2021 0337   MCV 87 07/03/2021 0915   MCV 69.7 (L) 02/10/2012 1312   MCH 28.4 09/20/2021 0337   MCHC 32.4 09/20/2021 0337   RDW 15.1 09/20/2021 0337   RDW 13.2 07/03/2021 0915   RDW 17.8 (H) 02/10/2012 1312   LYMPHSABS 0.7 09/19/2021 1432   LYMPHSABS 1.3 02/10/2012 1312   MONOABS 0.5 09/19/2021 1432   MONOABS 0.4 02/10/2012 1312   EOSABS 0.2 09/19/2021 1432   EOSABS 0.2 02/10/2012 1312   BASOSABS 0.0 09/19/2021 1432   BASOSABS 0.1 02/10/2012 1312   CMP    Component Value Date/Time   NA 136 09/20/2021 0337   NA 136 07/03/2021 0915   K 3.8 09/20/2021 0337   CL 104 09/20/2021 0337   CO2 22 09/20/2021 0337   GLUCOSE 94 09/20/2021 0337   BUN 9 09/20/2021 0337   BUN 14 07/03/2021 0915   CREATININE 1.14 09/20/2021 0337   CREATININE 0.95 09/26/2012 1431   CALCIUM 8.6 (L) 09/20/2021 0337   PROT 5.6 (L) 09/20/2021 0337   ALBUMIN 3.0 (L) 09/20/2021 0337   AST 15 09/20/2021 0337   ALT 11 09/20/2021 0337   ALKPHOS 74 09/20/2021 0337   BILITOT 0.8 09/20/2021 0337   GFRNONAA >60 09/20/2021 0337   GFRAA 70 09/10/2020 1200   COAGS Lab Results  Component Value Date   INR 1.4 (H) 09/19/2021   INR 1.11 10/23/2016   Lipid Panel    Component Value Date/Time   CHOL 187 08/21/2020 1629   TRIG 64 08/21/2020 1629   HDL 37 (L) 08/21/2020 1629   CHOLHDL 5.1 08/21/2020 1629   VLDL 13 08/21/2020 1629   LDLCALC 137 (H) 08/21/2020 1629   HgbA1C  Lab Results  Component Value Date   HGBA1C 5.4 09/19/2021   Urinalysis    Component Value Date/Time   COLORURINE YELLOW 09/19/2021 1944   APPEARANCEUR CLEAR 09/19/2021 1944   LABSPEC 1.006 09/19/2021 1944   PHURINE 6.0 09/19/2021 1944   GLUCOSEU NEGATIVE 09/19/2021 1944   HGBUR SMALL (A) 09/19/2021 1944   BILIRUBINUR NEGATIVE 09/19/2021 1944   KETONESUR NEGATIVE 09/19/2021 1944   PROTEINUR NEGATIVE 09/19/2021 1944   NITRITE NEGATIVE 09/19/2021  1944   LEUKOCYTESUR NEGATIVE 09/19/2021 1944   Urine Drug Screen     Component Value Date/Time   LABOPIA NONE DETECTED 09/19/2021 1944   COCAINSCRNUR NONE DETECTED 09/19/2021 1944   LABBENZ NONE DETECTED 09/19/2021 1944   AMPHETMU NONE DETECTED 09/19/2021 1944   THCU NONE DETECTED 09/19/2021 1944   LABBARB NONE DETECTED 09/19/2021 1944    Alcohol Level    Component Value Date/Time   ETH <10 09/19/2021 2057     SIGNIFICANT DIAGNOSTIC STUDIES CT ANGIO HEAD  NECK W WO CM  Result Date: 09/20/2021 CLINICAL DATA:  Initial evaluation for hemorrhagic stroke. EXAM: CT ANGIOGRAPHY HEAD AND NECK TECHNIQUE: Multidetector CT imaging of the head and neck was performed using the standard protocol during bolus administration of intravenous contrast. Multiplanar CT image reconstructions and MIPs were obtained to evaluate the vascular anatomy. Carotid stenosis measurements (when applicable) are obtained utilizing NASCET criteria, using the distal internal carotid diameter as the denominator. CONTRAST:  50mL OMNIPAQUE IOHEXOL 350 MG/ML SOLN COMPARISON:  Prior MRI from earlier the same day as well as prior CT from 09/19/2021. FINDINGS: CTA NECK FINDINGS Aortic arch: Visualized aortic arch normal in caliber with normal branch pattern. Mild atheromatous change about the arch itself. No hemodynamically significant stenosis seen about the origin the great vessels. Right carotid system: Right common and internal carotid arteries mildly tortuous but are widely patent without stenosis, dissection, or occlusion. Mild for age atheromatous plaque about the right carotid bulb without stenosis. Left carotid system: Left common and internal carotid arteries are mildly tortuous but widely patent without stenosis, dissection, or occlusion. Mild for age atheromatous change about the left carotid bulb without stenosis. Vertebral arteries: Both vertebral arteries arise from subclavian arteries. No proximal subclavian artery  stenosis. Vertebral arteries patent without stenosis, dissection or occlusion. Skeleton: No discrete or worrisome osseous lesions. Congenital fusion of C2 and C3 noted. Moderate spondylosis at C5-6 and C6-7. Patient is edentulous. Other neck: No other acute soft tissue abnormality within the neck. No mass or adenopathy. Approximate 1.7 cm nodule noted extending from the inferior right thyroid pole (series 5, image 143), indeterminate. Upper chest: Scattered interlobular septal thickening seen within the visualized lungs, suggesting pulmonary interstitial congestion. Superimposed scattered atelectatic changes noted. Visualized upper chest demonstrates no other acute finding. Review of the MIP images confirms the above findings CTA HEAD FINDINGS Anterior circulation: Petrous segments patent bilaterally. Scattered atheromatous change within the carotid siphons without hemodynamically significant stenosis. A1 segments patent bilaterally. Normal anterior communicating artery complex. Anterior cerebral arteries patent to their distal aspects without stenosis. Right M1 segment widely patent. Normal right MCA bifurcation. Distal right MCA branches well perfused. Left M1 segment widely patent proximally. Focal moderate distal left M1 stenosis noted (series 10, image 19). Normal left MCA bifurcation. Distal left MCA branches patent and well perfused. Posterior circulation: Both vertebral arteries widely patent as it crosses into the cranial vault. Atheromatous change about the mid V4 segments bilaterally with associated moderate to severe stenoses (series 7, image 152). Both PICA patent. Basilar diminutive but patent to its distal aspect without significant stenosis. Right SCA arises from the basilar and is widely patent. Left SCA supplied via the left PCA is also widely patent. Fetal type origin of the PCAs noted bilaterally. Both PCAs well perfused to their distal aspects. Venous sinuses: Patent allowing for timing the  contrast bolus. Anatomic variants: Fetal type origin of the PCAs with overall diminutive vertebrobasilar system. No intracranial aneurysm. No other vascular malformation seen underlying the acute right thalamic hemorrhage. Review of the MIP images confirms the above findings IMPRESSION: 1. Negative CTA for large vessel occlusion. No vascular malformation seen underlying the acute right thalamic hemorrhage. 2. Intracranial atherosclerotic disease with associated moderate distal left M1 stenosis, with moderate to severe bilateral V4 stenoses as above. 3. Mild for age atheromatous change elsewhere about the carotid bifurcations and carotid siphons. No other hemodynamically significant or correctable stenosis. 4. Fetal type origin of the PCAs with overall diminutive vertebrobasilar system. 5. Diffuse interlobular septal thickening  within the visualized lungs, suggesting pulmonary interstitial congestion/edema. 6. 1.7 cm right thyroid nodule, indeterminate. Further evaluation with dedicated thyroid ultrasound recommended. This could be performed on a nonemergent outpatient basis. (ref: J Am Coll Radiol. 2015 Feb;12(2): 143-50). Electronically Signed   By: Jeannine Boga M.D.   On: 09/20/2021 01:52   CT HEAD WO CONTRAST (5MM)  Result Date: 09/20/2021 CLINICAL DATA:  Neuro deficit, acute, stroke suspected. Follow-up thalamic hemorrhage. EXAM: CT HEAD WITHOUT CONTRAST TECHNIQUE: Contiguous axial images were obtained from the base of the skull through the vertex without intravenous contrast. COMPARISON:  CT head without contrast 09/19/2021. MR head without contrast and CTA head and neck 09/20/2021. FINDINGS: Brain: The right thalamic hyperdense hemorrhage is stable, measuring 1.3 x 0.6 x 1.1 cm. Surrounding vasogenic edema is again seen. No new hemorrhage is present. Basal ganglia are intact. No acute cortical infarct is present. The brainstem and cerebellum are within normal limits. Vascular: Atherosclerotic  changes are present within the cavernous internal carotid arteries and vertebral arteries bilaterally. No hyperdense vessel is present. Skull: Calvarium is intact. No focal lytic or blastic lesions are present. No significant extracranial soft tissue lesion is present. Sinuses/Orbits: The paranasal sinuses and mastoid air cells are clear. Bilateral lens replacements are noted. Globes and orbits are otherwise unremarkable. IMPRESSION: 1. Stable right thalamic hyperdense hemorrhage with surrounding vasogenic edema. 2. No new hemorrhage. 3. Atherosclerosis. Electronically Signed   By: San Morelle M.D.   On: 09/20/2021 17:17   CT HEAD WO CONTRAST  Result Date: 09/19/2021 CLINICAL DATA:  Dizziness and left leg numbness. EXAM: CT HEAD WITHOUT CONTRAST TECHNIQUE: Contiguous axial images were obtained from the base of the skull through the vertex without intravenous contrast. COMPARISON:  08/23/2020 FINDINGS: Brain: Acute 1.4 by 0.7 by 1.0 cm (volume = 0.5 cm^3) right thalamic hematoma with surrounding vasogenic edema. No intraventricular hemorrhage or other abnormal intracranial blood products are identified. Periventricular white matter and corona radiata hypodensities favor chronic ischemic microvascular white matter disease. The brainstem, cerebellum, and basal ganglia appear unremarkable. No mass lesion or acute CVA is identified. Vascular: There is atherosclerotic calcification of the cavernous carotid arteries bilaterally. Skull: Unremarkable Sinuses/Orbits: Chronic bilateral maxillary and ethmoid sinusitis with mild chronic right frontal and left sphenoid sinusitis. Other: No supplemental non-categorized findings. IMPRESSION: 1. Acute 0.5 cc right thalamic hematoma with surrounding vasogenic edema. 2. Periventricular white matter and corona radiata hypodensities favor chronic ischemic microvascular white matter disease. 3. Chronic paranasal sinusitis. 4. Atherosclerosis. Critical Value/emergent results  were called by telephone at the time of interpretation on 09/19/2021 at 5:03 pm to provider Dr. Regenia Skeeter, who verbally acknowledged these results. Electronically Signed   By: Van Clines M.D.   On: 09/19/2021 17:04   MR BRAIN WO CONTRAST  Result Date: 09/20/2021 CLINICAL DATA:  Initial evaluation for acute dizziness, acute intracranial hemorrhage. EXAM: MRI HEAD WITHOUT CONTRAST TECHNIQUE: Multiplanar, multiecho pulse sequences of the brain and surrounding structures were obtained without intravenous contrast. COMPARISON:  Prior CT from 09/19/2021. FINDINGS: Brain: Cerebral volume within normal limits for age. Patchy T2/FLAIR hyperintensity involving the periventricular deep white matter both cerebral hemispheres most consistent with chronic small vessel ischemic disease, mild in nature. Few tiny remote bilateral cerebellar infarcts noted. Approximate 1 cm acute intraparenchymal hemorrhage centered at the right thalamus again seen, relatively stable from prior CT (series 10, image 13). Mild localized edema without significant regional mass effect. No intraventricular extension or other complication. No visible underlying mass or other structural abnormality on this noncontrast  examination. Few additional punctate foci of restricted diffusion seen involving the mesial left temporal lobe and left basal ganglia (series 5, images 70, 72, 74), consistent with acute ischemic infarcts. Largest of these foci measures 5 mm. No associated hemorrhage or mass effect. No other foci of restricted diffusion to suggest acute or subacute ischemia. Gray-white matter differentiation otherwise maintained. No other areas of chronic cortical infarction. No other evidence for acute or chronic intracranial hemorrhage. No mass lesion, midline shift, or significant mass effect. No hydrocephalus or extra-axial fluid collection. Note made of a partially empty sella. Midline structures intact. Vascular: Major intracranial vascular  flow voids are maintained. Skull and upper cervical spine: Craniocervical junction within normal limits. Bone marrow signal intensity normal. No scalp soft tissue abnormality. Sinuses/Orbits: Patient status post bilateral ocular lens replacement. Globes and orbital soft tissues demonstrate no acute finding. Scattered mucosal thickening noted throughout the ethmoidal air cells and maxillary sinuses. No significant mastoid effusion. Inner ear structures grossly normal. Other: None. IMPRESSION: 1. Approximate 1 cm acute intraparenchymal hemorrhage centered at the right thalamus, not significantly changed from prior CT. Associated mild localized edema without significant regional mass effect or midline shift. No intraventricular extension or other complication. 2. Few additional punctate acute ischemic infarcts involving the mesial left temporal lobe and left basal ganglia. 3. Underlying mild chronic microvascular ischemic disease. Electronically Signed   By: Jeannine Boga M.D.   On: 09/20/2021 00:41   ECHOCARDIOGRAM COMPLETE  Result Date: 09/20/2021    ECHOCARDIOGRAM REPORT   Patient Name:   Tony Roberts Date of Exam: 09/20/2021 Medical Rec #:  [id number]      Height:       66.0 in Accession #:    [id number]     Weight:       175.0 lb Date of Birth:  1941-07-24      BSA:          1.889 m Patient Age:    4 years       BP:           132/69 mmHg Patient Gender: M              HR:           64 bpm. Exam Location:  Inpatient Procedure: 2D Echo, Color Doppler and Cardiac Doppler Indications:    Stroke  History:        Patient has prior history of Echocardiogram examinations.  Sonographer:    Jyl Heinz Referring Phys: [id number] [place]  1. Left ventricular ejection fraction, by estimation, is 55 to 60%. The left ventricle has normal function. The left ventricle has no regional wall motion abnormalities. There is mild left ventricular hypertrophy. Left ventricular diastolic parameters are  consistent with Grade I diastolic dysfunction (impaired relaxation).  2. Right ventricular systolic function is normal. The right ventricular size is normal.  3. Left atrial size was mildly dilated.  4. The mitral valve is abnormal. Trivial mitral valve regurgitation. No evidence of mitral stenosis.  5. The aortic valve is tricuspid. There is mild calcification of the aortic valve. There is mild thickening of the aortic valve. Aortic valve regurgitation is moderate. Aortic valve sclerosis/calcification is present, without any evidence of aortic stenosis.  6. Aortic dilatation noted. There is mild dilatation of the aortic root, measuring 39 mm.  7. The inferior vena cava is normal in size with greater than 50% respiratory variability, suggesting right atrial pressure of 3 mmHg. FINDINGS  Left Ventricle:  Left ventricular ejection fraction, by estimation, is 55 to 60%. The left ventricle has normal function. The left ventricle has no regional wall motion abnormalities. The left ventricular internal cavity size was normal in size. There is  mild left ventricular hypertrophy. Left ventricular diastolic parameters are consistent with Grade I diastolic dysfunction (impaired relaxation). Right Ventricle: The right ventricular size is normal. No increase in right ventricular wall thickness. Right ventricular systolic function is normal. Left Atrium: Left atrial size was mildly dilated. Right Atrium: Right atrial size was normal in size. Pericardium: There is no evidence of pericardial effusion. Mitral Valve: The mitral valve is abnormal. There is mild thickening of the mitral valve leaflet(s). There is mild calcification of the mitral valve leaflet(s). Mild mitral annular calcification. Trivial mitral valve regurgitation. No evidence of mitral valve stenosis. Tricuspid Valve: The tricuspid valve is normal in structure. Tricuspid valve regurgitation is not demonstrated. No evidence of tricuspid stenosis. Aortic Valve: The  aortic valve is tricuspid. There is mild calcification of the aortic valve. There is mild thickening of the aortic valve. Aortic valve regurgitation is moderate. Aortic regurgitation PHT measures 601 msec. Aortic valve sclerosis/calcification is present, without any evidence of aortic stenosis. Aortic valve peak gradient measures 12.7 mmHg. Pulmonic Valve: The pulmonic valve was normal in structure. Pulmonic valve regurgitation is not visualized. No evidence of pulmonic stenosis. Aorta: Aortic dilatation noted. There is mild dilatation of the aortic root, measuring 39 mm. Venous: The inferior vena cava is normal in size with greater than 50% respiratory variability, suggesting right atrial pressure of 3 mmHg. IAS/Shunts: No atrial level shunt detected by color flow Doppler.  LEFT VENTRICLE PLAX 2D LVIDd:         5.00 cm      Diastology LVIDs:         3.30 cm      LV e' medial:    4.46 cm/s LV PW:         1.00 cm      LV E/e' medial:  10.6 LV IVS:        1.20 cm      LV e' lateral:   5.11 cm/s LVOT diam:     2.00 cm      LV E/e' lateral: 9.3 LV SV:         73 LV SV Index:   38 LVOT Area:     3.14 cm  LV Volumes (MOD) LV vol d, MOD A2C: 121.0 ml LV vol d, MOD A4C: 144.0 ml LV vol s, MOD A2C: 51.9 ml LV vol s, MOD A4C: 60.6 ml LV SV MOD A2C:     69.1 ml LV SV MOD A4C:     144.0 ml LV SV MOD BP:      76.4 ml RIGHT VENTRICLE             IVC RV Basal diam:  2.60 cm     IVC diam: 1.70 cm RV Mid diam:    2.30 cm RV S prime:     14.10 cm/s TAPSE (M-mode): 2.3 cm LEFT ATRIUM             Index        RIGHT ATRIUM           Index LA diam:        4.00 cm 2.12 cm/m   RA Area:     14.90 cm LA Vol (A2C):   57.8 ml 30.59 ml/m  RA Volume:   28.90 ml  15.30 ml/m LA Vol (A4C):   46.3 ml 24.50 ml/m LA Biplane Vol: 52.3 ml 27.68 ml/m  AORTIC VALVE AV Area (Vmax): 2.70 cm AV Vmax:        178.00 cm/s AV Peak Grad:   12.7 mmHg LVOT Vmax:      153.00 cm/s LVOT Vmean:     95.600 cm/s LVOT VTI:       0.231 m AI PHT:         601 msec   AORTA Ao Root diam: 3.90 cm Ao Asc diam:  3.50 cm MITRAL VALVE MV Area (PHT): 4.12 cm    SHUNTS MV Decel Time: 184 msec    Systemic VTI:  0.23 m MV E velocity: 47.40 cm/s  Systemic Diam: 2.00 cm MV A velocity: 84.60 cm/s MV E/A ratio:  0.56 Jenkins Rouge MD Electronically signed by Jenkins Rouge MD Signature Date/Time: 09/20/2021/11:32:10 AM    Final       HISTORY OF PRESENT ILLNESS Patient with a history of afib on Eliquis and spinal surgery with left leg numbness presented with left hand numbness and worsening left leg numbness   HOSPITAL COURSE On admission, patient was found to have a right thalamic IPH.  Eliquis was reversed with Andexxa.  MRI revealed a few punctate ischemic infarcts in left temporal lobe and left basal ganglia.  Cleviprex was used initially to control blood pressure, but this has been weaned off.  Due to presence of ischemic strokes, patient will begin taking ASA 81 mg daily tomorrow and Pradaxa 14 days after his IPH.  Patient had been ordered Pradaxa to start today and did receive a dose prior to being seen by the team.  Will hold off on aspirin today, begin aspirin tomorrow and start Pradaxa as planned in 10 days.  Atorvastatin was prescribed on discharge to control hyperlipidemia, and patient will follow up on lipid panel results with Dr. [name] after discharge.  RN Pressure Injury Documentation:     DISCHARGE EXAM Blood pressure 132/80, pulse 64, temperature 98 F (36.7 C), temperature source Oral, resp. rate 17, height 5\' 6"  (1.676 m), weight 79.4 kg, SpO2 100 %. General:  Alert, well-nourished, well-developed male in no acute distress   NEURO:  Mental Status: AA&Ox3  Speech/Language: speech is without dysarthria or aphasia.    Cranial Nerves:  II: PERRL. Visual fields full.  III, IV, VI: EOMI. Eyelids elevate symmetrically.  V: Sensation is intact to light touch and symmetrical to face.  VII: Smile is symmetrical.   VIII: hearing intact to voice. IX, X:  Phonation is normal.  XII: tongue is midline without fasciculations. Motor: 5/5 strength to all muscle groups tested.  Sensation- Intact to light touch bilaterally. Extinction absent to light touch to DSS.   Coordination: FTN intact bilaterally, no drift.  Gait- deferred   Discharge Diet       Diet   Diet Heart Room service appropriate? Yes with Assist; Fluid consistency: Thin   liquids  DISCHARGE PLAN Disposition:  home aspirin 81 mg daily alone for secondary stroke prevention for 10 days then replace with Pradaxa alone. Ongoing stroke risk factor control by Primary Care Physician at time of discharge Follow-up PCP Alroy Dust, L.Marlou Sa, MD in 2 weeks. Follow-up in [facility] in 4 weeks, office to schedule an appointment.   35 minutes were spent preparing discharge.  [place] , MSN, AGACNP-BC [facility] See Amion for schedule and pager information 09/22/2021 2:11 PM   I  have personally obtained history,examined this patient, reviewed notes, independently viewed imaging studies, participated in medical decision making and plan of care.ROS completed by me personally and pertinent positives fully documented  I have made any additions or clarifications directly to the above note. Agree with note above.    Antony Contras, MD Medical Director [facility] Pager: [phone] 09/22/2021 2:54 PM

## 2021-09-22 NOTE — Care Management Important Message (Signed)
Important Message  Patient Details  Name: Tony Roberts MRN: [id number] Date of Birth: [birth year]   Medicare Important Message Given:  Yes     [name] 09/22/2021, 2:54 PM

## 2021-09-22 NOTE — TOC CAGE-AID Note (Signed)
Transition of Care [facility]) - CAGE-AID Screening   Patient Details  Name: Tony Roberts MRN: [id number] Date of Birth: 12-20-1940  Transition of Care [facility]) CM/SW Contact:    [place], [place] Phone Number: 09/22/2021, 9:42 AM   Clinical Narrative: Pt is unable to participate in Cage Aid.  Rene Gonsoulin Tarpley-Carter, MSW, LCSW-A Pronouns:  She/Her/Hers [place] Transitions of Care Clinical Social Worker Direct Number:  [phone] [email or web address]  CAGE-AID Screening: Substance Abuse Screening unable to be completed due to: : Patient unable to participate             Substance Abuse Education Offered: No

## 2021-09-22 NOTE — Progress Notes (Signed)
Patient ready for discharge to home; discharge instructions given and reviewed; Rx sent electronically.

## 2021-09-22 NOTE — TOC Benefit Eligibility Note (Signed)
Patient Advocate Encounter ° °Insurance verification completed.   ° °The patient is currently admitted and upon discharge could be taking dabigatran (Pradaxa) 150 mg. ° °The current 30 day co-pay is, $240.00 due to a $150.00 deductible.  Once deductible is met it will be $90.00 a month..  ° °The patient is insured through [employer] [place] Medicare Part D  ° ° ° °Jeffrey Smith, CPhT °Pharmacy Patient Advocate Specialist °[facility] Patient Advocate Team °Direct Number: [phone]  Fax: [phone] ° ° ° ° ° °  °

## 2021-09-22 NOTE — Progress Notes (Signed)
Lipid panel drawn prior to release; patient instructed and reviewed discharge medications with teach back; reviewed discharge appointments to make as instructed; patient discharged out via wheelchair accompanied home by his wife.

## 2021-09-23 ENCOUNTER — Other Ambulatory Visit (HOSPITAL_COMMUNITY): Payer: Self-pay

## 2021-09-24 ENCOUNTER — Other Ambulatory Visit (HOSPITAL_COMMUNITY): Payer: Self-pay

## 2021-09-24 ENCOUNTER — Telehealth: Payer: Self-pay | Admitting: Cardiology

## 2021-09-24 NOTE — Telephone Encounter (Signed)
Spoke with patient and made him an appointment with Dr. Radford Pax for 3/9.

## 2021-09-24 NOTE — Telephone Encounter (Signed)
Arsenio is calling wanting to know if he should be worked in for an appt due to being hospitalized and having a medication change. He is requesting a callback in regards to this.

## 2021-09-24 NOTE — Telephone Encounter (Signed)
Pt spoke with Dr. Theodosia Blender nurse.  Earlier appt scheduled.

## 2021-09-30 DIAGNOSIS — I679 Cerebrovascular disease, unspecified: Secondary | ICD-10-CM | POA: Diagnosis not present

## 2021-09-30 DIAGNOSIS — E7800 Pure hypercholesterolemia, unspecified: Secondary | ICD-10-CM | POA: Diagnosis not present

## 2021-09-30 DIAGNOSIS — I10 Essential (primary) hypertension: Secondary | ICD-10-CM | POA: Diagnosis not present

## 2021-09-30 DIAGNOSIS — K219 Gastro-esophageal reflux disease without esophagitis: Secondary | ICD-10-CM | POA: Diagnosis not present

## 2021-10-02 ENCOUNTER — Other Ambulatory Visit (HOSPITAL_COMMUNITY): Payer: Self-pay

## 2021-10-16 ENCOUNTER — Other Ambulatory Visit (HOSPITAL_COMMUNITY): Payer: Self-pay

## 2021-10-23 ENCOUNTER — Other Ambulatory Visit (HOSPITAL_COMMUNITY): Payer: Self-pay

## 2021-10-31 ENCOUNTER — Other Ambulatory Visit (HOSPITAL_COMMUNITY): Payer: Self-pay

## 2021-11-04 ENCOUNTER — Other Ambulatory Visit (HOSPITAL_COMMUNITY): Payer: Self-pay

## 2021-11-05 ENCOUNTER — Other Ambulatory Visit (HOSPITAL_COMMUNITY): Payer: Self-pay

## 2021-11-05 MED ORDER — AMIODARONE HCL 200 MG PO TABS
200.0000 mg | ORAL_TABLET | Freq: Every day | ORAL | 5 refills | Status: DC
  Filled 2021-11-05: qty 30, 30d supply, fill #0
  Filled 2021-12-01: qty 30, 30d supply, fill #1
  Filled 2021-12-29: qty 30, 30d supply, fill #2
  Filled 2022-02-02: qty 30, 30d supply, fill #3
  Filled 2022-03-04: qty 30, 30d supply, fill #4
  Filled 2022-04-06: qty 30, 30d supply, fill #5

## 2021-11-17 ENCOUNTER — Other Ambulatory Visit (HOSPITAL_COMMUNITY): Payer: Self-pay

## 2021-11-17 MED ORDER — ATORVASTATIN CALCIUM 20 MG PO TABS
20.0000 mg | ORAL_TABLET | Freq: Every day | ORAL | 5 refills | Status: DC
  Filled 2021-11-17: qty 30, 30d supply, fill #0
  Filled 2021-12-22: qty 30, 30d supply, fill #1
  Filled 2022-01-23: qty 30, 30d supply, fill #2
  Filled 2022-02-17: qty 30, 30d supply, fill #3
  Filled 2022-03-30: qty 30, 30d supply, fill #4
  Filled 2022-04-23: qty 30, 30d supply, fill #5

## 2021-11-19 ENCOUNTER — Ambulatory Visit: Payer: Medicare Other | Admitting: Physician Assistant

## 2021-11-19 ENCOUNTER — Encounter: Payer: Self-pay | Admitting: Physician Assistant

## 2021-11-19 ENCOUNTER — Other Ambulatory Visit: Payer: Self-pay

## 2021-11-19 VITALS — BP 122/68 | HR 63 | Ht 66.0 in | Wt 178.2 lb

## 2021-11-19 DIAGNOSIS — I610 Nontraumatic intracerebral hemorrhage in hemisphere, subcortical: Secondary | ICD-10-CM

## 2021-11-19 DIAGNOSIS — I480 Paroxysmal atrial fibrillation: Secondary | ICD-10-CM | POA: Diagnosis not present

## 2021-11-19 DIAGNOSIS — I428 Other cardiomyopathies: Secondary | ICD-10-CM | POA: Diagnosis not present

## 2021-11-19 DIAGNOSIS — E7849 Other hyperlipidemia: Secondary | ICD-10-CM

## 2021-11-19 NOTE — Patient Instructions (Signed)
Medication Instructions:  ?.Your physician recommends that you continue on your current medications as directed. Please refer to the Current Medication list given to you today. ? ? ?*If you need a refill on your cardiac medications before your next appointment, please call your pharmacy* ? ? ?Lab Work: ?NONE TODAY BUT COME FASTING WHEN YOU SEE DR TURNER WE WILL CHECK YOUR CHOLESTEROL ? ?If you have labs (blood work) drawn today and your tests are completely normal, you will receive your results only by: ?MyChart Message (if you have MyChart) OR ?A paper copy in the mail ?If you have any lab test that is abnormal or we need to change your treatment, we will call you to review the results. ? ? ?Testing/Procedures: ?None ordered ? ? ?Follow-Up: ?At [facility], you and your health needs are our priority.  As part of our continuing mission to provide you with exceptional heart care, we have created designated Provider Care Teams.  These Care Teams include your primary Cardiologist (physician) and Advanced Practice Providers (APPs -  Physician Assistants and Nurse Practitioners) who all work together to provide you with the care you need, when you need it. ? ?We recommend signing up for the patient portal called "MyChart".  Sign up information is provided on this After Visit Summary.  MyChart is used to connect with patients for Virtual Visits (Telemedicine).  Patients are able to view lab/test results, encounter notes, upcoming appointments, etc.  Non-urgent messages can be sent to your provider as well.   ?To learn more about what you can do with MyChart, go to [email or web address].   ? ?Your next appointment:   ?As scheduled   ? ?The format for your next appointment:   ?In Person ? ?Provider:   ?Fransico Him, MD   ? ? ?Other Instructions ? ? ?

## 2021-11-19 NOTE — Progress Notes (Signed)
Cardiology Office Note    Date:  11/19/2021   ID:  Tony Roberts, DOB 1941-07-23, MRN [id number]   PCP:  Aurea Graff.Marlou Sa, [facility]  Cardiologist:  Fransico Him, MD   Advanced Practice Provider:  No care team member to display Electrophysiologist:  None   [phone]   Chief Complaint  Patient presents with   Hospitalization Follow-up    History of Present Illness:  Tony Roberts is a 81 y.o. male with history of HTN, GERD, asthma, 3 TIA's  each lasting 30 min with one-sided numbness and facial droop with speech problems and spontaneously resolved.  MRI done 11/15 /21 showed no acute CVA but small chronic infarct in the left cerebellum. Admitted 08/2020 with Afib with RVR  He underwent TEE/DCCV to NSR and transitioned to PO Amio 200mg  daily.  He was continued on Toprol and started on Eliquis 5mg  BID.  2D echo 08/22/2020 showed severe LV dysfunction with EF 25-30% felt to possibly be due to tachy mediated DCM with  LVF with severe LAE and mild AR by TTE.  TEE showed a small PFO with L>R shunting and moderate AR. admitted with Afib with RVR 08/2021  Was complaining of chest pain and a lexiscan myoview 02/2021 showed no ischemia. His Toprol was decreased to 25mg  daily due to bradycardia. 2D echo showed normalization of LVF with EF 60-65%, severe LAE and mild MR/AR and dilated ascending aorta at 25mm. cMRI 02/2021 showed normal LVF with EF 60%, mild ASSH, normal RV, mild AR and MR and mildly dilated ascending aorta at 42mm.   Patient was admitted 09/2021 right thalamic IPH.  Eliquis was reversed with Andexxa.  MRI revealed a few punctate ischemic infarcts in left temporal lobe and left basal ganglia.  Cleviprex was used initially to control blood pressure, but this has been weaned off.  Due to presence of ischemic strokes, patient was initially on ASA 81 mg daily  then switched to Pradaxa 14 days after his IPH per Dr. [name].     Patient comes in for f/u. Says he  recovered quickly.Denies chest pain, palpitations, edema. Has some fatigue and naps in the afternoon. Still bowling but can't walk with back pain.    Past Medical History:  Diagnosis Date   Acquired dilation of ascending aorta and aortic root ([place])    82mm by echo 2022 and 2mm by cardiac MRI 03/2021   Aortic insufficiency    mild by cardiac MRI 03/2021   Arthritis    Asthma    last asthma attack at age 26   Dilated cardiomyopathy ([place])    likely tachy mediated from atrial fibrillation.  LVF normalized on echo 01/2021   GERD (gastroesophageal reflux disease)    Headache    Mitral regurgitation    mild by cardiac MRI 03/2021   Other specified iron deficiency anemias    PAC (premature atrial contraction)    occasional PAC's   PAF (paroxysmal atrial fibrillation) ([place])    s/p TEE/DCCV 08/2020   TIA (transient ischemic attack)    3 TIAs in last 1 month.  Each episode last about 30 minutes, symptoms was one-sided numbness and facial droop and speech problems. MRI 08/2020 showed A tiny chronic infarct     Past Surgical History:  Procedure Laterality Date   CARDIOVERSION N/A 08/27/2020   Procedure: CARDIOVERSION;  Surgeon: Dorothy Spark, MD;  Location: [place];  Service: Cardiovascular;  Laterality: N/A;   CATARACT EXTRACTION  HERNIA REPAIR     SHOULDER CLOSED REDUCTION Left 08/23/2020   Procedure: CLOSED REDUCTION SHOULDER;  Surgeon: [name], MD;  Location: [place];  Service: Orthopedics;  Laterality: Left;   SPINE SURGERY  10/29/2016   TEE WITHOUT CARDIOVERSION N/A 08/27/2020   Procedure: TRANSESOPHAGEAL ECHOCARDIOGRAM (TEE);  Surgeon: Dorothy Spark, MD;  Location: [facility] ENDOSCOPY;  Service: Cardiovascular;  Laterality: N/A;   TONSILLECTOMY      Current Medications: Current Meds  Medication Sig   acetaminophen (TYLENOL) 325 MG tablet Take 2 tablets (650 mg total) by mouth every 4 (four) hours as needed for mild pain (or temp > 37.5 C (99.5 F)).   albuterol  (PROVENTIL HFA) 108 (90 Base) MCG/ACT inhaler Inhale two puffs every four to six hours as needed for cough or wheeze. (Patient taking differently: 2 puffs every 6 (six) hours as needed for wheezing or shortness of breath.)   amiodarone (PACERONE) 200 MG tablet Take 1 tablet (200 mg total) by mouth daily.   amLODipine (NORVASC) 5 MG tablet Take 1 tablet (5 mg total) by mouth daily.   atorvastatin (LIPITOR) 20 MG tablet Take 1 tablet (20 mg total) by mouth daily.   dabigatran (PRADAXA) 150 MG CAPS capsule Take 1 capsule (150 mg total) by mouth every 12 (twelve) hours.   esomeprazole (NEXIUM) 40 MG capsule Take 1 capsule (40 mg total) by mouth daily.   famotidine (PEPCID) 40 MG tablet Take 1 tablet (40 mg total) by mouth at bedtime. (Patient taking differently: Take 40 mg by mouth at bedtime.)   Fluticasone Propionate, Inhal, (FLOVENT DISKUS) 250 MCG/ACT AEPB Inhale 1 puff into the lungs in the morning and at bedtime.   levothyroxine (SYNTHROID) 50 MCG tablet Take 1 tablet (50 mcg total) by mouth in the morning on an empty stomach   losartan (COZAAR) 25 MG tablet Take 0.5 tablets (12.5 mg total) by mouth daily.   montelukast (SINGULAIR) 10 MG tablet Take 1 tablet (10 mg total) by mouth at bedtime.   omeprazole (PRILOSEC) 40 MG capsule Take 40 mg by mouth daily.   potassium chloride SA (KLOR-CON M) 20 MEQ tablet Take 1 tablet (20 mEq total) by mouth daily.   triamcinolone (NASACORT) 55 MCG/ACT AERO nasal inhaler Place 2 sprays into the nose daily as needed (for allergies or rhinitis).     Allergies:   Eliquis [apixaban] and Lisinopril   Social History   Socioeconomic History   Marital status: Married    Spouse name: Not on file   Number of children: Not on file   Years of education: Not on file   Highest education level: Not on file  Occupational History   Not on file  Tobacco Use   Smoking status: Never    Passive exposure: Past   Smokeless tobacco: Never  Vaping Use   Vaping Use: Never  used  Substance and Sexual Activity   Alcohol use: No   Drug use: No   Sexual activity: Not on file  Other Topics Concern   Not on file  Social History Narrative   Not on file   Social Determinants of Health   Financial Resource Strain: Not on file  Food Insecurity: Not on file  Transportation Needs: Not on file  Physical Activity: Not on file  Stress: Not on file  Social Connections: Not on file     Family History:  The patient's  family history includes Alzheimer's disease in his mother; Asthma in his father.   ROS:  Please see the history of present illness.    ROS All other systems reviewed and are negative.   PHYSICAL EXAM:   VS:  BP 122/68    Pulse 63    Ht 5\' 6"  (1.676 m)    Wt 178 lb 3.2 oz (80.8 kg)    SpO2 97%    BMI 28.76 kg/m   Physical Exam  GEN: Well nourished, well developed, in no acute distress  Neck: no JVD, carotid bruits, or masses Cardiac:RRR; no murmurs, rubs, or gallops  Respiratory:  clear to auscultation bilaterally, normal work of breathing GI: soft, nontender, nondistended, + BS Ext: without cyanosis, clubbing, or edema, Good distal pulses bilaterally Neuro:  Alert and Oriented x 3 Psych: euthymic mood, full affect  Wt Readings from Last 3 Encounters:  11/19/21 178 lb 3.2 oz (80.8 kg)  09/19/21 175 lb (79.4 kg)  07/03/21 183 lb 12.8 oz (83.4 kg)      Studies/Labs Reviewed:   EKG:  EKG is not ordered today.     Recent Labs: 07/03/2021: TSH 1.160 09/19/2021: Magnesium 2.2 09/20/2021: ALT 11; BUN 9; Creatinine, Ser 1.14; Hemoglobin 11.8; Platelets 143; Potassium 3.8; Sodium 136   Lipid Panel    Component Value Date/Time   CHOL 254 (H) 09/22/2021 1457   TRIG 123 09/22/2021 1457   HDL 37 (L) 09/22/2021 1457   CHOLHDL 6.9 09/22/2021 1457   VLDL 25 09/22/2021 1457   LDLCALC 192 (H) 09/22/2021 1457    Additional studies/ records that were reviewed today include:    TEE 08/27/2020 IMPRESSIONS    1. Left ventricular ejection  fraction, by estimation, is 20 to 25%. The  left ventricle has severely decreased function. The left ventricle  demonstrates global hypokinesis. The left ventricular internal cavity size  was mildly dilated. Left ventricular  diastolic function could not be evaluated.   2. Right ventricular systolic function is normal. The right ventricular  size is normal.   3. Left atrial size was severely dilated. No left atrial/left atrial  appendage thrombus was detected.   4. Right atrial size was severely dilated.   5. The mitral valve is degenerative. Mild to moderate mitral valve  regurgitation. No evidence of mitral stenosis.   6. The aortic valve is tricuspid. Aortic valve regurgitation is moderate.  No aortic stenosis is present.   7. The inferior vena cava is normal in size with greater than 50%  respiratory variability, suggesting right atrial pressure of 3 mmHg.   8. Evidence of atrial level shunting detected by color flow Doppler.  There is a small patent foramen ovale with predominantly left to right  shunting across the atrial septum.    2D echo 08/22/2020 IMPRESSIONS    1. Left ventricular ejection fraction, by estimation, is 25 to 30%. The  left ventricle has severely decreased function. The left ventricle  demonstrates global hypokinesis. There is moderate left ventricular  hypertrophy of the basal-septal segment. Left  ventricular diastolic function could not be evaluated.   2. Right ventricular systolic function is normal. The right ventricular  size is normal. Tricuspid regurgitation signal is inadequate for assessing  PA pressure.   3. Left atrial size was severely dilated.   4. Right atrial size was mildly dilated.   5. The mitral valve is normal in structure. Mild mitral valve  regurgitation. No evidence of mitral stenosis.   6. The aortic valve is tricuspid. Aortic valve regurgitation is mild.  Mild aortic valve sclerosis is present, with no evidence of  aortic valve   stenosis.   7. Aortic dilatation noted. There is mild dilatation of the ascending  aorta, measuring 40 mm.   8. The inferior vena cava is normal in size with greater than 50%  respiratory variability, suggesting right atrial pressure of 3 mmHg.     Risk Assessment/Calculations:    CHA2DS2-VASc Score = 6   This indicates a 9.7% annual risk of stroke. The patient's score is based upon: CHF History: 1 HTN History: 1 Diabetes History: 0 Stroke History: 2 Vascular Disease History: 0 Age Score: 2 Gender Score: 0        ASSESSMENT:    1. Nontraumatic subcortical hemorrhage of cerebral hemisphere, unspecified laterality (HCC)   2. Paroxysmal atrial fibrillation (HCC)   3. Other hyperlipidemia   4. NICM (nonischemic cardiomyopathy) ([place])      PLAN:  In order of problems listed above:  Right dominant IPH 08/2021 Eliquis reversed with Andexxa and is now on Pradaxa per Dr. [name] previous ischemic strokes and TIAs-recovered very well without residual  Paroxysmal atrial fibrillation status post TEE/DCCV 07/2020 now on Pradaxa amiodarone.  Not on beta-blocker because of previous bradycardia.  Labs stable in the hospital so will not repeat  HLD 192 started on lipitor 20 mg -repeat in April  History of dilated cardiomyopathy felt tachycardia related resolved most recent echo 08/27/2020 LVEF 60 to 65%  Shared Decision Making/Informed Consent        Medication Adjustments/Labs and Tests Ordered: Current medicines are reviewed at length with the patient today.  Concerns regarding medicines are outlined above.  Medication changes, Labs and Tests ordered today are listed in the Patient Instructions below. Patient Instructions  Medication Instructions:  .Your physician recommends that you continue on your current medications as directed. Please refer to the Current Medication list given to you today.   *If you need a refill on your cardiac medications before your next appointment,  please call your pharmacy*   Lab Work: NONE TODAY BUT COME FASTING WHEN YOU SEE DR TURNER WE WILL CHECK YOUR CHOLESTEROL  If you have labs (blood work) drawn today and your tests are completely normal, you will receive your results only by: MyChart Message (if you have MyChart) OR A paper copy in the mail If you have any lab test that is abnormal or we need to change your treatment, we will call you to review the results.   Testing/Procedures: None ordered   Follow-Up: At [facility], you and your health needs are our priority.  As part of our continuing mission to provide you with exceptional heart care, we have created designated Provider Care Teams.  These Care Teams include your primary Cardiologist (physician) and Advanced Practice Providers (APPs -  Physician Assistants and Nurse Practitioners) who all work together to provide you with the care you need, when you need it.  We recommend signing up for the patient portal called "MyChart".  Sign up information is provided on this After Visit Summary.  MyChart is used to connect with patients for Virtual Visits (Telemedicine).  Patients are able to view lab/test results, encounter notes, upcoming appointments, etc.  Non-urgent messages can be sent to your provider as well.   To learn more about what you can do with MyChart, go to [email or web address].    Your next appointment:   As scheduled    The format for your next appointment:   In Person  Provider:   Fransico Him, MD     Other Instructions  Sumner Boast, PA-C  11/19/2021 2:24 PM    [facility] [address], [place], [place]  [address] Phone: [phone]; Fax: [phone]

## 2021-11-24 ENCOUNTER — Ambulatory Visit: Payer: Medicare Other | Admitting: Neurology

## 2021-11-24 ENCOUNTER — Encounter: Payer: Self-pay | Admitting: Neurology

## 2021-11-24 ENCOUNTER — Other Ambulatory Visit: Payer: Self-pay

## 2021-11-24 VITALS — BP 136/71 | HR 58 | Ht 66.0 in | Wt 178.0 lb

## 2021-11-24 DIAGNOSIS — I610 Nontraumatic intracerebral hemorrhage in hemisphere, subcortical: Secondary | ICD-10-CM

## 2021-11-24 DIAGNOSIS — I639 Cerebral infarction, unspecified: Secondary | ICD-10-CM

## 2021-11-24 DIAGNOSIS — I482 Chronic atrial fibrillation, unspecified: Secondary | ICD-10-CM | POA: Diagnosis not present

## 2021-11-24 NOTE — Progress Notes (Signed)
[facility] [address] [place]. [place] [address] [phone]       OFFICE FOLLOW-UP NOTE  Mr. Tony Roberts Date of Birth:  11/02/1940 Medical Record Number:  [id number]   HPI: Tony Roberts is a 81 year old Caucasian male seen today for initial office follow-up visit following hospital admission for intracerebral hemorrhage in December 2022.  History is obtained from the Tony Roberts and review of electronic medical records and opossum reviewed pertinent available imaging films in PACS.  Tony Roberts presented on 09/19/2021 with sudden onset of left arm and leg numbness and worsening of existing left leg numbness.  On admission CT scan of the head showed a right thalamic hemorrhage with mild cytotoxic edema but no midline shift intraventricular hemorrhage or hydrocephalus.  Cleviprex was initially used to control his blood pressure but was easily weaned off in a day or 2.  MRI scan of the brain also showed ischemic punctate infarcts involving left temporal lobe and left basal ganglia and stable appearance of the right thalamic hemorrhage with mild cytotoxic  edema.  Eliquis was reversed with Andexxa.  Aspirin and aspirin was started after few days with plan to switch from aspirin to Pradaxa after 10 days.  Tony Roberts did well and had no physical deficits except for subjective paresthesias on the left and had no therapy needs at discharge.  Tony Roberts states is done well since discharge.  Tony Roberts is changed from aspirin to Pradaxa which is tolerating well without major bleeding but does get easy bruising.  Tony Roberts has noticed that his walking has been a little bit slow and his balance is off at times and Tony Roberts has to rest and then feels all right.  His left leg continues to have numbness and bothersome.  Tony Roberts does have history of chronic low back pain and had undergone back surgery about 5 years ago Tony Roberts has had some residual leg numbness since then.  Tony Roberts feels his symptoms are not bothersome enough at the present time to seek  any further evaluation.  Tony Roberts has no new complaints today.  Blood pressures well controlled today it is 136/71.  Tony Roberts is tolerating Lipitor well without muscle aches and pains.  ROS:   14 system review of systems is positive for left leg and hand numbness all other systems negative  PMH:  Past Medical History:  Diagnosis Date   Acquired dilation of ascending aorta and aortic root (HCC)    59m by echo 2022 and 429mby cardiac MRI 03/2021   Aortic insufficiency    mild by cardiac MRI 03/2021   Arthritis    Asthma    last asthma attack at age 81 [place] [facility] [place]   likely tachy mediated from atrial fibrillation.  LVF normalized on echo 01/2021   GERD (gastroesophageal reflux disease)    Headache    Mitral regurgitation    mild by cardiac MRI 03/2021   Other specified iron deficiency anemias    PAC (premature atrial contraction)    occasional PAC's   PA[facility] ([facility]oxysmal [place]) [place]   s/p TEE/DCCV 08/2020   TIA (transient ischemic attack)    3 TIAs in last 1 month.  Each episode last about 30 minutes, symptoms was one-sided numbness and facial droop and speech problems. MRI 08/2020 showed A tiny chronic infarct     Social History:  Social History   Socioeconomic History   Marital status: Married    Spouse name: Not on file   Number of children: Not  on file   Years of education: Not on file   Highest education level: Not on file  Occupational History   Not on file  Tobacco Use   Smoking status: Never    Passive exposure: Past   Smokeless tobacco: Never  Vaping Use   Vaping Use: Never used  Substance and Sexual Activity   Alcohol use: No   Drug use: No   Sexual activity: Not on file  Other Topics Concern   Not on file  Social History Narrative   Not on file   Social Determinants of Health   Financial Resource Strain: Not on file  Food Insecurity: Not on file  Transportation Needs: Not on file  Physical Activity: Not on file  Stress: Not on  file  Social Connections: Not on file  Intimate Partner Violence: Not on file    Medications:   Current Outpatient Medications on File Prior to Visit  Medication Sig Dispense Refill   acetaminophen (TYLENOL) 325 MG tablet Take 2 tablets (650 mg total) by mouth every 4 (four) hours as needed for mild pain (or temp > 37.5 C (99.5 F)). 30 tablet 0   albuterol (PROVENTIL HFA) 108 (90 Base) MCG/ACT inhaler Inhale two puffs every four to six hours as needed for cough or wheeze. (Tony Roberts taking differently: 2 puffs every 6 (six) hours as needed for wheezing or shortness of breath.) 1 Inhaler 1   amiodarone (PACERONE) 200 MG tablet Take 1 tablet (200 mg total) by mouth daily. 30 tablet 5   amLODipine (NORVASC) 5 MG tablet Take 1 tablet (5 mg total) by mouth daily. 30 tablet 0   atorvastatin (LIPITOR) 20 MG tablet Take 1 tablet (20 mg total) by mouth daily. 30 tablet 5   dabigatran (PRADAXA) 150 MG CAPS capsule Take 1 capsule (150 mg total) by mouth every 12 (twelve) hours. 60 capsule 1   esomeprazole (NEXIUM) 40 MG capsule Take 1 capsule (40 mg total) by mouth daily. 30 capsule 0   famotidine (PEPCID) 40 MG tablet Take 1 tablet (40 mg total) by mouth at bedtime. (Tony Roberts taking differently: Take 40 mg by mouth at bedtime.) 90 tablet 1   Fluticasone Propionate, Inhal, (FLOVENT DISKUS) 250 MCG/ACT AEPB Inhale 1 puff into the lungs in the morning and at bedtime. 60 each 4   levothyroxine (SYNTHROID) 50 MCG tablet Take 1 tablet (50 mcg total) by mouth in the morning on an empty stomach 30 tablet 12   losartan (COZAAR) 25 MG tablet Take 0.5 tablets (12.5 mg total) by mouth daily. 45 tablet 1   montelukast (SINGULAIR) 10 MG tablet Take 1 tablet (10 mg total) by mouth at bedtime. 90 tablet 1   omeprazole (PRILOSEC) 40 MG capsule Take 40 mg by mouth daily.     potassium chloride SA (KLOR-CON M) 20 MEQ tablet Take 1 tablet (20 mEq total) by mouth daily. 90 tablet 3   triamcinolone (NASACORT) 55 MCG/ACT AERO  nasal inhaler Place 2 sprays into the nose daily as needed (for allergies or rhinitis). 16.5 g 5   No current facility-administered medications on file prior to visit.    Allergies:   Allergies  Allergen Reactions   Eliquis [Apixaban] Other (See Comments)    Eliquis failure: stroke on Eliquis   Lisinopril     Other reaction(s): headache    Physical Exam General: well developed, well nourished elderly Caucasian male, seated, in no evident distress Head: head normocephalic and atraumatic.  Neck: supple with no carotid or supraclavicular  bruits Cardiovascular: regular rate and rhythm, no murmurs Musculoskeletal: no deformity Skin:  no rash/petichiae Vascular:  Normal pulses all extremities Vitals:   11/24/21 1041  BP: 136/71  Pulse: (!) 58   Neurologic Exam Mental Status: Awake and fully alert. Oriented to place and time. Recent and remote memory intact. Attention span, concentration and fund of knowledge appropriate. Mood and affect appropriate.  Cranial Nerves: Fundoscopic exam reveals sharp disc margins. Pupils equal, briskly reactive to light. Extraocular movements full without nystagmus. Visual fields full to confrontation. Hearing intact. Facial sensation intact. Face, tongue, palate moves normally and symmetrically.  Motor: Normal bulk and tone. Normal strength in all tested extremity muscles.  Slightly diminished fine finger movements on the left compared to the right. Sensory.: intact to touch ,pinprick .position and vibratory sensation.  Coordination: Rapid alternating movements normal in all extremities. Finger-to-nose and heel-to-shin performed accurately bilaterally. Gait and Station: Arises from chair without difficulty. Stance is normal. Gait demonstrates slight dragging of the left leg but fair balance.. Able to heel, toe and tandem walk with moderate difficulty.  Reflexes: 1+ and symmetric. Toes downgoing.   NIHSS  0 Modified Rankin  1   ASSESSMENT: 81 year old  Caucasian male with a right thalamic parenchymal intracerebral hemorrhage and December 2022 secondary to Eliquis usage for atrial fibrillation along with small punctate ischemic and infarcts in the left  temporal lobe and basal ganglia of cardioembolic etiology from A-fib.  Eliquis effect was reversed with Andexxa.  Tony Roberts is doing remarkably well with practically no residual deficits and has been switched to Pradaxa     PLAN: I had a long d/w Tony Roberts about his recent hemorrhagic right stroke related to anticoagulation with Eliquis for his chronic A-fib, risk for recurrent stroke/TIAs, personally independently reviewed imaging studies and stroke evaluation results and answered questions.Continue Pradaxa (dabigatran) 150 mg twice a day  for secondary stroke prevention and maintain strict control of hypertension with blood pressure goal below 130/90, diabetes with hemoglobin A1c goal below 6.5% and lipids with LDL cholesterol goal below 70 mg/dL. I also advised the Tony Roberts to eat a healthy diet with plenty of whole grains, cereals, fruits and vegetables, exercise regularly and maintain ideal body weight .Tony Roberts was advised to follow-up with his orthopedic surgeon Dr. Jacelyn Grip for his chronic back pain and left leg numbness and gait difficulties to get worse.  Followup in the future with my nurse practitioner Janett Billow in 3 months or call earlier if necessary. Greater than 50% of time during this 35 minute visit was spent on counseling,explanation of diagnosis, planning of further management, discussion with Tony Roberts and family and coordination of care Antony Contras, MD Note: This document was prepared with digital dictation and possible smart phrase technology. Any transcriptional errors that result from this process are unintentional

## 2021-11-24 NOTE — Patient Instructions (Signed)
I had a long d/w patient about his recent hemorrhagic right stroke related to anticoagulation with Eliquis for his chronic A-fib, risk for recurrent stroke/TIAs, personally independently reviewed imaging studies and stroke evaluation results and answered questions.Continue Pradaxa (dabigatran) 150 mg twice a day  for secondary stroke prevention and maintain strict control of hypertension with blood pressure goal below 130/90, diabetes with hemoglobin A1c goal below 6.5% and lipids with LDL cholesterol goal below 70 mg/dL. I also advised the patient to eat a healthy diet with plenty of whole grains, cereals, fruits and vegetables, exercise regularly and maintain ideal body weight .he was advised to follow-up with his orthopedic surgeon Dr. Jacelyn Grip for his chronic back pain and left leg numbness and gait difficulties to get worse.  Followup in the future with my nurse practitioner Janett Billow in 3 months or call earlier if necessary. Stroke Prevention Some medical conditions and behaviors can lead to a higher chance of having a stroke. You can help prevent a stroke by eating healthy, exercising, not smoking, and managing any medical conditions you have. Stroke is a leading cause of functional impairment. Primary prevention is particularly important because a majority of strokes are first-time events. Stroke changes the lives of not only those who experience a stroke but also their family and other caregivers. How can this condition affect me? A stroke is a medical emergency and should be treated right away. A stroke can lead to brain damage and can sometimes be life-threatening. If a person gets medical treatment right away, there is a better chance of surviving and recovering from a stroke. What can increase my risk? The following medical conditions may increase your risk of a stroke: Cardiovascular disease. High blood pressure (hypertension). Diabetes. High cholesterol. Sickle cell disease. Blood clotting  disorders (hypercoagulable state). Obesity. Sleep disorders (obstructive sleep apnea). Other risk factors include: Being older than age 109. Having a history of blood clots, stroke, or mini-stroke (transient ischemic attack, TIA). Genetic factors, such as race, ethnicity, or a family history of stroke. Smoking cigarettes or using other tobacco products. Taking birth control pills, especially if you also use tobacco. Heavy use of alcohol or drugs, especially cocaine and methamphetamine. Physical inactivity. What actions can I take to prevent this? Manage your health conditions High cholesterol levels. Eating a healthy diet is important for preventing high cholesterol. If cholesterol cannot be managed through diet alone, you may need to take medicines. Take any prescribed medicines to control your cholesterol as told by your health care provider. Hypertension. To reduce your risk of stroke, try to keep your blood pressure below 130/80. Eating a healthy diet and exercising regularly are important for controlling blood pressure. If these steps are not enough to manage your blood pressure, you may need to take medicines. Take any prescribed medicines to control hypertension as told by your health care provider. Ask your health care provider if you should monitor your blood pressure at home. Have your blood pressure checked every year, even if your blood pressure is normal. Blood pressure increases with age and some medical conditions. Diabetes. Eating a healthy diet and exercising regularly are important parts of managing your blood sugar (glucose). If your blood sugar cannot be managed through diet and exercise, you may need to take medicines. Take any prescribed medicines to control your diabetes as told by your health care provider. Get evaluated for obstructive sleep apnea. Talk to your health care provider about getting a sleep evaluation if you snore a lot or have excessive  sleepiness. Make  sure that any other medical conditions you have, such as atrial fibrillation or atherosclerosis, are managed. Nutrition Follow instructions from your health care provider about what to eat or drink to help manage your health condition. These instructions may include: Reducing your daily calorie intake. Limiting how much salt (sodium) you use to 1,500 milligrams (mg) each day. Using only healthy fats for cooking, such as olive oil, canola oil, or sunflower oil. Eating healthy foods. You can do this by: Choosing foods that are high in fiber, such as whole grains, and fresh fruits and vegetables. Eating at least 5 servings of fruits and vegetables a day. Try to fill one-half of your plate with fruits and vegetables at each meal. Choosing lean protein foods, such as lean cuts of meat, poultry without skin, fish, tofu, beans, and nuts. Eating low-fat dairy products. Avoiding foods that are high in sodium. This can help lower blood pressure. Avoiding foods that have saturated fat, trans fat, and cholesterol. This can help prevent high cholesterol. Avoiding processed and prepared foods. Counting your daily carbohydrate intake.  Lifestyle If you drink alcohol: Limit how much you have to: 0-1 drink a day for women who are not pregnant. 0-2 drinks a day for men. Know how much alcohol is in your drink. In the [place] one drink equals one 12 oz bottle of beer (320m), one 5 oz glass of wine (1431m, or one 1 oz glass of hard liquor (4417m Do not use any products that contain nicotine or tobacco. These products include cigarettes, chewing tobacco, and vaping devices, such as e-cigarettes. If you need help quitting, ask your health care provider. Avoid secondhand smoke. Do not use drugs. Activity  Try to stay at a healthy weight. Get at least 30 minutes of exercise on most days, such as: Fast walking. Biking. Swimming. Medicines Take over-the-counter and prescription medicines only as told by your  health care provider. Aspirin or blood thinners (antiplatelets or anticoagulants) may be recommended to reduce your risk of forming blood clots that can lead to stroke. Avoid taking birth control pills. Talk to your health care provider about the risks of taking birth control pills if: You are over 35 50ars old. You smoke. You get very bad headaches. You have had a blood clot. Where to find more information American Stroke Association: www.strokeassociation.org Get help right away if: You or a loved one has any symptoms of a stroke. "BE FAST" is an easy way to remember the main warning signs of a stroke: B - Balance. Signs are dizziness, sudden trouble walking, or loss of balance. E - Eyes. Signs are trouble seeing or a sudden change in vision. F - Face. Signs are sudden weakness or numbness of the face, or the face or eyelid drooping on one side. A - Arms. Signs are weakness or numbness in an arm. This happens suddenly and usually on one side of the body. S - Speech. Signs are sudden trouble speaking, slurred speech, or trouble understanding what people say. T - Time. Time to call emergency services. Write down what time symptoms started. You or a loved one has other signs of a stroke, such as: A sudden, severe headache with no known cause. Nausea or vomiting. Seizure. These symptoms may represent a serious problem that is an emergency. Do not wait to see if the symptoms will go away. Get medical help right away. Call your local emergency services (911 in the [place] Do not drive yourself to the hospital. Summary  You can help to prevent a stroke by eating healthy, exercising, not smoking, limiting alcohol intake, and managing any medical conditions you may have. Do not use any products that contain nicotine or tobacco. These include cigarettes, chewing tobacco, and vaping devices, such as e-cigarettes. If you need help quitting, ask your health care provider. Remember "BE FAST" for warning  signs of a stroke. Get help right away if you or a loved one has any of these signs. This information is not intended to replace advice given to you by your health care provider. Make sure you discuss any questions you have with your health care provider. Document Revised: 04/08/2020 Document Reviewed: 04/08/2020 Elsevier Patient Education  [place].

## 2021-11-26 ENCOUNTER — Other Ambulatory Visit (HOSPITAL_COMMUNITY): Payer: Self-pay

## 2021-11-27 ENCOUNTER — Ambulatory Visit: Payer: Medicare Other | Admitting: Cardiology

## 2021-12-01 ENCOUNTER — Other Ambulatory Visit (HOSPITAL_COMMUNITY): Payer: Self-pay

## 2021-12-01 ENCOUNTER — Other Ambulatory Visit: Payer: Self-pay | Admitting: Neurology

## 2021-12-01 ENCOUNTER — Other Ambulatory Visit: Payer: Self-pay

## 2021-12-02 ENCOUNTER — Other Ambulatory Visit (HOSPITAL_COMMUNITY): Payer: Self-pay

## 2021-12-02 MED ORDER — DABIGATRAN ETEXILATE MESYLATE 150 MG PO CAPS
150.0000 mg | ORAL_CAPSULE | Freq: Two times a day (BID) | ORAL | 3 refills | Status: DC
  Filled 2021-12-02: qty 60, 30d supply, fill #0
  Filled 2022-01-01: qty 60, 30d supply, fill #1
  Filled 2022-02-02: qty 60, 30d supply, fill #2
  Filled 2022-02-26: qty 60, 30d supply, fill #3

## 2021-12-03 ENCOUNTER — Other Ambulatory Visit (HOSPITAL_COMMUNITY): Payer: Self-pay

## 2021-12-03 ENCOUNTER — Other Ambulatory Visit: Payer: Self-pay | Admitting: Allergy and Immunology

## 2021-12-03 NOTE — Telephone Encounter (Signed)
Is this ok to refill for the patient?  ?

## 2021-12-04 ENCOUNTER — Other Ambulatory Visit (HOSPITAL_COMMUNITY): Payer: Self-pay

## 2021-12-05 ENCOUNTER — Other Ambulatory Visit: Payer: Self-pay | Admitting: Allergy and Immunology

## 2021-12-05 ENCOUNTER — Other Ambulatory Visit (HOSPITAL_COMMUNITY): Payer: Self-pay

## 2021-12-22 ENCOUNTER — Other Ambulatory Visit (HOSPITAL_COMMUNITY): Payer: Self-pay

## 2021-12-29 ENCOUNTER — Other Ambulatory Visit: Payer: Self-pay | Admitting: *Deleted

## 2021-12-29 ENCOUNTER — Other Ambulatory Visit (HOSPITAL_COMMUNITY): Payer: Self-pay

## 2021-12-29 ENCOUNTER — Other Ambulatory Visit: Payer: Self-pay | Admitting: Allergy and Immunology

## 2021-12-29 MED ORDER — ESOMEPRAZOLE MAGNESIUM 40 MG PO CPDR
40.0000 mg | DELAYED_RELEASE_CAPSULE | Freq: Every day | ORAL | 5 refills | Status: DC
  Filled 2021-12-29: qty 30, 30d supply, fill #0

## 2021-12-29 MED ORDER — AMLODIPINE BESYLATE 5 MG PO TABS
5.0000 mg | ORAL_TABLET | Freq: Every day | ORAL | 1 refills | Status: DC
  Filled 2021-12-29: qty 90, 90d supply, fill #0
  Filled 2022-03-30: qty 90, 90d supply, fill #1

## 2022-01-01 ENCOUNTER — Other Ambulatory Visit (HOSPITAL_COMMUNITY): Payer: Self-pay

## 2022-01-12 ENCOUNTER — Ambulatory Visit: Payer: Medicare Other | Admitting: Cardiology

## 2022-01-20 ENCOUNTER — Other Ambulatory Visit (HOSPITAL_COMMUNITY): Payer: Self-pay

## 2022-01-20 ENCOUNTER — Ambulatory Visit: Payer: Medicare Other | Admitting: Allergy and Immunology

## 2022-01-20 ENCOUNTER — Encounter: Payer: Self-pay | Admitting: Allergy and Immunology

## 2022-01-20 VITALS — BP 112/68 | HR 63 | Temp 98.1°F | Resp 18 | Ht 66.0 in | Wt 176.8 lb

## 2022-01-20 DIAGNOSIS — J4540 Moderate persistent asthma, uncomplicated: Secondary | ICD-10-CM | POA: Diagnosis not present

## 2022-01-20 DIAGNOSIS — K219 Gastro-esophageal reflux disease without esophagitis: Secondary | ICD-10-CM | POA: Diagnosis not present

## 2022-01-20 DIAGNOSIS — J3089 Other allergic rhinitis: Secondary | ICD-10-CM | POA: Diagnosis not present

## 2022-01-20 MED ORDER — FAMOTIDINE 40 MG PO TABS
40.0000 mg | ORAL_TABLET | Freq: Every evening | ORAL | 1 refills | Status: AC
  Filled 2022-01-20 – 2022-03-30 (×2): qty 90, 90d supply, fill #0

## 2022-01-20 MED ORDER — FLOVENT DISKUS 250 MCG/ACT IN AEPB
1.0000 | INHALATION_SPRAY | Freq: Two times a day (BID) | RESPIRATORY_TRACT | 5 refills | Status: AC
  Filled 2022-01-20: qty 60, 30d supply, fill #0
  Filled 2022-04-29: qty 60, 30d supply, fill #1

## 2022-01-20 MED ORDER — OMEPRAZOLE 40 MG PO CPDR
40.0000 mg | DELAYED_RELEASE_CAPSULE | Freq: Every morning | ORAL | 5 refills | Status: AC
  Filled 2022-01-20: qty 30, 30d supply, fill #0
  Filled 2022-02-26: qty 30, 30d supply, fill #1
  Filled 2022-03-30: qty 30, 30d supply, fill #2
  Filled 2022-05-04: qty 30, 30d supply, fill #3
  Filled 2022-06-03: qty 30, 30d supply, fill #4

## 2022-01-20 MED ORDER — AMOXICILLIN-POT CLAVULANATE 875-125 MG PO TABS
1.0000 | ORAL_TABLET | Freq: Two times a day (BID) | ORAL | 0 refills | Status: AC
  Filled 2022-01-20: qty 20, 10d supply, fill #0

## 2022-01-20 MED ORDER — METHYLPREDNISOLONE ACETATE 80 MG/ML IJ SUSP
80.0000 mg | Freq: Once | INTRAMUSCULAR | Status: AC
Start: 2022-01-20 — End: 2022-01-20
  Administered 2022-01-20: 80 mg via INTRAMUSCULAR

## 2022-01-20 NOTE — Patient Instructions (Addendum)
? ?  1. Continue Flovent 250 - 1 inhalation 2 times per day ? ?2. Continue Singulair 10 mg daily   ? ?3. Continue to Treat reflux: ? ? A. Omeprazole '40mg'$  1 time per day ? B. Famotidine 40 mg in PM ? ?4. Continue Nasal saline, Mucinex DM, Claritin, Nasacort, and ProAir HFA if needed ? ?5. For this recent episode: ? ? A. Depomedrol 80 IM delivered in clinic today ? B. Augmentin 875 - 1 tablet 2 times per day for 10 days ? ?6. Return to clinic in 6 months or earlier if problem ? ?

## 2022-01-20 NOTE — Progress Notes (Signed)
? ?[name] ? ? ?Follow-up Note ? ?Referring Provider: Alroy Dust, L.Marlou Sa, MD ?Primary Provider: Alroy Dust, Carlean Jews.Marlou Sa, MD ?Date of Office Visit: 01/20/2022 ? ?Subjective:  ? ?Tony Roberts (DOB: 1940/11/06) is a 81 y.o. male who returns to the [facility] on 01/20/2022 in re-evaluation of the following: ? ?HPI: Tony Roberts returns to this clinic in evaluation of asthma, allergic rhinitis, and reflux.  His last visit to this clinic was 01 July 2021. ? ?He was doing very well since his last visit while using inhaled Flovent, nasal steroid, montelukast, and therapy directed against reflux.  He rarely used any short acting bronchodilator and he could exert himself to the extent that his musculoskeletal issues allow him to do so without much difficulty and he did not require systemic steroid or an antibiotic for any type of airway issue.  And his reflux was under very good control on his current plan ? ?Unfortunately, about 10 days ago he developed acute onset of drainage in his throat and feeling as though he had a "cold" and just feeling awful and about 7 days ago he developed onset of coughing and has been coughing like crazy and he feels really weak and he thinks that he has some green to yellow-tinged phlegm and he had a fever last night of 99.3.  At this point his head is actually doing quite well and he does not have any nasal stuffiness or ugly nasal discharge.  He does not have any anosmia. ? 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  He continues to use amiodarone to treat his atrial fibrillation which appears to be in normal sinus rhythm over the course of the past several months. ? ?Allergies as of 01/20/2022   ? ?   Reactions  ? Eliquis [apixaban] Other (See Comments)  ? Eliquis failure: stroke on Eliquis  ?  Lisinopril   ? Other reaction(s): headache  ? ?  ? ?  ?Medication List  ? ? ?acetaminophen 325 MG tablet ?Commonly known as: TYLENOL ?Take 2 tablets (650 mg total) by mouth every 4 (four) hours as needed for mild pain (or temp > 37.5 C (99.5 F)). ?  ?albuterol 108 (90 Base) MCG/ACT inhaler ?Commonly known as: Proventil HFA ?Inhale two puffs every four to six hours as needed for cough or wheeze. ?  ?amiodarone 200 MG tablet ?Commonly known as: PACERONE ?Take 1 tablet (200 mg total) by mouth daily. ?  ?amLODipine 5 MG tablet ?Commonly known as: NORVASC ?Take 1 tablet (5 mg total) by mouth daily. ?  ?atorvastatin 20 MG tablet ?Commonly known as: LIPITOR ?Take 1 tablet (20 mg total) by mouth daily. ?  ?dabigatran 150 MG Caps capsule ?Commonly known as: PRADAXA ?Take 1 capsule (150 mg total) by mouth every 12 (twelve) hours. ?  ?esomeprazole 40 MG capsule ?Commonly known as: [place] ?Take 1 capsule (40 mg total) by mouth daily. ?  ?famotidine 40 MG tablet ?Commonly known as: PEPCID ?Take 1 tablet (40 mg total) by mouth at bedtime. ?What changed: when to take this ?  ?Flovent Diskus 250 MCG/ACT Aepb ?Generic drug: Fluticasone Propionate (Inhal) ?Inhale 1 puff into the lungs in the morning and at bedtime. ?  ?levothyroxine 50 MCG tablet ?Commonly known as: SYNTHROID ?Take 1 tablet (50 mcg total) by mouth in  the morning on an empty stomach ?  ?losartan 25 MG tablet ?Commonly known as: COZAAR ?Take 0.5 tablets (12.5 mg total) by mouth daily. ?  ?montelukast 10 MG tablet ?Commonly known as: SINGULAIR ?Take 1 tablet (10 mg total) by mouth at bedtime. ?  ?omeprazole 40 MG capsule ?Commonly known as: PRILOSEC ?Take 40 mg by mouth daily. ?  ?potassium chloride SA 20 MEQ tablet ?Commonly known as: KLOR-CON M ?Take 1 tablet (20 mEq total) by mouth daily. ?  ?triamcinolone 55 MCG/ACT Aero nasal inhaler ?Commonly known as: NASACORT ?Place 2 sprays into the nose daily as needed (for allergies or rhinitis). ?  ? ?Past Medical History:   ?Diagnosis Date  ? Acquired dilation of ascending aorta and aortic root (HCC)   ? 52m by echo 2022 and 443mby cardiac MRI 03/2021  ? Aortic insufficiency   ? mild by cardiac MRI 03/2021  ? Arthritis   ? Asthma   ? last asthma attack at age 89? [place]ilated [place] ([facility]  ? likely tachy mediated from atrial fibrillation.  LVF normalized on echo 01/2021  ? GERD (gastroesophageal reflux disease)   ? Headache   ? Mitral regurgitation   ? mild by cardiac MRI 03/2021  ? Other specified iron deficiency anemias   ? PAC (premature atrial contraction)   ? occasional PAC's  ? PAF (paroxysmal atrial fibrillation) ([facility]  ? s/p TEE/DCCV 08/2020  ? TIA (transient ischemic attack)   ? 3 TIAs in last 1 month.  Each episode last about 30 minutes, symptoms was one-sided numbness and facial droop and speech problems. MRI 08/2020 showed A tiny chronic infarct   ? ? ?Past Surgical History:  ?Procedure Laterality Date  ? CARDIOVERSION N/A 08/27/2020  ? Procedure: CARDIOVERSION;  Surgeon: NeDorothy SparkMD;  [facility];  Service: Cardiovascular;  Laterality: N/A;  ? CATARACT EXTRACTION    ? HERNIA REPAIR    ? SHOULDER CLOSED REDUCTION Left 08/23/2020  ? Procedure: CLOSED REDUCTION SHOULDER;  Surgeon: XuLeandrew KoyanagiMD;  [facility] Service: [facility]ics;  Laterality: Left;  ? SPINE SURGERY  10/29/2016  ? TEE WITHOUT CARDIOVERSION N/A 08/27/2020  ? Procedure: TRANSESOPHAGEAL ECHOCARDIOGRAM (TEE);  Surgeon: NeDorothy SparkMD;  [facility];  Service: Cardiovascular;  Laterality: N/A;  ? TONSILLECTOMY    ? ? ?Review of systems negative except as noted in HPI / PMHx or noted below: ? ?Review of Systems  ?Constitutional: Negative.   ?HENT: Negative.    ?Eyes: Negative.   ?Respiratory: Negative.    ?Cardiovascular: Negative.   ?Gastrointestinal: Negative.   ?Genitourinary: Negative.   ?Musculoskeletal: Negative.   ?Skin: Negative.   ?Neurological: Negative.   ?Endo/Heme/Allergies: Negative.    ?Psychiatric/Behavioral: Negative.    ? ? ?Objective:  ? ?Vitals:  ? 01/20/22 1127  ?BP: 112/68  ?Pulse: 63  ?Resp: 18  ?Temp: 98.1 ?F (36.7 ?C)  ?SpO2: 95%  ? ?Height: '5\' 6"'$  (167.6 cm)  ?Weight: 176 lb 12.8 oz (80.2 kg)  ? ?Physical Exam ?Constitutional:   ?   Appearance: He is not diaphoretic.  ?HENT:  ?   Head: Normocephalic.  ?   Right Ear: Tympanic membrane, ear canal and external ear normal.  ?   Left Ear: Tympanic membrane, ear canal and external ear normal.  ?   Nose: Nose normal. No mucosal edema or rhinorrhea.  ?   Mouth/Throat:  ?   Pharynx: Uvula midline. No oropharyngeal exudate.  ?Eyes:  ?  Conjunctiva/sclera: Conjunctivae normal.  ?Neck:  ?   Thyroid: No thyromegaly.  ?   Trachea: Trachea normal. No tracheal tenderness or tracheal deviation.  ?Cardiovascular:  ?   Rate and Rhythm: Normal rate and regular rhythm.  ?   Heart sounds: Normal heart sounds, S1 normal and S2 normal. No murmur heard. ?Pulmonary:  ?   Effort: No respiratory distress.  ?   Breath sounds: Normal breath sounds. No stridor. No wheezing or rales.  ?Lymphadenopathy:  ?   Head:  ?   Right side of head: No tonsillar adenopathy.  ?   Left side of head: No tonsillar adenopathy.  ?   Cervical: No cervical adenopathy.  ?Skin: ?   Findings: No erythema or rash.  ?   Nails: There is no clubbing.  ?Neurological:  ?   Mental Status: He is alert.  ? ? ?Diagnostics:  ?  ?Spirometry was performed and demonstrated an FEV1 of 1.62 at 65 % of predicted. ? ?Assessment and Plan:  ? ?1. Not well controlled moderate persistent asthma   ?2. Other allergic rhinitis   ?3. LPRD (laryngopharyngeal reflux disease)   ? ? ?1. Continue Flovent 250 - 1 inhalation 2 times per day ? ?2. Continue Singulair 10 mg daily   ? ?3. Continue to Treat reflux: ? ? A. Omeprazole '40mg'$  1 time per day ? B. Famotidine 40 mg in PM ? ?4. Continue Nasal saline, Mucinex DM, Claritin, Nasacort, and ProAir HFA if needed ? ?5. For this recent episode: ? ? A. Depomedrol 80 IM  delivered in clinic today ? B. Augmentin 875 - 1 tablet 2 times per day for 10 days ? ?6. Return to clinic in 6 months or earlier if problem ? ?Tony Roberts has had acute onset of a respiratory tract infection and he has not improved ove

## 2022-01-21 ENCOUNTER — Encounter: Payer: Self-pay | Admitting: Allergy and Immunology

## 2022-01-23 ENCOUNTER — Other Ambulatory Visit (HOSPITAL_COMMUNITY): Payer: Self-pay

## 2022-02-02 ENCOUNTER — Other Ambulatory Visit (HOSPITAL_COMMUNITY): Payer: Self-pay

## 2022-02-02 ENCOUNTER — Other Ambulatory Visit: Payer: Self-pay | Admitting: Allergy and Immunology

## 2022-02-02 MED ORDER — MONTELUKAST SODIUM 10 MG PO TABS
10.0000 mg | ORAL_TABLET | Freq: Every day | ORAL | 1 refills | Status: AC
Start: 2022-02-02 — End: ?
  Filled 2022-02-02: qty 90, 90d supply, fill #0
  Filled 2022-05-12: qty 90, 90d supply, fill #1

## 2022-02-03 ENCOUNTER — Other Ambulatory Visit (HOSPITAL_COMMUNITY): Payer: Self-pay

## 2022-02-17 ENCOUNTER — Other Ambulatory Visit: Payer: Self-pay | Admitting: Cardiology

## 2022-02-17 ENCOUNTER — Other Ambulatory Visit (HOSPITAL_COMMUNITY): Payer: Self-pay

## 2022-02-17 DIAGNOSIS — I480 Paroxysmal atrial fibrillation: Secondary | ICD-10-CM

## 2022-02-17 DIAGNOSIS — I4729 Other ventricular tachycardia: Secondary | ICD-10-CM

## 2022-02-17 DIAGNOSIS — I5022 Chronic systolic (congestive) heart failure: Secondary | ICD-10-CM

## 2022-02-17 DIAGNOSIS — G459 Transient cerebral ischemic attack, unspecified: Secondary | ICD-10-CM

## 2022-02-17 DIAGNOSIS — R079 Chest pain, unspecified: Secondary | ICD-10-CM

## 2022-02-18 ENCOUNTER — Other Ambulatory Visit (HOSPITAL_COMMUNITY): Payer: Self-pay

## 2022-02-19 ENCOUNTER — Other Ambulatory Visit (HOSPITAL_COMMUNITY): Payer: Self-pay

## 2022-02-19 MED ORDER — LOSARTAN POTASSIUM 25 MG PO TABS
12.5000 mg | ORAL_TABLET | Freq: Every day | ORAL | 1 refills | Status: AC
  Filled 2022-02-19: qty 45, 90d supply, fill #0
  Filled 2022-05-27: qty 45, 90d supply, fill #1

## 2022-02-26 ENCOUNTER — Other Ambulatory Visit (HOSPITAL_COMMUNITY): Payer: Self-pay

## 2022-03-02 ENCOUNTER — Ambulatory Visit: Payer: Medicare Other | Admitting: Cardiology

## 2022-03-04 ENCOUNTER — Other Ambulatory Visit (HOSPITAL_COMMUNITY): Payer: Self-pay

## 2022-03-06 DIAGNOSIS — Z961 Presence of intraocular lens: Secondary | ICD-10-CM | POA: Diagnosis not present

## 2022-03-06 DIAGNOSIS — H35443 Age-related reticular degeneration of retina, bilateral: Secondary | ICD-10-CM | POA: Diagnosis not present

## 2022-03-06 DIAGNOSIS — H35033 Hypertensive retinopathy, bilateral: Secondary | ICD-10-CM | POA: Diagnosis not present

## 2022-03-06 DIAGNOSIS — H04123 Dry eye syndrome of bilateral lacrimal glands: Secondary | ICD-10-CM | POA: Diagnosis not present

## 2022-03-12 ENCOUNTER — Ambulatory Visit: Payer: Medicare Other | Admitting: Adult Health

## 2022-03-12 ENCOUNTER — Encounter: Payer: Self-pay | Admitting: Adult Health

## 2022-03-12 VITALS — BP 116/64 | HR 61 | Ht 66.0 in | Wt 174.8 lb

## 2022-03-12 DIAGNOSIS — I69398 Other sequelae of cerebral infarction: Secondary | ICD-10-CM

## 2022-03-12 DIAGNOSIS — R2689 Other abnormalities of gait and mobility: Secondary | ICD-10-CM

## 2022-03-12 DIAGNOSIS — R29898 Other symptoms and signs involving the musculoskeletal system: Secondary | ICD-10-CM

## 2022-03-12 DIAGNOSIS — I610 Nontraumatic intracerebral hemorrhage in hemisphere, subcortical: Secondary | ICD-10-CM | POA: Diagnosis not present

## 2022-03-12 NOTE — Progress Notes (Signed)
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 [place] [address] [phone]       OFFICE FOLLOW-UP NOTE  Mr. Tony Roberts Date of Birth:  06-24-1941 Medical Record Number:  [id number]    Primary neurologist: Dr. Leonie Roberts Reason for visit: Stroke follow-up  Chief Complaint  Patient presents with   Thalamic hemorrhage    Rm 2, 3 month FU   "sometimes my BP gets really low, I am very fatigued, bruising easily, I have a little limp now in left leg which made me quit bowling"        HPI:   Update 03/12/2022 JM: Patient returns for 81-monthstroke follow-up.  Overall stable from stroke standpoint.  Denies new stroke/TIA symptoms. Continues to have imbalance and LLE weakness with numbness since his stroke. He does have chronic LLE numbness distally from prior back procedure but numbness worsened post stroke. 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 C/o fatigue since his stroke, has been needing to take a nap daily and has less endurance. Reports sleeps well, denies snoring or witnessed apneas. He does note fatigue can worsen with low blood pressure readings.  Blood pressure today 116/64 but at times SBP can be 80-90 and he will noticed increased fatigue.  He has remained on Pradaxa but concerned that he bruises easily, he is also following with ophthalmology for recent subconjunctival hemorrhage which they believes was from Pradaxa use, denies any other significant bleeding concerns such as from nose or in urine or stool.  Remains on atorvastatin, denies side effects.  Routinely follows with PCP and cardiology.  No further concerns at this time.    History provided for reference purposes only Initial visit 11/24/2021 Dr. SLeonie Roberts Mr. [name] a 81year old Caucasian male seen today for initial office follow-up visit following hospital admission for intracerebral hemorrhage in December 2022.  History is obtained from the patient and review  of electronic medical records and opossum reviewed pertinent available imaging films in PACS.  He presented on 09/19/2021 with sudden onset of left arm and leg numbness and worsening of existing left leg numbness.  On admission CT scan of the head showed a right thalamic hemorrhage with mild cytotoxic edema but no midline shift intraventricular hemorrhage or hydrocephalus.  Cleviprex was initially used to control his blood pressure but was easily weaned off in a day or 2.  MRI scan of the brain also showed ischemic punctate infarcts involving left temporal lobe and left basal ganglia and stable appearance of the right thalamic hemorrhage with mild cytotoxic  edema.  Eliquis was reversed with Andexxa.  Aspirin and aspirin was started after few days with plan to switch from aspirin to Pradaxa after 10 days.  Patient did well and had no physical deficits except for subjective paresthesias on the left and had no therapy needs at discharge.  He states is done well since discharge.  He is changed from aspirin to Pradaxa which is tolerating well without major bleeding but does get easy bruising.  He has noticed that his walking has been a little bit slow and his balance is off at times and he has to rest and then feels all right.  His left leg continues to have numbness and bothersome.  He does have history of chronic low back pain and had undergone back surgery about 5 years ago he has had some residual leg numbness since then.  He feels his symptoms are not bothersome  enough at the present time to seek any further evaluation.  He has no new complaints today.  Blood pressures well controlled today it is 136/71.  He is tolerating Lipitor well without muscle aches and pains.   ROS:   14 system review of systems is positive for those listed in HPI and all other systems negative  PMH:  Past Medical History:  Diagnosis Date   Acquired dilation of ascending aorta and aortic root (HCC)    61m by echo 2022 and 417mby  cardiac MRI 03/2021   Aortic insufficiency    mild by cardiac MRI 03/2021   Arthritis    Asthma    last asthma attack at age 47 [place]ilated cardiomyopathy ([facility]   likely tachy mediated from atrial fibrillation.  LVF normalized on echo 01/2021   GERD (gastroesophageal reflux disease)    Headache    Mitral regurgitation    mild by cardiac MRI 03/2021   Other specified iron deficiency anemias    PAC (premature atrial contraction)    occasional PAC's   [facility] [facility] [place]) ([facility]   s/p TEE/DCCV 08/2020   TIA (transient ischemic attack)    3 TIAs in last 1 month.  Each episode last about 30 minutes, symptoms was one-sided numbness and facial droop and speech problems. MRI 08/2020 showed A tiny chronic infarct     Social History:  Social History   Socioeconomic History   Marital status: Married    Spouse name: Not on file   Number of children: Not on file   Years of education: Not on file   Highest education level: Not on file  Occupational History   Not on file  Tobacco Use   Smoking status: Never    Passive exposure: Past   Smokeless tobacco: Never  Vaping Use   Vaping Use: Never used  Substance and Sexual Activity   Alcohol use: No   Drug use: No   Sexual activity: Not on file  Other Topics Concern   Not on file  Social History Narrative   Lives with wife   Social Determinants of Health   Financial Resource Strain: Not on file  Food Insecurity: Not on file  Transportation Needs: Not on file  Physical Activity: Not on file  Stress: Not on file  Social Connections: Not on file  Intimate Partner Violence: Not on file    Medications:   Current Outpatient Medications on File Prior to Visit  Medication Sig Dispense Refill   acetaminophen (TYLENOL) 325 MG tablet Take 2 tablets (650 mg total) by mouth every 4 (four) hours as needed for mild pain (or temp > 37.5 C (99.5 F)). 30 tablet 0   albuterol (PROVENTIL HFA) 108 (90 Base) MCG/ACT inhaler Inhale two  puffs every four to six hours as needed for cough or wheeze. (Patient taking differently: 2 puffs every 6 (six) hours as needed for wheezing or shortness of breath.) 1 Inhaler 1   amiodarone (PACERONE) 200 MG tablet Take 1 tablet (200 mg total) by mouth daily. 30 tablet 5   amLODipine (NORVASC) 5 MG tablet Take 1 tablet (5 mg total) by mouth daily. 90 tablet 1   atorvastatin (LIPITOR) 20 MG tablet Take 1 tablet (20 mg total) by mouth daily. 30 tablet 5   dabigatran (PRADAXA) 150 MG CAPS capsule Take 1 capsule (150 mg total) by mouth every 12 (twelve) hours. 60 capsule 3   famotidine (PEPCID) 40 MG tablet Take 1 tablet (40  mg total) by mouth at bedtime. 90 tablet 1   Fluticasone Propionate, Inhal, (FLOVENT DISKUS) 250 MCG/ACT AEPB Inhale 1 puff into the lungs in the morning and at bedtime. 60 each 5   levothyroxine (SYNTHROID) 50 MCG tablet Take 1 tablet (50 mcg total) by mouth in the morning on an empty stomach 30 tablet 12   losartan (COZAAR) 25 MG tablet Take 0.5 tablets (12.5 mg total) by mouth daily. 45 tablet 1   montelukast (SINGULAIR) 10 MG tablet Take 1 tablet (10 mg total) by mouth at bedtime. 90 tablet 1   omeprazole (PRILOSEC) 40 MG capsule Take 1 capsule (40 mg total) by mouth in the morning. 30 capsule 5   potassium chloride SA (KLOR-CON M) 20 MEQ tablet Take 1 tablet (20 mEq total) by mouth daily. 90 tablet 3   triamcinolone (NASACORT) 55 MCG/ACT AERO nasal inhaler Place 2 sprays into the nose daily as needed (for allergies or rhinitis). 16.5 g 5   No current facility-administered medications on file prior to visit.    Allergies:   Allergies  Allergen Reactions   Eliquis [Apixaban] Other (See Comments)    Eliquis failure: stroke on Eliquis   Lisinopril     Other reaction(s): headache    Physical Exam Today's Vitals   03/12/22 1102  BP: 116/64  Pulse: 61  Weight: 174 lb 12.8 oz (79.3 kg)  Height: '5\' 6"'$  (1.676 m)   Body mass index is 28.21 kg/m.  General: well  developed, well nourished very pleasant elderly Caucasian male, seated, in no evident distress Head: head normocephalic and atraumatic.  Neck: supple with no carotid or supraclavicular bruits Cardiovascular: regular rate and rhythm, no murmurs Musculoskeletal: no deformity Skin:  no rash/petichiae Vascular:  Normal pulses all extremities  Neurologic Exam Mental Status: Awake and fully alert.  Fluent speech and language.  Oriented to place and time. Recent and remote memory intact. Attention span, concentration and fund of knowledge appropriate. Mood and affect appropriate.  Cranial Nerves: Pupils equal, briskly reactive to light. Extraocular movements full without nystagmus. Visual fields full to confrontation. Hearing intact. Facial sensation intact. Face, tongue, palate moves normally and symmetrically.  Motor: Normal bulk and tone. Normal strength in all tested extremity muscles except slight left HF weakness Sensory.: intact to touch ,pinprick .position and vibratory sensation.  Coordination: Rapid alternating movements normal in all extremities. Finger-to-nose and heel-to-shin performed accurately bilaterally. Gait and Station: Arises from chair without difficulty. Stance is normal. Gait demonstrates slightly decreased stride length and step height of LLE, leans towards right, mild unsteadiness, moderate difficulty with tandem walking heel toe Reflexes: 1+ and symmetric. Toes downgoing.       ASSESSMENT/PLAN: KELTIN BAIRD is a 81 year old Caucasian male with a right thalamic parenchymal intracerebral hemorrhage in 08/2021 secondary to Eliquis usage for atrial fibrillation along with small punctate ischemic and infarcts in the left  temporal lobe and basal ganglia of cardioembolic etiology from A-fib.  Eliquis effect was reversed with Andexxa.  Patient is doing well although does still have residual imbalance and LLE weakness/numbness which has been persistent since his  stroke    -Referral placed to neuro rehab PT -Continue Pradaxa (dabigatran) 150 mg twice a day and atorvastatin 20 mg daily for secondary stroke prevention -continue close PCP follow-up for aggressive stroke risk factor management, maintain strict control of hypertension with blood pressure goal below 130/90 and lipids with LDL cholesterol goal below 70 mg/dL.  -Did discuss possible underlying sleep apnea contributing to daytime fatigue.  He declines interest in further evaluation at this time but further education provided regarding risks of untreated sleep apnea and was advised to call office if interested in pursuing in the future -Continue to follow with cardiology as scheduled   Overall stable from stroke standpoint without further recommendations.  He will continue to follow with PCP and cardiology for aggressive stroke risk factor management.  Advised to call office with any questions or concerns in the future   CC:  Mitchell, L.Marlou Sa, MD   I spent 34 minutes of face-to-face and non-face-to-face time with patient.  This included previsit chart review, lab review, study review, order entry, electronic health record documentation, patient education and discussion regarding history of prior stroke with residual deficits, secondary stroke prevention measures and aggressive stroke risk factor management, other concerns as noted above and answered all other questions to patient satisfaction  Frann Rider, AGNP-BC  [facility] [address] [place], [place] [address]  Phone [phone] Fax [phone] Note: This document was prepared with digital dictation and possible smart phrase technology. Any transcriptional errors that result from this process are unintentional.

## 2022-03-12 NOTE — Patient Instructions (Addendum)
Recommended starting physical therapy for continued imbalance and left leg weakness - if you do not hear from them by mid next week, please call them to schedule  Further consider sleep evaluation for possible underling sleep apnea - if you would like to pursue further evaluation please let us know  Continue Pradaxa (dabigatran) twice a day  and atorvastatin '20mg'$  daily  for secondary stroke prevention  Continue to follow up with PCP regarding cholesterol and blood pressure management  Maintain strict control of hypertension with blood pressure goal below 130/90 and cholesterol with LDL cholesterol (bad cholesterol) goal below 70 mg/dL.   Signs of a Stroke? Follow the BEFAST method:  Balance Watch for a sudden loss of balance, trouble with coordination or vertigo Eyes Is there a sudden loss of vision in one or both eyes? Or double vision?  Face: Ask the person to smile. Does one side of the face droop or is it numb?  Arms: Ask the person to raise both arms. Does one arm drift downward? Is there weakness or numbness of a leg? Speech: Ask the person to repeat a simple phrase. Does the speech sound slurred/strange? Is the person confused ? Time: If you observe any of these signs, call 911.        Thank you for coming to see [place] at [facility]. I hope we have been able to provide you high quality care today.  You may receive a patient satisfaction survey over the next few weeks. We would appreciate your feedback and comments so that we may continue to improve ourselves and the health of our patients.

## 2022-03-16 ENCOUNTER — Ambulatory Visit: Payer: Medicare Other | Attending: Adult Health | Admitting: Physical Therapy

## 2022-03-16 ENCOUNTER — Encounter: Payer: Self-pay | Admitting: Physical Therapy

## 2022-03-16 VITALS — BP 124/68

## 2022-03-16 DIAGNOSIS — I69398 Other sequelae of cerebral infarction: Secondary | ICD-10-CM | POA: Diagnosis not present

## 2022-03-16 DIAGNOSIS — R2681 Unsteadiness on feet: Secondary | ICD-10-CM | POA: Insufficient documentation

## 2022-03-16 DIAGNOSIS — I610 Nontraumatic intracerebral hemorrhage in hemisphere, subcortical: Secondary | ICD-10-CM | POA: Diagnosis not present

## 2022-03-16 DIAGNOSIS — R29898 Other symptoms and signs involving the musculoskeletal system: Secondary | ICD-10-CM | POA: Insufficient documentation

## 2022-03-16 DIAGNOSIS — M6281 Muscle weakness (generalized): Secondary | ICD-10-CM | POA: Insufficient documentation

## 2022-03-16 DIAGNOSIS — R2689 Other abnormalities of gait and mobility: Secondary | ICD-10-CM | POA: Insufficient documentation

## 2022-03-16 DIAGNOSIS — I69354 Hemiplegia and hemiparesis following cerebral infarction affecting left non-dominant side: Secondary | ICD-10-CM | POA: Insufficient documentation

## 2022-03-30 ENCOUNTER — Other Ambulatory Visit (HOSPITAL_COMMUNITY): Payer: Self-pay

## 2022-03-30 DIAGNOSIS — I10 Essential (primary) hypertension: Secondary | ICD-10-CM | POA: Diagnosis not present

## 2022-03-30 DIAGNOSIS — Z0000 Encounter for general adult medical examination without abnormal findings: Secondary | ICD-10-CM | POA: Diagnosis not present

## 2022-03-30 DIAGNOSIS — K219 Gastro-esophageal reflux disease without esophagitis: Secondary | ICD-10-CM | POA: Diagnosis not present

## 2022-03-30 DIAGNOSIS — E039 Hypothyroidism, unspecified: Secondary | ICD-10-CM | POA: Diagnosis not present

## 2022-03-30 DIAGNOSIS — E7800 Pure hypercholesterolemia, unspecified: Secondary | ICD-10-CM | POA: Diagnosis not present

## 2022-03-30 MED ORDER — LEVOTHYROXINE SODIUM 50 MCG PO TABS
50.0000 ug | ORAL_TABLET | Freq: Every morning | ORAL | 0 refills | Status: DC
  Filled 2022-03-30: qty 30, 30d supply, fill #0

## 2022-04-06 ENCOUNTER — Other Ambulatory Visit: Payer: Self-pay | Admitting: Neurology

## 2022-04-06 ENCOUNTER — Other Ambulatory Visit (HOSPITAL_COMMUNITY): Payer: Self-pay

## 2022-04-06 MED ORDER — DABIGATRAN ETEXILATE MESYLATE 150 MG PO CAPS
150.0000 mg | ORAL_CAPSULE | Freq: Two times a day (BID) | ORAL | 3 refills | Status: AC
Start: 2022-04-06 — End: ?
  Filled 2022-04-06: qty 60, 30d supply, fill #0
  Filled 2022-05-10: qty 60, 30d supply, fill #1

## 2022-04-23 ENCOUNTER — Other Ambulatory Visit (HOSPITAL_COMMUNITY): Payer: Self-pay

## 2022-04-29 ENCOUNTER — Other Ambulatory Visit (HOSPITAL_COMMUNITY): Payer: Self-pay

## 2022-04-29 MED ORDER — LEVOTHYROXINE SODIUM 50 MCG PO TABS
50.0000 ug | ORAL_TABLET | Freq: Every morning | ORAL | 5 refills | Status: AC
  Filled 2022-04-29: qty 30, 30d supply, fill #0
  Filled 2022-06-03: qty 30, 30d supply, fill #1

## 2022-05-04 ENCOUNTER — Other Ambulatory Visit (HOSPITAL_COMMUNITY): Payer: Self-pay

## 2022-05-05 ENCOUNTER — Other Ambulatory Visit (HOSPITAL_COMMUNITY): Payer: Self-pay

## 2022-05-05 MED ORDER — AMIODARONE HCL 200 MG PO TABS
200.0000 mg | ORAL_TABLET | Freq: Every day | ORAL | 5 refills | Status: AC
  Filled 2022-05-05: qty 30, 30d supply, fill #0

## 2022-05-11 ENCOUNTER — Other Ambulatory Visit (HOSPITAL_COMMUNITY): Payer: Self-pay

## 2022-05-12 ENCOUNTER — Other Ambulatory Visit (HOSPITAL_COMMUNITY): Payer: Self-pay

## 2022-05-14 ENCOUNTER — Ambulatory Visit: Payer: Medicare Other | Admitting: Cardiology

## 2022-05-14 ENCOUNTER — Encounter: Payer: Self-pay | Admitting: Cardiology

## 2022-05-14 VITALS — BP 128/66 | HR 58 | Ht 66.0 in | Wt 179.8 lb

## 2022-05-14 DIAGNOSIS — I5022 Chronic systolic (congestive) heart failure: Secondary | ICD-10-CM | POA: Diagnosis not present

## 2022-05-14 DIAGNOSIS — I480 Paroxysmal atrial fibrillation: Secondary | ICD-10-CM | POA: Diagnosis not present

## 2022-05-14 DIAGNOSIS — G459 Transient cerebral ischemic attack, unspecified: Secondary | ICD-10-CM

## 2022-05-14 DIAGNOSIS — I4729 Other ventricular tachycardia: Secondary | ICD-10-CM | POA: Diagnosis not present

## 2022-05-14 DIAGNOSIS — R079 Chest pain, unspecified: Secondary | ICD-10-CM

## 2022-05-14 DIAGNOSIS — G4719 Other hypersomnia: Secondary | ICD-10-CM

## 2022-05-14 NOTE — Addendum Note (Signed)
Addended by: Gaetano Net on: 05/14/2022 10:55 AM   Modules accepted: Orders

## 2022-05-14 NOTE — Patient Instructions (Signed)
Medication Instructions:  Your physician recommends that you continue on your current medications as directed. Please refer to the Current Medication list given to you today. *If you need a refill on your cardiac medications before your next appointment, please call your pharmacy*   Lab Work: None ordered  If you have labs (blood work) drawn today and your tests are completely normal, you will receive your results only by: [facility] (if you have MyChart) OR A paper copy in the mail If you have any lab test that is abnormal or we need to change your treatment, we will call you to review the results.   Testing/Procedures: Your physician has recommended that you have a sleep study. This test records several body functions during sleep, including: brain activity, eye movement, oxygen and carbon dioxide blood levels, heart rate and rhythm, breathing rate and rhythm, the flow of air through your mouth and nose, snoring, body muscle movements, and chest and belly movement.   Follow-Up: At [facility], you and your health needs are our priority.  As part of our continuing mission to provide you with exceptional heart care, we have created designated Provider Care Teams.  These Care Teams include your primary Cardiologist (physician) and Advanced Practice Providers (APPs -  Physician Assistants and Nurse Practitioners) who all work together to provide you with the care you need, when you need it.  We recommend signing up for the patient portal called "MyChart".  Sign up information is provided on this After Visit Summary.  MyChart is used to connect with patients for Virtual Visits (Telemedicine).  Patients are able to view lab/test results, encounter notes, upcoming appointments, etc.  Non-urgent messages can be sent to your provider as well.   To learn more about what you can do with MyChart, go to [email or web address].    Your next appointment:   1 year(s)  The format for your next  appointment:   In Person  Provider:   Fransico Him, MD     Other Instructions   Important Information About Sugar

## 2022-05-14 NOTE — Progress Notes (Signed)
Cardiology Office Note:    Date:  05/14/2022   ID:  Tony Roberts, DOB Apr 24, 1941, MRN [id number]  PCP:  Tony Roberts.Tony Sa, MD  Cardiologist:  Tony Him, MD    Referring MD: Tony Roberts, Tony Roberts.Tony Sa, MD   Chief Complaint  Patient presents with   Atrial Fibrillation   Congestive Heart Failure     History of Present Illness:    Tony Roberts is a 81 y.o. male with a hx of HTN, GERD, asthma, & diverticulosis. He was hospitalized in early Dec 2022 with 4-5 day hx of palpitations, DOE, cough and was found to be in afib with RVR by his allergist and sent to the ER.  He also has had 3 TIAs, each lasting 30 min with one-sided numbness and facial droop with speech problems and spontaneously resolved.  MRI done 11/15 /21 showed no acute CVA but small chronic infarct in the left cerebellum.  He underwent TEE/DCCV to NSR and transitioned to PO Amio '200mg'$  daily.  He was continued on Toprol and started on Eliquis '5mg'$  BID.  2D echo 08/22/2020 showed severe LV dysfunction with EF 25-30% felt to possibly be due to tachy mediated DCM with  LVF with severe LAE and mild AR by TTE.  TEE showed a small PFO with L>R shunting and moderate AR.   He was seen back by Tony Merle, NP and was doing well.  Maintaining sinus but HR slow in the 40's and Toprol was cut back to '50mg'$  daily.  At [place] with me in Jan 2022 and was found to be back in afib and was referred to afib clinic.  Apparently the he day after I saw Roberts he was noted to have a drop in his HR from 90's to 50's and was in SR in afib clinic.  He is not an afib candidate due to severe LAE and it was felt that Amio or Tikosyn would help maintain NSR due to how enlarged the lA was.  It was decided to keep Roberts on Amio '200mg'$  daily.  At at his visit in April 2022 with me he was complaining of chest pain and a lexiscan myoview showed no ischemia. His Toprol was decreased to '25mg'$  daily due to bradycardia.  2D echo showed normalization of LVF with EF 60-65%, severe LAE and  mild MR/AR and dilated ascending aorta at 34m.  cMRI 02/2021 showed normal LVF with EF 60%, mild ASSH, normal RV, mild AR and MR and mildly dilated ascending aorta at 473m   He is here today for followup and is doing well.  His main complaint is that he is VERY fatigued and has limited his activity.  He feels tired when he wakes up and then has to take a nap.  He denies any chest pain or pressure, SOB, DOE, PND, orthopnea, LE edema, dizziness, palpitations or syncope. He is compliant with his meds and is tolerating meds with no SE.    Past Medical History:  Diagnosis Date   [place] of ascending aorta and aortic root [place]   4351my echo 2022 and 51m76m cardiac MRI 03/2021   Aortic insufficiency    mild by cardiac MRI 03/2021   Arthritis    Asthma    last asthma attack at age 16  82ilated cardiomyopathy (HCC)[place] likely tachy mediated from atrial fibrillation.  LVF normalized on echo 01/2021   GERD (gastroesophageal reflux disease)    Headache    Mitral regurgitation  mild by cardiac MRI 03/2021   Other specified iron deficiency anemias    PAC (premature atrial contraction)    occasional PAC's   PAF (paroxysmal atrial fibrillation) ([place])    s/p TEE/DCCV 08/2020   TIA (transient ischemic attack)    3 TIAs in last 1 month.  Each episode last about 30 minutes, symptoms was one-sided numbness and facial droop and speech problems. MRI 08/2020 showed A tiny chronic infarct     Past Surgical History:  Procedure Laterality Date   CARDIOVERSION N/A 08/27/2020   Procedure: CARDIOVERSION;  Surgeon: Dorothy Spark, MD;  Location: [facility] ENDOSCOPY;  Service: Cardiovascular;  Laterality: N/A;   CATARACT EXTRACTION     HERNIA REPAIR     SHOULDER CLOSED REDUCTION Left 08/23/2020   Procedure: CLOSED REDUCTION SHOULDER;  Surgeon: [name], MD;  Location: [place];  Service: Orthopedics;  Laterality: Left;   SPINE SURGERY  10/29/2016   TEE WITHOUT CARDIOVERSION N/A 08/27/2020   Procedure:  TRANSESOPHAGEAL ECHOCARDIOGRAM (TEE);  Surgeon: Dorothy Spark, MD;  Location: [facility] ENDOSCOPY;  Service: Cardiovascular;  Laterality: N/A;   TONSILLECTOMY      Current Medications: Current Meds  Medication Sig   acetaminophen (TYLENOL) 325 MG tablet Take 2 tablets (650 mg total) by mouth every 4 (four) hours as needed for mild pain (or temp > 37.5 C (99.5 F)).   albuterol (PROVENTIL HFA) 108 (90 Base) MCG/ACT inhaler Inhale two puffs every four to six hours as needed for cough or wheeze. (Patient taking differently: 2 puffs every 6 (six) hours as needed for wheezing or shortness of breath.)   amiodarone (PACERONE) 200 MG tablet Take 1 tablet (200 mg total) by mouth daily.   amLODipine (NORVASC) 5 MG tablet Take 5 mg by mouth daily.   atorvastatin (LIPITOR) 20 MG tablet Take 1 tablet (20 mg total) by mouth daily.   dabigatran (PRADAXA) 150 MG CAPS capsule Take 1 capsule (150 mg total) by mouth every 12 (twelve) hours.   famotidine (PEPCID) 40 MG tablet Take 1 tablet (40 mg total) by mouth at bedtime.   Fluticasone Propionate, Inhal, (FLOVENT DISKUS) 250 MCG/ACT AEPB Inhale 1 puff into the lungs in the morning and at bedtime.   levothyroxine (SYNTHROID) 50 MCG tablet Take 1 tablet (50 mcg total) by mouth in the morning on an empty stomach once daily   losartan (COZAAR) 25 MG tablet Take 0.5 tablets (12.5 mg total) by mouth daily.   montelukast (SINGULAIR) 10 MG tablet Take 1 tablet (10 mg total) by mouth at bedtime.   omeprazole (PRILOSEC) 40 MG capsule Take 1 capsule (40 mg total) by mouth in the morning.   potassium chloride Roberts (KLOR-CON M) 20 MEQ tablet Take 1 tablet (20 mEq total) by mouth daily.   triamcinolone (NASACORT) 55 MCG/ACT AERO nasal inhaler Place 2 sprays into the nose daily as needed (for allergies or rhinitis).   [DISCONTINUED] amLODipine (NORVASC) 5 MG tablet Take 1 tablet (5 mg total) by mouth daily.     Allergies:   Eliquis [apixaban] and Lisinopril   Social History    Socioeconomic History   Marital status: Married    Spouse name: Not on file   Number of children: Not on file   Years of education: Not on file   Highest education level: Not on file  Occupational History   Not on file  Tobacco Use   Smoking status: Never    Passive exposure: Past   Smokeless tobacco: Never  Vaping Use  Vaping Use: Never used  Substance and Sexual Activity   Alcohol use: No   Drug use: No   Sexual activity: Not on file  Other Topics Concern   Not on file  Social History Narrative   Lives with wife   Social Determinants of Health   Financial Resource Strain: Not on file  Food Insecurity: Not on file  Transportation Needs: Not on file  Physical Activity: Not on file  Stress: Not on file  Social Connections: Not on file     Family History: The patient's family history includes Alzheimer's disease in his mother; Asthma in his father.  ROS:   Please see the history of present illness.    ROS  All other systems reviewed and negative.   EKGs/Labs/Other Studies Reviewed:    The following studies were reviewed today:  TEE 08/27/2020 IMPRESSIONS    1. Left ventricular ejection fraction, by estimation, is 20 to 25%. The  left ventricle has severely decreased function. The left ventricle  demonstrates global hypokinesis. The left ventricular internal cavity size  was mildly dilated. Left ventricular  diastolic function could not be evaluated.   2. Right ventricular systolic function is normal. The right ventricular  size is normal.   3. Left atrial size was severely dilated. No left atrial/left atrial  appendage thrombus was detected.   4. Right atrial size was severely dilated.   5. The mitral valve is degenerative. Mild to moderate mitral valve  regurgitation. No evidence of mitral stenosis.   6. The aortic valve is tricuspid. Aortic valve regurgitation is moderate.  No aortic stenosis is present.   7. The inferior vena cava is normal in size  with greater than 50%  respiratory variability, suggesting right atrial pressure of 3 mmHg.   8. Evidence of atrial level shunting detected by color flow Doppler.  There is a small patent foramen ovale with predominantly left to right  shunting across the atrial septum.   2D echo 08/22/2020 IMPRESSIONS    1. Left ventricular ejection fraction, by estimation, is 25 to 30%. The  left ventricle has severely decreased function. The left ventricle  demonstrates global hypokinesis. There is moderate left ventricular  hypertrophy of the basal-septal segment. Left  ventricular diastolic function could not be evaluated.   2. Right ventricular systolic function is normal. The right ventricular  size is normal. Tricuspid regurgitation signal is inadequate for assessing  PA pressure.   3. Left atrial size was severely dilated.   4. Right atrial size was mildly dilated.   5. The mitral valve is normal in structure. Mild mitral valve  regurgitation. No evidence of mitral stenosis.   6. The aortic valve is tricuspid. Aortic valve regurgitation is mild.  Mild aortic valve sclerosis is present, with no evidence of aortic valve  stenosis.   7. Aortic dilatation noted. There is mild dilatation of the ascending  aorta, measuring 40 mm.   8. The inferior vena cava is normal in size with greater than 50%  respiratory variability, suggesting right atrial pressure of 3 mmHg.   EKG:  EKG is not ordered today   Recent Labs: 07/03/2021: TSH 1.160 09/19/2021: Magnesium 2.2 09/20/2021: ALT 11; BUN 9; Creatinine, Ser 1.14; Hemoglobin 11.8; Platelets 143; Potassium 3.8; Sodium 136   Recent Lipid Panel    Component Value Date/Time   CHOL 254 (H) 09/22/2021 1457   TRIG 123 09/22/2021 1457   HDL 37 (L) 09/22/2021 1457   CHOLHDL 6.9 09/22/2021 1457   VLDL  25 09/22/2021 1457   LDLCALC 192 (H) 09/22/2021 1457    CHA2DS2-VASc Score = 6 [CHF History: 1, HTN History: 1, Diabetes History: 0, Stroke History: 2,  Vascular Disease History: 0, Age Score: 2, Gender Score: 0].  Therefore, the patient's annual risk of stroke is 9.7 %.        Physical Exam:    VS:  BP 128/66   Pulse (!) 58   Ht '5\' 6"'$  (1.676 m)   Wt 179 lb 12.8 oz (81.6 kg)   SpO2 98%   BMI 29.02 kg/m     Wt Readings from Last 3 Encounters:  05/14/22 179 lb 12.8 oz (81.6 kg)  03/12/22 174 lb 12.8 oz (79.3 kg)  01/20/22 176 lb 12.8 oz (80.2 kg)    GEN: Well nourished, well developed in no acute distress HEENT: Normal NECK: No JVD; No carotid bruits LYMPHATICS: No lymphadenopathy CARDIAC:RRR, no rubs, gallops.  2/6 SM at RUSB RESPIRATORY:  Clear to auscultation without rales, wheezing or rhonchi  ABDOMEN: Soft, non-tender, non-distended MUSCULOSKELETAL:  No edema; No deformity  SKIN: Warm and dry NEUROLOGIC:  Alert and oriented x 3 PSYCHIATRIC:  Normal affect  ASSESSMENT:    1. Paroxysmal atrial fibrillation (HCC)   2. Chronic systolic heart failure ([place])   3. TIA (transient ischemic attack)   4. NSVT (nonsustained ventricular tachycardia) (HCC)   5. Chest pain of uncertain etiology    PLAN:    In order of problems listed above:  1.  Paroxysmal atrial fibrillation -S/P TEE/DCCV and on Amio -it was felt that due to severe LAE he was not a candidate for afib ablation -he is asymptomatic when in afib and it was recommended to continue Amio since he is rate controlled when he goes into afib -he denies any bleeding problems on DOAC -He is maintaining normal sinus rhythm on exam today and denies any palpitations -Continue prescription drug management with amiodarone 200 mg daily and Pradaxa 150 mg twice daily with as needed refills -No beta locker due to history of bradycardia -I have personally reviewed and interpreted outside labs performed by patient's PCP which showed TSH 2.45, ALT 11, potassium 4.7, serum creatinine 1.2 a on 03/30/2022 and hemoglobin 12.7 on 09/30/2021.  2.  Chronic systolic CHF -EF 70-96% on echo and  presumed tachy mediated -LVF normalized to 60% by echo and cMRI 02/2021 -EF normal on echo 08/2021 -He appears euvolemic on exam today -No beta blocker due to history of bradycardia -Continue prescription drug management with losartan 12.5 mg daily and Amlodipine '5mg'$  daily with PRN refills -spiro was stopped due to soft BP -he has not required any lasix  3.  Hx of TIA -likely related to #1 -continue Pradaxa  4.  NSVT -He denies any palpitations  -currently on Amio for his A-fib and no beta-blocker due to bradycardia in the past   5.  Chest pain -Lexiscan myoview 12/2020 with no ischemia -He has not had any further chest pain  6.  Excessive daytime sleepiness -he feels sleepy when he gets up and then has to take a nap -I will get an in lab PSG to assess for OSA  7.  Heart murmur -I2D echo 12/22 with mild AVSC    Medication Adjustments/Labs and Tests Ordered: Current medicines are reviewed at length with the patient today.  Concerns regarding medicines are outlined above.  No orders of the defined types were placed in this encounter.   No orders of the defined types were placed in  this encounter.   Signed, Tony Him, MD  05/14/2022 10:46 AM    [place]

## 2022-05-27 ENCOUNTER — Other Ambulatory Visit (HOSPITAL_COMMUNITY): Payer: Self-pay

## 2022-05-27 MED ORDER — ATORVASTATIN CALCIUM 20 MG PO TABS
20.0000 mg | ORAL_TABLET | Freq: Every day | ORAL | 1 refills | Status: AC
  Filled 2022-05-27: qty 90, 90d supply, fill #0

## 2022-06-03 ENCOUNTER — Other Ambulatory Visit (HOSPITAL_COMMUNITY): Payer: Self-pay

## 2022-07-30 ENCOUNTER — Other Ambulatory Visit (HOSPITAL_COMMUNITY): Payer: Self-pay

## 2022-10-29 ENCOUNTER — Encounter (HOSPITAL_COMMUNITY): Payer: Self-pay | Admitting: *Deleted
